# Patient Record
Sex: Male | Born: 1945
Health system: Southern US, Community
[De-identification: ages and names within clinical notes are randomized; demographics above are authoritative.]

## PROBLEM LIST (undated history)

## (undated) DIAGNOSIS — I639 Cerebral infarction, unspecified: Secondary | ICD-10-CM

## (undated) DIAGNOSIS — D696 Thrombocytopenia, unspecified: Secondary | ICD-10-CM

## (undated) DIAGNOSIS — K5900 Constipation, unspecified: Secondary | ICD-10-CM

## (undated) DIAGNOSIS — M2560 Stiffness of unspecified joint, not elsewhere classified: Secondary | ICD-10-CM

## (undated) DIAGNOSIS — D649 Anemia, unspecified: Secondary | ICD-10-CM

## (undated) DIAGNOSIS — R9431 Abnormal electrocardiogram [ECG] [EKG]: Secondary | ICD-10-CM

## (undated) DIAGNOSIS — D72819 Decreased white blood cell count, unspecified: Secondary | ICD-10-CM

## (undated) DIAGNOSIS — R768 Other specified abnormal immunological findings in serum: Secondary | ICD-10-CM

## (undated) DIAGNOSIS — M1990 Unspecified osteoarthritis, unspecified site: Secondary | ICD-10-CM

## (undated) DIAGNOSIS — I6350 Cerebral infarction due to unspecified occlusion or stenosis of unspecified cerebral artery: Secondary | ICD-10-CM

## (undated) DIAGNOSIS — I10 Essential (primary) hypertension: Secondary | ICD-10-CM

## (undated) DIAGNOSIS — I5189 Other ill-defined heart diseases: Secondary | ICD-10-CM

## (undated) HISTORY — DX: Cerebral infarction, unspecified: I63.9

## (undated) HISTORY — DX: Essential (primary) hypertension: I10

## (undated) HISTORY — DX: Unspecified osteoarthritis, unspecified site: M19.90

---

## 2004-05-23 ENCOUNTER — Emergency Department (HOSPITAL_COMMUNITY): Admission: EM | Admit: 2004-05-23 | Discharge: 2004-05-23 | Payer: Self-pay | Admitting: Emergency Medicine

## 2004-05-25 ENCOUNTER — Encounter (HOSPITAL_COMMUNITY): Admission: RE | Admit: 2004-05-25 | Discharge: 2004-06-24 | Payer: Self-pay | Admitting: Emergency Medicine

## 2004-06-01 ENCOUNTER — Emergency Department (HOSPITAL_COMMUNITY): Admission: EM | Admit: 2004-06-01 | Discharge: 2004-06-01 | Payer: Self-pay | Admitting: Emergency Medicine

## 2004-06-25 ENCOUNTER — Encounter (HOSPITAL_COMMUNITY): Admission: RE | Admit: 2004-06-25 | Discharge: 2004-07-25 | Payer: Self-pay | Admitting: Emergency Medicine

## 2009-02-17 ENCOUNTER — Emergency Department (HOSPITAL_COMMUNITY): Admission: EM | Admit: 2009-02-17 | Discharge: 2009-02-17 | Payer: Self-pay | Admitting: Emergency Medicine

## 2009-06-26 ENCOUNTER — Emergency Department (HOSPITAL_COMMUNITY): Admission: EM | Admit: 2009-06-26 | Discharge: 2009-06-26 | Payer: Self-pay | Admitting: Emergency Medicine

## 2009-08-14 ENCOUNTER — Emergency Department (HOSPITAL_COMMUNITY): Admission: EM | Admit: 2009-08-14 | Discharge: 2009-08-14 | Payer: Self-pay | Admitting: Emergency Medicine

## 2009-08-24 ENCOUNTER — Emergency Department (HOSPITAL_COMMUNITY): Admission: EM | Admit: 2009-08-24 | Discharge: 2009-08-24 | Payer: Self-pay | Admitting: Emergency Medicine

## 2010-01-04 ENCOUNTER — Ambulatory Visit: Payer: Self-pay | Admitting: Cardiology

## 2010-01-04 ENCOUNTER — Inpatient Hospital Stay (HOSPITAL_COMMUNITY)
Admission: EM | Admit: 2010-01-04 | Discharge: 2010-01-05 | Payer: Self-pay | Source: Home / Self Care | Admitting: Emergency Medicine

## 2010-06-01 ENCOUNTER — Ambulatory Visit: Payer: Self-pay | Admitting: Gastroenterology

## 2010-06-04 ENCOUNTER — Encounter: Payer: Self-pay | Admitting: Gastroenterology

## 2010-06-04 DIAGNOSIS — D649 Anemia, unspecified: Secondary | ICD-10-CM

## 2010-06-04 HISTORY — DX: Anemia, unspecified: D64.9

## 2010-07-04 ENCOUNTER — Ambulatory Visit (HOSPITAL_COMMUNITY)
Admission: RE | Admit: 2010-07-04 | Discharge: 2010-07-04 | Payer: Self-pay | Source: Home / Self Care | Admitting: Gastroenterology

## 2010-07-04 ENCOUNTER — Ambulatory Visit: Payer: Self-pay | Admitting: Gastroenterology

## 2010-09-06 NOTE — Assessment & Plan Note (Signed)
Summary: ANEMIA, Constipation   Visit Type:  Initial Consult Referring Danford Tat:  [facility] Damiyah Ditmars:  [facility]  Chief Complaint:  constipation.  History of Present Illness: Having back pain. BMs: every day, early in the AM. Now resolved. Doesn't eat alot of vegetables and prefers meat. No dysphagia, NV, abd pain, diarrhea, blood in stool, or black tarry stool. Appetite: good. Rare-hb/indigestion. No BC, Goody's, or Alve. May use Ibuprofen as needed for back pain.  Current Medications (verified): 1)  Accuretic 20-12.5 Mg Tabs (Quinapril-Hydrochlorothiazide) .... Take 1 Tablet By Mouth Once A Day 2)  Norvasc 5 Mg Tabs (Amlodipine Besylate) .... Take 1 Tablet By Mouth Once A Day 3)  Celebrex 200 Mg Caps (Celecoxib) .... Take 1 Tablet By Mouth Once A Day 4)  Aspirin 81 Mg Tbec (Aspirin) .... Take 1 Tablet By Mouth Once A Day  Allergies (verified): No Known Drug Allergies  Past History:  Past Medical History: Hypertension Arthritis Stroke  Family History: No FH of Colon Cancer or polyps  Social History: Applying for disablity Married: 2-3 years, but together for  ~40 years Smoke: < 1pk/day No EtOH, quit 2 years ago.  Review of Systems       JAN 2011: 188 LBS  FEB 2011: 183 LBS  JULY 2011: 196 LBS  AUG 2011: TSH 1.882    K 3.8  CA 9.3  ALB 4.0  HB 13.3 MCV 94.9    PLT 208 INR 14.1  CR 1.07 NI HFP  CHOL 175  HGA1C 5.9  PER HPI OTHERWISE ALL SYSTEMS NEGATIVE.  Vital Signs:  Patient profile:   65 year old male Height:      66 inches Weight:      206 pounds BMI:     33.37 Temp:     98.2 degrees F oral Pulse rate:   80 / minute BP sitting:   122 / 72  (left arm) Cuff size:   regular  Vitals Entered By: Cloria Spring LPN (June 01, 2010 1:58 PM)  Physical Exam  General:  Well developed, well nourished, no acute distress. Head:  Normocephalic and atraumatic. Eyes:  PERRL, no icterus. Mouth:  No deformity or lesions. Neck:  Supple; no  masses. Lungs:  Clear throughout to auscultation. Heart:  Regular rate and rhythm; no murmurs. Abdomen:  Soft, nontender and nondistended.  Normal bowel sounds. Extremities:  No edema noted. Neurologic:  Alert and  oriented x4;  grossly normal neurologically.  Impression & Recommendations:  Problem # 1:  ANEMIA (ICD-285.9) Assessment New Normocytic and taking NSAIDS. Differential diagnosis includes H. pylori or NSAID gastritis, colorectal polyp, and doubt PUD or colorectal CA. Pt has no insurance. Apply for [facility] discount. Schedule [facility]/? EGD after discount available. OPV after endo. TRILYTE PREP.  CC: PCP  Orders: Consultation Level III [phone])

## 2010-09-06 NOTE — Letter (Signed)
Summary: [facility] POSS EGD ORDER  [facility] POSS EGD ORDER   Imported By: Ave Filter 06/04/2010 11:18:25  _____________________________________________________________________  External Attachment:    Type:   Image     Comment:   External Document

## 2010-10-16 LAB — FERRITIN: Ferritin: 86 ng/mL (ref 22–322)

## 2010-10-21 LAB — BASIC METABOLIC PANEL
BUN: 10 mg/dL (ref 6–23)
CO2: 26 mEq/L (ref 19–32)
Calcium: 9.1 mg/dL (ref 8.4–10.5)
Chloride: 107 mEq/L (ref 96–112)
Creatinine, Ser: 0.89 mg/dL (ref 0.4–1.5)
GFR calc Af Amer: >60 mL/min (ref >60)
GFR calc non Af Amer: >60 mL/min (ref >60)
Glucose, Bld: 105 mg/dL — ABNORMAL HIGH (ref 70–99)
Potassium: 3.3 mEq/L — ABNORMAL LOW (ref 3.5–5.1)
Sodium: 139 mEq/L (ref 135–145)

## 2010-10-21 LAB — DIFFERENTIAL
Basophils Absolute: 0 10*3/uL (ref 0.0–0.1)
Basophils Relative: 0 % (ref 0–1)
Basophils Relative: 0 % (ref 0–1)
Eosinophils Absolute: 0 10*3/uL (ref 0.0–0.7)
Eosinophils Absolute: 0.1 10*3/uL (ref 0.0–0.7)
Eosinophils Relative: 1 % (ref 0–5)
Eosinophils Relative: 1 % (ref 0–5)
Lymphocytes Relative: 26 % (ref 12–46)
Lymphs Abs: 0.9 10*3/uL (ref 0.7–4.0)
Monocytes Absolute: 0.3 10*3/uL (ref 0.1–1.0)
Monocytes Absolute: 0.3 10*3/uL (ref 0.1–1.0)
Monocytes Relative: 9 % (ref 3–12)
Monocytes Relative: 7 % (ref 3–12)
Neutro Abs: 2.2 10*3/uL (ref 1.7–7.7)
Neutro Abs: 3.8 10*3/uL (ref 1.7–7.7)
Neutrophils Relative %: 63 % (ref 43–77)

## 2010-10-21 LAB — CBC
HCT: 38.6 % — ABNORMAL LOW (ref 39.0–52.0)
HCT: 39.4 % (ref 39.0–52.0)
Hemoglobin: 12.9 g/dL — ABNORMAL LOW (ref 13.0–17.0)
Hemoglobin: 13.3 g/dL (ref 13.0–17.0)
MCHC: 33.6 g/dL (ref 30.0–36.0)
MCHC: 33.7 g/dL (ref 30.0–36.0)
MCV: 91.7 fL (ref 78.0–100.0)
MCV: 92 fL (ref 78.0–100.0)
Platelets: 144 10*3/uL — ABNORMAL LOW (ref 150–400)
Platelets: 123 10*3/uL — ABNORMAL LOW (ref 150–400)
RBC: 4.21 MIL/uL — ABNORMAL LOW (ref 4.22–5.81)
RBC: 4.28 MIL/uL (ref 4.22–5.81)
RDW: 13.3 % (ref 11.5–15.5)
RDW: 13.3 % (ref 11.5–15.5)
WBC: 3.4 10*3/uL — ABNORMAL LOW (ref 4.0–10.5)
WBC: 4.7 10*3/uL (ref 4.0–10.5)

## 2010-10-21 LAB — COMPREHENSIVE METABOLIC PANEL
ALT: 18 U/L (ref 0–53)
AST: 27 U/L (ref 0–37)
Albumin: 3.3 g/dL — ABNORMAL LOW (ref 3.5–5.2)
Alkaline Phosphatase: 55 U/L (ref 39–117)
BUN: 10 mg/dL (ref 6–23)
CO2: 28 mEq/L (ref 19–32)
Calcium: 9.1 mg/dL (ref 8.4–10.5)
Chloride: 104 mEq/L (ref 96–112)
Creatinine, Ser: 1.1 mg/dL (ref 0.4–1.5)
GFR calc Af Amer: >60 mL/min (ref >60)
GFR calc non Af Amer: >60 mL/min (ref >60)
Glucose, Bld: 94 mg/dL (ref 70–99)
Potassium: 3.5 mEq/L (ref 3.5–5.1)
Sodium: 140 mEq/L (ref 135–145)
Total Bilirubin: 0.5 mg/dL (ref 0.3–1.2)
Total Protein: 7.8 g/dL (ref 6.0–8.3)

## 2010-10-21 LAB — URINE MICROSCOPIC-ADD ON

## 2010-10-21 LAB — URINALYSIS, ROUTINE W REFLEX MICROSCOPIC
Bilirubin Urine: NEGATIVE
Glucose, UA: NEGATIVE mg/dL
Ketones, ur: NEGATIVE mg/dL
Leukocytes, UA: NEGATIVE
Nitrite: NEGATIVE
Protein, ur: 30 mg/dL — AB
Specific Gravity, Urine: 1.025 (ref 1.005–1.030)
Urobilinogen, UA: 0.2 mg/dL (ref 0.0–1.0)
pH: 5.5 (ref 5.0–8.0)

## 2010-10-21 LAB — RAPID URINE DRUG SCREEN, HOSP PERFORMED
Amphetamines: NOT DETECTED
Barbiturates: NOT DETECTED
Benzodiazepines: NOT DETECTED
Cocaine: NOT DETECTED
Opiates: NOT DETECTED
Tetrahydrocannabinol: POSITIVE — AB

## 2010-10-21 LAB — ETHANOL: Alcohol, Ethyl (B): 33 mg/dL — ABNORMAL HIGH (ref 0–10)

## 2010-10-22 LAB — POCT CARDIAC MARKERS: Myoglobin, poc: 459 ng/mL (ref 12–200)

## 2010-10-22 LAB — LIPID PANEL: Triglycerides: 80 mg/dL (ref <150)

## 2010-10-22 LAB — DIFFERENTIAL
Basophils Relative: 0 % (ref 0–1)
Basophils Relative: 0 % (ref 0–1)
Eosinophils Absolute: 0.1 10*3/uL (ref 0.0–0.7)
Monocytes Relative: 10 % (ref 3–12)
Neutro Abs: 2.2 10*3/uL (ref 1.7–7.7)
Neutrophils Relative %: 57 % (ref 43–77)
Neutrophils Relative %: 58 % (ref 43–77)

## 2010-10-22 LAB — CBC
Hemoglobin: 12.9 g/dL — ABNORMAL LOW (ref 13.0–17.0)
MCHC: 33.7 g/dL (ref 30.0–36.0)
MCHC: 33.9 g/dL (ref 30.0–36.0)
Platelets: 151 10*3/uL (ref 150–400)
RBC: 4.01 MIL/uL — ABNORMAL LOW (ref 4.22–5.81)
RBC: 4.24 MIL/uL (ref 4.22–5.81)
WBC: 3.8 10*3/uL — ABNORMAL LOW (ref 4.0–10.5)

## 2010-10-22 LAB — HEMOGLOBIN A1C
Hgb A1c MFr Bld: 5.9 % — ABNORMAL HIGH (ref <5.7)
Mean Plasma Glucose: 123 mg/dL — ABNORMAL HIGH (ref <117)

## 2010-10-22 LAB — URINALYSIS, ROUTINE W REFLEX MICROSCOPIC
Glucose, UA: NEGATIVE mg/dL
Hgb urine dipstick: NEGATIVE
Ketones, ur: NEGATIVE mg/dL
pH: 5.5 (ref 5.0–8.0)

## 2010-10-22 LAB — COMPREHENSIVE METABOLIC PANEL
ALT: 18 U/L (ref 0–53)
Alkaline Phosphatase: 71 U/L (ref 39–117)
CO2: 27 mEq/L (ref 19–32)
GFR calc non Af Amer: 48 mL/min — ABNORMAL LOW (ref >60)
Glucose, Bld: 102 mg/dL — ABNORMAL HIGH (ref 70–99)
Potassium: 3.9 mEq/L (ref 3.5–5.1)
Sodium: 138 mEq/L (ref 135–145)

## 2010-10-22 LAB — CARDIAC PANEL(CRET KIN+CKTOT+MB+TROPI)
CK, MB: 6.2 ng/mL (ref 0.3–4.0)
CK, MB: 6.2 ng/mL (ref 0.3–4.0)
CK, MB: 6.6 ng/mL (ref 0.3–4.0)
Relative Index: 1.5 (ref 0.0–2.5)
Total CK: 410 U/L — ABNORMAL HIGH (ref 7–232)
Total CK: 418 U/L — ABNORMAL HIGH (ref 7–232)
Troponin I: 0.02 ng/mL (ref 0.00–0.06)

## 2010-10-22 LAB — BASIC METABOLIC PANEL
CO2: 25 mEq/L (ref 19–32)
Calcium: 9.3 mg/dL (ref 8.4–10.5)
Chloride: 103 mEq/L (ref 96–112)
GFR calc Af Amer: >60 mL/min (ref >60)
Sodium: 138 mEq/L (ref 135–145)

## 2010-10-22 LAB — BRAIN NATRIURETIC PEPTIDE: Pro B Natriuretic peptide (BNP): <30 pg/mL (ref 0.0–100.0)

## 2010-10-22 LAB — TSH: TSH: 2.942 u[IU]/mL (ref 0.350–4.500)

## 2010-12-25 ENCOUNTER — Encounter: Payer: Self-pay | Admitting: Gastroenterology

## 2011-01-02 ENCOUNTER — Ambulatory Visit (HOSPITAL_COMMUNITY)
Admission: RE | Admit: 2011-01-02 | Discharge: 2011-01-02 | Disposition: A | Discharge status: Home / Self Care | Payer: Non-veteran care | Source: Ambulatory Visit | Attending: Internal Medicine | Admitting: Internal Medicine

## 2011-01-02 DIAGNOSIS — R209 Unspecified disturbances of skin sensation: Secondary | ICD-10-CM | POA: Insufficient documentation

## 2011-01-02 DIAGNOSIS — R262 Difficulty in walking, not elsewhere classified: Secondary | ICD-10-CM | POA: Insufficient documentation

## 2011-01-02 DIAGNOSIS — M6281 Muscle weakness (generalized): Secondary | ICD-10-CM | POA: Insufficient documentation

## 2011-01-02 DIAGNOSIS — I69959 Hemiplegia and hemiparesis following unspecified cerebrovascular disease affecting unspecified side: Secondary | ICD-10-CM | POA: Insufficient documentation

## 2011-01-02 DIAGNOSIS — I10 Essential (primary) hypertension: Secondary | ICD-10-CM | POA: Insufficient documentation

## 2011-01-02 DIAGNOSIS — IMO0001 Reserved for inherently not codable concepts without codable children: Secondary | ICD-10-CM | POA: Insufficient documentation

## 2011-01-08 ENCOUNTER — Ambulatory Visit (HOSPITAL_COMMUNITY): Payer: Non-veteran care

## 2011-01-08 DIAGNOSIS — M6281 Muscle weakness (generalized): Secondary | ICD-10-CM | POA: Insufficient documentation

## 2011-01-08 DIAGNOSIS — R209 Unspecified disturbances of skin sensation: Secondary | ICD-10-CM | POA: Insufficient documentation

## 2011-01-08 DIAGNOSIS — IMO0001 Reserved for inherently not codable concepts without codable children: Secondary | ICD-10-CM | POA: Insufficient documentation

## 2011-01-08 DIAGNOSIS — I10 Essential (primary) hypertension: Secondary | ICD-10-CM | POA: Insufficient documentation

## 2011-01-08 DIAGNOSIS — I69959 Hemiplegia and hemiparesis following unspecified cerebrovascular disease affecting unspecified side: Secondary | ICD-10-CM | POA: Insufficient documentation

## 2011-01-08 DIAGNOSIS — R262 Difficulty in walking, not elsewhere classified: Secondary | ICD-10-CM | POA: Insufficient documentation

## 2011-01-09 ENCOUNTER — Ambulatory Visit: Payer: Self-pay | Admitting: Gastroenterology

## 2011-01-10 ENCOUNTER — Ambulatory Visit (HOSPITAL_COMMUNITY): Payer: Non-veteran care | Admitting: *Deleted

## 2011-01-10 ENCOUNTER — Ambulatory Visit (HOSPITAL_COMMUNITY): Payer: Non-veteran care | Admitting: Physical Therapy

## 2011-01-10 ENCOUNTER — Ambulatory Visit (HOSPITAL_COMMUNITY): Payer: Non-veteran care | Admitting: Speech Pathology

## 2011-01-15 ENCOUNTER — Ambulatory Visit (HOSPITAL_COMMUNITY): Payer: Non-veteran care | Admitting: *Deleted

## 2011-01-17 ENCOUNTER — Ambulatory Visit (HOSPITAL_COMMUNITY)
Admission: RE | Admit: 2011-01-17 | Discharge: 2011-01-17 | Disposition: A | Discharge status: Home / Self Care | Payer: Non-veteran care | Source: Ambulatory Visit | Attending: Internal Medicine | Admitting: Internal Medicine

## 2011-01-23 ENCOUNTER — Ambulatory Visit (HOSPITAL_COMMUNITY)
Admission: RE | Admit: 2011-01-23 | Discharge: 2011-01-23 | Disposition: A | Discharge status: Home / Self Care | Payer: Non-veteran care | Source: Ambulatory Visit | Attending: *Deleted | Admitting: *Deleted

## 2011-01-25 ENCOUNTER — Ambulatory Visit (HOSPITAL_COMMUNITY): Payer: Non-veteran care

## 2011-01-28 ENCOUNTER — Ambulatory Visit (HOSPITAL_COMMUNITY): Payer: Non-veteran care | Admitting: Physical Therapy

## 2011-01-28 ENCOUNTER — Ambulatory Visit (HOSPITAL_COMMUNITY): Payer: Non-veteran care | Attending: Internal Medicine | Admitting: Physical Therapy

## 2011-01-28 DIAGNOSIS — R262 Difficulty in walking, not elsewhere classified: Secondary | ICD-10-CM | POA: Insufficient documentation

## 2011-01-28 DIAGNOSIS — M6281 Muscle weakness (generalized): Secondary | ICD-10-CM | POA: Insufficient documentation

## 2011-01-28 DIAGNOSIS — I69959 Hemiplegia and hemiparesis following unspecified cerebrovascular disease affecting unspecified side: Secondary | ICD-10-CM | POA: Insufficient documentation

## 2011-01-28 DIAGNOSIS — IMO0001 Reserved for inherently not codable concepts without codable children: Secondary | ICD-10-CM | POA: Insufficient documentation

## 2011-01-28 DIAGNOSIS — R209 Unspecified disturbances of skin sensation: Secondary | ICD-10-CM | POA: Insufficient documentation

## 2011-01-28 DIAGNOSIS — I10 Essential (primary) hypertension: Secondary | ICD-10-CM | POA: Insufficient documentation

## 2011-01-31 ENCOUNTER — Ambulatory Visit (HOSPITAL_COMMUNITY): Payer: Non-veteran care | Admitting: *Deleted

## 2011-02-04 ENCOUNTER — Ambulatory Visit (HOSPITAL_COMMUNITY): Payer: Non-veteran care

## 2011-02-04 DIAGNOSIS — R209 Unspecified disturbances of skin sensation: Secondary | ICD-10-CM | POA: Insufficient documentation

## 2011-02-04 DIAGNOSIS — I10 Essential (primary) hypertension: Secondary | ICD-10-CM | POA: Insufficient documentation

## 2011-02-04 DIAGNOSIS — R262 Difficulty in walking, not elsewhere classified: Secondary | ICD-10-CM | POA: Insufficient documentation

## 2011-02-04 DIAGNOSIS — IMO0001 Reserved for inherently not codable concepts without codable children: Secondary | ICD-10-CM | POA: Insufficient documentation

## 2011-02-04 DIAGNOSIS — I69959 Hemiplegia and hemiparesis following unspecified cerebrovascular disease affecting unspecified side: Secondary | ICD-10-CM | POA: Insufficient documentation

## 2011-02-04 DIAGNOSIS — M6281 Muscle weakness (generalized): Secondary | ICD-10-CM | POA: Insufficient documentation

## 2011-02-08 ENCOUNTER — Ambulatory Visit (HOSPITAL_COMMUNITY): Payer: Non-veteran care | Admitting: Physical Therapy

## 2011-02-11 ENCOUNTER — Ambulatory Visit (HOSPITAL_COMMUNITY)
Admission: RE | Admit: 2011-02-11 | Discharge: 2011-02-11 | Disposition: A | Discharge status: Home / Self Care | Payer: Non-veteran care | Source: Ambulatory Visit | Attending: Internal Medicine | Admitting: Internal Medicine

## 2011-02-14 ENCOUNTER — Ambulatory Visit (HOSPITAL_COMMUNITY): Payer: Non-veteran care

## 2011-07-15 ENCOUNTER — Ambulatory Visit (HOSPITAL_COMMUNITY)
Admission: RE | Admit: 2011-07-15 | Discharge: 2011-07-15 | Disposition: A | Discharge status: Home / Self Care | Payer: Self-pay | Source: Ambulatory Visit | Attending: General Practice | Admitting: General Practice

## 2011-07-15 DIAGNOSIS — I69998 Other sequelae following unspecified cerebrovascular disease: Secondary | ICD-10-CM | POA: Insufficient documentation

## 2011-07-15 DIAGNOSIS — M6281 Muscle weakness (generalized): Secondary | ICD-10-CM | POA: Insufficient documentation

## 2011-07-15 DIAGNOSIS — I6350 Cerebral infarction due to unspecified occlusion or stenosis of unspecified cerebral artery: Secondary | ICD-10-CM

## 2011-07-15 DIAGNOSIS — Z5189 Encounter for other specified aftercare: Secondary | ICD-10-CM | POA: Insufficient documentation

## 2011-07-15 DIAGNOSIS — R279 Unspecified lack of coordination: Secondary | ICD-10-CM | POA: Insufficient documentation

## 2011-07-15 HISTORY — DX: Cerebral infarction due to unspecified occlusion or stenosis of unspecified cerebral artery: I63.50

## 2011-07-15 NOTE — Progress Notes (Signed)
Physical Therapy Evaluation  Patient Details  Name: Eric [name] MRN: [id number] Date of Birth: 1945/08/18  Today's Date: 07/15/2011 Time: 5784-6962 Time Calculation (min): 39 min Visit#: 1  of 8   Re-eval: 08/14/11 Assessment Diagnosis: CVA  Past Medical History:  Past Medical History  Diagnosis Date  . Stroke   . Arthritis   . HTN (hypertension)    Past Surgical History: No past surgical history on file.  Subjective Symptoms/Limitations Symptoms: S:  I can't write with my right hand anymore.  How long can you sit comfortably?: no problem How long can you stand comfortably?: the patient states that he is unsure of how long he is able to stand  due to his middle toe beginnning to hurt. How long can you walk comfortably?: The patient states that he is able to walk for ten minutes before he needs to sit down Pain Assessment Currently in Pain?: Yes Pain Score:   9 Pain Location: Toe (Comment which one) Pain Orientation: Left Pain Type: Chronic pain  Precautions/Restrictions     Prior Functioning  Home Living Lives With: Spouse  Sensation/Coordination/Flexibility Sensation Light Touch: Appears Intact Coordination Gross Motor Movements are Fluid and Coordinated:  (slightly decreased in BUE.) Fine Motor Movements are Fluid and Coordinated:  (assessed with NHPT, R:  OT handed pegs 4'02", L:  2'06")  Assessment  RLE Strength Right Hip Flexion: 5/5 Right Hip Extension: 3+/5 Right Hip ABduction: 5/5 Right Hip ADduction: 5/5 Right Knee Flexion: 5/5 Right Knee Extension: 5/5 Right Ankle Dorsiflexion: 5/5 Right Ankle Plantar Flexion: 3/5 LLE Strength Left Hip Flexion: 4/5 Left Hip Extension: 4/5 Left Hip ABduction: 5/5 Left Hip ADduction: 3-/5 Left Knee Flexion: 3+/5 Left Knee Extension: 3+/5 Left Ankle Dorsiflexion: 3+/5 Left Ankle Plantar Flexion: 2/5  Berg Balance test:   37/56-risk for falls  Exercise/Treatments  HEP Physical Therapy Assessment and  Plan PT Assessment and Plan Clinical Impression Statement: Pt with stiffness in his back, strength and balance deficits leading to an abnormal gait pattern who will benefit from skilled PT to improve his gait pattern and reduce his risk of falling.   Rehab Potential: Good Clinical Impairments Affecting Rehab Potential: stiffness, weakness of L LE, balance deficits. PT Frequency: Min 2X/week PT Duration: 4 weeks PT Treatment/Interventions: Gait training;Neuromuscular re-education;Balance training PT Plan: begin bike, high marching, heel toe gt, retro gt, SLS, functional squatting, heel raise and cone rotation next treatment.    Goals Home Exercise Program Pt will Perform Home Exercise Program: Independently PT Short Term Goals Time to Complete Short Term Goals: 2 weeks PT Short Term Goal 1: Pt Berg balance score will increase by 3 to allow improved safety PT Short Term Goal 2: Pt to be able to ambulate for 15 minutes without difficulty PT Long Term Goals Time to Complete Long Term Goals: 4 weeks PT Long Term Goal 1: mm strength increased by 1 grade PT Long Term Goal 2: berb balance score increased by 6 to allow more stable ambulation  Problem List Patient Active Problem List  Diagnoses  . ANEMIA  . Unspecified cerebral artery occlusion with cerebral infarction  . Lack of coordination    PT - End of Session Activity Tolerance: Patient tolerated treatment well General Behavior During Session: [facility] for tasks performed Cognition: [facility] for tasks performed   RUSSELL,CINDY 07/15/2011, 12:23 PM  Physician Documentation Your signature is required to indicate approval of the treatment plan as stated above.  Please sign and either send electronically or make a copy of  this report for your files and return this physician signed original.   Please mark one 1.__approve of plan  2. ___approve of plan with the following conditions.   ______________________________                                                           _____________________ Physician Signature                                                                                                             Date

## 2011-07-15 NOTE — Progress Notes (Signed)
Occupational Therapy Evaluation  Patient Details  Name: Eric [name] MRN: [id number] Date of Birth: May 29, 1946  Today's Date: 07/15/2011 Time: 4098-1191 Time Calculation (min): 42 min OT Eval 42' Visit#: 1  of 16   Re-eval: 08/12/11     Past Medical History:  Past Medical History  Diagnosis Date  . Stroke   . Arthritis   . HTN (hypertension)    Past Surgical History: No past surgical history on file.  Subjective Symptoms/Limitations Symptoms: S:  I can't write with my right hand anymore.  Limitations: History: Mr. Doshi states that he had a CVA with right hand weakness approximately 2 years ago.  He received PT for a short time earlier this year, but states he has had no other therapy.  He is having difficulty with his balance, and with use of his domiant right hand with daily activities.  He has been referred to occupational therapy for evaluation and treatment. Pain Assessment Currently in Pain?: Yes Pain Score:   9 Pain Location: Toe (Comment which one) Pain Orientation: Left Pain Type: Chronic pain  Precautions/Restrictions     Prior Functioning  Home Living Lives With: Spouse  Assessment Cognition Overall Cognitive Status: Appears within functional limits for tasks assessed Sensation Light Touch: Appears Intact RUE AROM (degrees) Right Forearm Pronation  0-80-90: 60 Degrees Right Forearm Supination  0-80-90: 60 Degrees Right Wrist Extension 0-70: 5 Degrees Right Wrist Flexion 0-80: 0 Degrees Right Composite Finger Flexion:  (T 40/0, I 70/60/40, L 72/74/62, R 76/8062, S 76/80/70) RUE Strength RUE Overall Strength:  (strength not assessed this date) Grip (lbs): 30 (left 63) Lateral Pinch: 8 lbs (left 19) 3 Point Pinch: 4 lbs (left 14)  Exercise/Treatments     Occupational Therapy Assessment and Plan OT Assessment and Plan Clinical Impression Statement: A:  Patient presents with decreased function in right hand s/p CVA.   Rehab Potential: Good OT  Frequency: Min 2X/week OT Duration: 8 weeks OT Treatment/Interventions: Self-care/ADL training;Therapeutic exercise;Neuromuscular education;Therapeutic activities;Manual therapy;Patient/family education;Other (comment) (Modalities PRN, HEP:  FMC training, tputty) OT Plan: P:  Skilled OT intervention to increase I with use of RUE as dominant with ADLs.  Treatment Plan:  Passive stretching to improve mobility in forearm, wrist, and hand.  Neuro re-ed to normalize tone in RUE, grasp and release training, coordination training.   Goals Short Term Goals Time to Complete Short Term Goals: 4 weeks Short Term Goal 1: Patient will be educated on HEP. Short Term Goal 2: Patient will increase right forearm, hand, and wrist AROM by 10 for increased ability to use RUE with functional tasks. Short Term Goal 3: Patient will increase right grip strength by 10 pounds and pinch strength by 2 pounds for increased ability to open containers with right hand. Short Term Goal 4: Patient will complete Nine Hole Peg Test in standardized fashion, to increase in hand manipulation skills in right hand. Short Term Goal 5: Patient will increase right upper extremity strength to 4/5. Long Term Goals Time to Complete Long Term Goals: 8 weeks Long Term Goal 1: Patient will use RUE as an active assist with all B/IADLs and leisure activities. Long Term Goal 2: Patient will increase RUE AROM by 20 for increased ability to use RUE as an active assist. Long Term Goal 3: Patient will increase RUE strength to 4+/5 for increased ability to transfer in and out of shower. Long Term Goal 4: Patient will increase right grip strength by 20 pounds and pinch strength by 4 pounds for  increased ability to open containers. Long Term Goal 5: Patient will increase right hand coordination by completing NHPT in less than 2 min. End of Session Patient Active Problem List  Diagnoses  . ANEMIA  . Unspecified cerebral artery occlusion with cerebral  infarction  . Lack of coordination   End of Session Activity Tolerance: Patient tolerated treatment well General Behavior During Session: [facility] for tasks performed Cognition: [facility] for tasks performed  Time Calculation Start Time: 1023 Stop Time: 1105 Time Calculation (min): 42 min  Shirlean Mylar, OTR/L  07/15/2011, 11:52 AM  Physician Documentation Your signature is required to indicate approval of the treatment plan as stated above.  Please sign and either send electronically or make a copy of this report for your files and return this physician signed original.  Please mark one 1.__approve of plan  2. ___approve of plan with the following conditions.   ______________________________                                                          _____________________ Physician Signature                                                                                                             Date

## 2011-07-15 NOTE — Patient Instructions (Addendum)
HEP for strengthening

## 2012-03-10 ENCOUNTER — Ambulatory Visit (HOSPITAL_COMMUNITY)
Admission: RE | Admit: 2012-03-10 | Discharge: 2012-03-10 | Disposition: A | Discharge status: Home / Self Care | Payer: Non-veteran care | Source: Ambulatory Visit | Attending: Internal Medicine | Admitting: Internal Medicine

## 2012-03-10 DIAGNOSIS — M6281 Muscle weakness (generalized): Secondary | ICD-10-CM | POA: Insufficient documentation

## 2012-03-10 DIAGNOSIS — R279 Unspecified lack of coordination: Secondary | ICD-10-CM | POA: Insufficient documentation

## 2012-03-10 DIAGNOSIS — IMO0001 Reserved for inherently not codable concepts without codable children: Secondary | ICD-10-CM | POA: Insufficient documentation

## 2012-03-10 DIAGNOSIS — M25569 Pain in unspecified knee: Secondary | ICD-10-CM | POA: Insufficient documentation

## 2012-03-10 DIAGNOSIS — R269 Unspecified abnormalities of gait and mobility: Secondary | ICD-10-CM | POA: Insufficient documentation

## 2012-03-10 NOTE — Evaluation (Signed)
Physical Therapy Evaluation  Patient Details  Name: Eric [name] MRN: [id number] Date of Birth: [birth year]  Today's Date: 03/10/2012 Time: 7829-5621 PT Time Calculation (min): 59 min  Visit#: 1  of 12   Re-eval: 04/09/12 Assessment Diagnosis: CVA  Authorization: [facility]  Authorization Time Period: 02/25/12 -05/04/12  Authorization Visit#: 1  of 12    Past Medical History:  Past Medical History  Diagnosis Date  . Stroke   . Arthritis   . HTN (hypertension)    Past Surgical History: No past surgical history on file.  Subjective Symptoms/Limitations Symptoms: Mr. Whittenberg has been referred from the [place] for therapy.  He suffered a stroke two years ago and has never had any therapy.  He did come for an evaluation in December but he was not able to return for any treatment sessions.  Mr. Glatfelter states his main concern is his inability to use his R hand.  He also knows he has R leg weakness, howeve, this is not as much as a concern for hiim at this point.  He states that he broke his R second toe long ago and it did not heal right.  Currently the arthirtis is so bad that he is going to have his toe amputated in September.  We will see Mr. Heeter to attempt to imrove his functioning ability. How long can you stand comfortably?: The patient is only able to stand for 5-10 minutes. How long can you walk comfortably?: The patient is only able to walk for five to ten minutes. Pain Assessment Currently in Pain?: Yes Pain Score: 10-Worst pain ever (Pt states it hurts enough that he is going to amputate it.) Pain Location: Toe (Comment which one) (L second toe) Pain Orientation: Left Pain Type: Chronic pain    Prior Functioning  Home Living Lives With: Spouse  Cognition/Observation Cognition Overall Cognitive Status: Appears within functional limits for tasks assessed   Assessment RUE Strength Right Shoulder Flexion: 4/5 Right Shoulder ABduction: 4/5 Right Shoulder Internal Rotation:  4/5 Right Shoulder External Rotation: 3-/5 Right Elbow Flexion: 5/5 Right Elbow Extension: 5/5 Right Forearm Pronation: 2-/5 Right Forearm Supination: 2-/5 Right Wrist Flexion:  (1+/5) Right Wrist Extension:  (1+/5) Grip (lbs): 45 RLE Strength Right Hip Flexion: 5/5 Right Hip Extension: 3+/5 Right Hip ABduction: 5/5 Right Hip ADduction: 5/5 Right Knee Flexion: 5/5 Right Knee Extension: 5/5 Right Ankle Dorsiflexion: 5/5 Right Ankle Plantar Flexion: 3/5 LLE Strength Left Hip Flexion: 3+/5 Left Hip Extension: 4/5 Left Hip ABduction: 4/5 Left Hip ADduction: 2-/5 Left Knee Flexion: 4/5 Left Knee Extension: 4/5 Left Ankle Dorsiflexion: 5/5 Left Ankle Plantar Flexion: 2/5  Exercise/Treatments Mobility/Balance  Berg Balance Test Sit to Stand: Able to stand without using hands and stabilize independently Standing Unsupported: Able to stand safely 2 minutes Sitting with Back Unsupported but Feet Supported on Floor or Stool: Able to sit safely and securely 2 minutes Stand to Sit: Sits safely with minimal use of hands Transfers: Able to transfer safely, definite need of hands Standing Unsupported with Eyes Closed: Able to stand 10 seconds with supervision Standing Ubsupported with Feet Together: Able to place feet together independently and stand for 1 minute with supervision From Standing, Reach Forward with Outstretched Arm: Can reach forward >5 cm safely (2") From Standing Position, Pick up Object from Floor: Able to pick up shoe, needs supervision From Standing Position, Turn to Look Behind Over each Shoulder: Turn sideways only but maintains balance Turn 360 Degrees: Able to turn 360 degrees safely but  slowly Standing Unsupported, Alternately Place Feet on Step/Stool: Able to stand independently and complete 8 steps >20 seconds Standing Unsupported, One Foot in Front: Able to take small step independently and hold 30 seconds Standing on One Leg: Tries to lift leg/unable to hold 3  seconds but remains standing independently Total Score: 40      Seated  AA pronation/supination; wrist flex/ext in neutral position; wrist ulnar/radial deviation x 10; finger opposition. (located in doc flow sheet under hand ex.) Supine Hip Adduction Isometric: 5 reps Bridges: 5 reps Straight Leg Raises: Strengthening;Right;5 reps    Physical Therapy Assessment and Plan PT Assessment and Plan Clinical Impression Statement: S/P CVA with stiffness and weakness of L elbow/wrist/ and hand as well as weakness of R LE and balance deficits who will benefit from skilled PT to improve function and decrease risk of falling. Pt will benefit from skilled therapeutic intervention in order to improve on the following deficits: Decreased activity tolerance;Decreased balance;Decreased coordination;Decreased range of motion;Decreased strength;Impaired flexibility;Pain Rehab Potential: Fair PT Frequency: Min 2X/week PT Duration: 6 weeks PT Treatment/Interventions: Gait training;Functional mobility training;Therapeutic activities;Therapeutic exercise PT Plan: begin AAROM for pronation/supination; wrist motion and hand motion; putty for flattening, rolling; sit to stand; SLS, cone rotation and gait with cane.     Goals Home Exercise Program Pt will Perform Home Exercise Program: Independently PT Short Term Goals Time to Complete Short Term Goals: 3 weeks PT Short Term Goal 1: Pt strength to increase by 1/2 grade. PT Short Term Goal 2: Pt to ambulate with a cane to improve safety. PT Long Term Goals Time to Complete Long Term Goals:  (6 weeks) PT Long Term Goal 1: improve mm grade by one level. PT Long Term Goal 2: Berg score improved by 6 to no longer need a cane. Long Term Goal 3: Pt to be able to supinate 30 degrees. Long Term Goal 4: Pt to states 30% more use of hand  Problem List Patient Active Problem List  Diagnosis  . ANEMIA  . Unspecified cerebral artery occlusion with cerebral infarction   . Lack of coordination    PT - End of Session Equipment Utilized During Treatment: Gait belt Activity Tolerance: Patient tolerated treatment well General Behavior During Session: [facility] for tasks performed Cognition: [facility] for tasks performed PT Plan of Care PT Home Exercise Plan: given Consulted and Agree with Plan of Care: Patient  GP    RUSSELL,CINDY 03/10/2012, 4:03 PM  Physician Documentation Your signature is required to indicate approval of the treatment plan as stated above.  Please sign and either send electronically or make a copy of this report for your files and return this physician signed original.   Please mark one 1.__approve of plan  2. ___approve of plan with the following conditions.   ______________________________                                                          _____________________ Physician Signature  Date  

## 2012-03-16 ENCOUNTER — Ambulatory Visit (HOSPITAL_COMMUNITY)
Admission: RE | Admit: 2012-03-16 | Discharge: 2012-03-16 | Disposition: A | Discharge status: Home / Self Care | Payer: Non-veteran care | Source: Ambulatory Visit | Attending: *Deleted | Admitting: *Deleted

## 2012-03-16 NOTE — Progress Notes (Signed)
Physical Therapy Treatment Patient Details  Name: THANIEL COLUCCIO MRN: [id number] Date of Birth: 1946/07/28  Today's Date: 03/16/2012 Time: 1521-1608 PT Time Calculation (min): 47 min  Visit#: 2  of 12   Re-eval: 04/09/12    Authorization: [facility] 12 visits  Authorization Time Period:    Authorization Visit#: 2  of 12    Subjective: Symptoms/Limitations Symptoms: Pt states that  he has been doing his exercises at home.    Exercise/Treatments   Balance Exercises Standing SLS: Eyes open;3 reps;Limitations SLS Limitations: max R 5"; L 5" Cone Rotation: Foam;Right turn;Left turn Other Standing Exercises: Gt with cane.  Yoga Poses    Seated Other Seated Exercises: sit to stand x 30 seconds.; UBE backward x 3' Other Seated Exercises: 1# sup/pronation; wrist neutral flex/ext; putty flatten,roll, pinch;shld flex/ab x 10       Physical Therapy Assessment and Plan PT Assessment and Plan Clinical Impression Statement: Pt challenged with cone rotation with UE but able to do with verbal encouragment. Gt safer with cane. PT Plan: begin marching in place; wall bumps next treatment; 4th treatment try rockerboard.    Goals    Problem List Patient Active Problem List  Diagnosis  . ANEMIA  . Unspecified cerebral artery occlusion with cerebral infarction  . Lack of coordination    PT - End of Session Activity Tolerance: Patient tolerated treatment well  GP    Zionah Criswell,CINDY 03/16/2012, 4:10 PM

## 2012-03-18 ENCOUNTER — Ambulatory Visit (HOSPITAL_COMMUNITY)
Admission: RE | Admit: 2012-03-18 | Discharge: 2012-03-18 | Disposition: A | Discharge status: Home / Self Care | Payer: Non-veteran care | Source: Ambulatory Visit | Attending: *Deleted | Admitting: *Deleted

## 2012-03-18 NOTE — Progress Notes (Signed)
Physical Therapy Treatment Patient Details  Name: Eric [name] MRN: [id number] Date of Birth: [birth year]  Today's Date: 03/18/2012 Time: 4098-1191 PT Time Calculation (min): 44 min  Visit#: 3  of 12   Re-eval: 04/09/12 Charges: NMR x 15' Therex x 23'  Authorization: [facility] 12 visits  Authorization Visit#: 3  of 12    Subjective: Symptoms/Limitations Symptoms: Pt states that he is eager to get stronger. Pain Assessment Currently in Pain?: No/denies Pain Score: 0-No pain   Exercise/Treatments Standing Wall Bumps: 10 reps;Shoulder;Hips;Eyes opened Marching: Solid surface;10 reps  Seated Other Seated Exercises: UBE backward x 4'@1 .0 Other Seated Exercises: 1# sup/pronation; wrist neutral flex/ext; putty flatten,roll, pinch;shld flex/ab x 10   Physical Therapy Assessment and Plan PT Assessment and Plan Clinical Impression Statement: Pt requires multimodal cueing with wrist and hand therex for proper motion. Pt complete NMR exercises well after demonstration. Pt is without complaint reports 0/10 pain at end of session.  PT Plan: Continue per PT POC. Begin rockerboard next session.     Problem List Patient Active Problem List  Diagnosis  . ANEMIA  . Unspecified cerebral artery occlusion with cerebral infarction  . Lack of coordination    PT - End of Session Activity Tolerance: Patient tolerated treatment well General Behavior During Session: [facility] for tasks performed Cognition: [facility] for tasks performed   Seth Bake, PTA 03/18/2012, 5:00 PM

## 2012-03-20 ENCOUNTER — Ambulatory Visit (HOSPITAL_COMMUNITY)
Admission: RE | Admit: 2012-03-20 | Discharge: 2012-03-20 | Disposition: A | Discharge status: Home / Self Care | Payer: Non-veteran care | Source: Ambulatory Visit

## 2012-03-20 NOTE — Progress Notes (Signed)
Physical Therapy Treatment Patient Details  Name: Eric [name] MRN: [id number] Date of Birth: February 07, 1946  Today's Date: 03/20/2012 Time: 1455-1550 PT Time Calculation (min): 55 min  Visit#: 4  of 12   Re-eval: 04/09/12  Charge: Gait 10', NMR 23', therex 22  Authorization: [facility] 12 visits  Authorization Time Period: 02/25/2012-05/04/2012  Authorization Visit#: 4  of 12    Subjective: Symptoms/Limitations Symptoms: My knees are crunching, high pain scale with flexion.  Pt stated he is feeling stronger.  Pt has difficulty putting on collar around dog's neck, hands feel stiff. Pain Assessment Currently in Pain?: Yes Pain Score:   7 Pain Location: Knee Pain Orientation: Right;Left  Objective:   Exercise/Treatments Balance Exercises Standing SLS: Eyes open;3 reps;Time SLS Time: L 12", R 10" Wall Bumps: 10 reps;Shoulder;Hips;Eyes opened Cone Rotation: Foam;Right turn;Left turn Marching: Solid surface;10 reps;Limitations Marching Limitations: no HHA Other Standing Exercises: Gait with no AD, 4 RT down carpet  Other Standing Exercises: Rocker board   Seated Other Seated Exercises: UBE backward x 5'@1 .0 Other Seated Exercises: 10 STS without HHA, cueing to control descent   Physical Therapy Assessment and Plan PT Assessment and Plan Clinical Impression Statement: Began rocker board to equalize weight distribution.  Pt improved gait mechanics following training following verbal cueing for improved posture, heel to toe and equal stride length.   PT Plan: Continue per PT POC.    Goals    Problem List Patient Active Problem List  Diagnosis  . ANEMIA  . Unspecified cerebral artery occlusion with cerebral infarction  . Lack of coordination    PT - End of Session Equipment Utilized During Treatment: Gait belt Activity Tolerance: Patient tolerated treatment well General Behavior During Session: [facility] for tasks performed Cognition: [facility] for tasks performed  GP     Juel Burrow 03/20/2012, 3:58 PM

## 2012-03-24 ENCOUNTER — Ambulatory Visit (HOSPITAL_COMMUNITY)
Admission: RE | Admit: 2012-03-24 | Discharge: 2012-03-24 | Disposition: A | Discharge status: Home / Self Care | Payer: Non-veteran care | Source: Ambulatory Visit | Attending: *Deleted | Admitting: *Deleted

## 2012-03-24 NOTE — Progress Notes (Signed)
Physical Therapy Treatment Patient Details  Name: Eric [name] MRN: [id number] Date of Birth: Jan 12, 1946  Today's Date: 03/24/2012 Time: 6578-4696 PT Time Calculation (min): 42 min Charges:  NMR 40'  Visit#: 5  of 12   Re-eval: 04/09/12 [facility]ization: [facility] 12 [facility] [place]  Authorization Visit#: 5  of 12    Subjective: Symptoms/Limitations Symptoms: Pt. states he's feeling about the same. Pain Assessment Currently in Pain?: Yes Pain Score:   9 Pain Location: Knee Pain Orientation: Right;Left   Exercise/Treatments Balance Exercises Standing SLS: Eyes open;Foam;3 reps;Time SLS Time: L 8", R 8" Balance Beam: side step 1RT, unable tandem Tandem Gait: 1 rep Retro Gait: 1 rep Sidestepping: 1 rep Marching: Solid surface;10 reps;Limitations Marching Limitations: no HHA Other Standing Exercises: Rocker board  Seated Other Seated Exercises: UBE backward x 5'@1 .0 Other Seated Exercises: 10 STS without HHA, cueing to control descent    Physical Therapy Assessment and Plan PT Assessment and Plan Clinical Impression Statement: Began dynamic balance activities; Pt. with most difficulty performing tandem balance, loosing balance several times requiring mod assist to correct.  Pt. required 3 rest breaks during treatment today. PT Plan: Continue to progress toward goals.     Problem List Patient Active Problem List  Diagnosis  . ANEMIA  . Unspecified cerebral artery occlusion with cerebral infarction  . Lack of coordination    PT - End of Session Equipment Utilized During Treatment: Gait belt Activity Tolerance: Patient tolerated treatment well General Behavior During Session: [facility] for tasks performed Cognition: [facility] for tasks performed   Lurena Nida, PTA/CLT 03/24/2012, 5:31 PM

## 2012-03-26 ENCOUNTER — Ambulatory Visit (HOSPITAL_COMMUNITY)
Admission: RE | Admit: 2012-03-26 | Discharge: 2012-03-26 | Disposition: A | Discharge status: Home / Self Care | Payer: Non-veteran care | Source: Ambulatory Visit | Attending: *Deleted | Admitting: *Deleted

## 2012-03-26 NOTE — Progress Notes (Signed)
Physical Therapy Treatment Patient Details  Name: Eric [name] MRN: [id number] Date of Birth: [birth year]  Today's Date: 03/26/2012 Time: 1510-1555 PT Time Calculation (min): 45 min  Visit#: 6  of 12   Re-eval: 04/09/12 Charges: Gait x 12' NMR x 26'  [facility]ization: [facility] 12 [facility] [place]  Authorization Time Period:    Authorization Visit#: 6  of 12    Subjective: Symptoms/Limitations Symptoms: "I'm disgusted with my body." Pt is frustrated with his weaknesses but verbalizes that he is hopeful that therapy will help. Pain Assessment Currently in Pain?: No/denies Pain Score: 0-No pain   Exercise/Treatments Balance Exercises Standing SLS: Eyes open;3 reps;Time SLS Time: L 10", R 13" Tandem Gait: 1 rep Marching: Solid surface;10 reps;Limitations Marching Limitations: no HHA Other Standing Exercises: Gait training on TM 1x4' 1x3' working on heel toe pattern and stride length; Gait training in dept with SPC x 5' Other Standing Exercises: Rocker board 2' A/P A/L  Seated Other Seated Exercises: UBE backward x 5'@3 .0   Physical Therapy Assessment and Plan PT Assessment and Plan Clinical Impression Statement: Began gait training on TM to improve stride length and heel toe pattern. Pt requires constant multimodal cueing for proper gait mechanics on TM. Gait training also completed with SPC. Pt does not like the idea of using a cane but his gait mechanics improve when using it. Pt reports no increase in pain at end of session. PT Plan: Continue to progress toward goals per PT POC.     Problem List Patient Active Problem List  Diagnosis  . ANEMIA  . Unspecified cerebral artery occlusion with cerebral infarction  . Lack of coordination    PT - End of Session Equipment Utilized During Treatment: Gait belt Activity Tolerance: Patient tolerated treatment well General Behavior During Session: [facility] for tasks performed Cognition: [facility] for tasks performed  Seth Bake,  PTA 03/26/2012, 4:13 PM

## 2012-03-31 ENCOUNTER — Ambulatory Visit (HOSPITAL_COMMUNITY)
Admission: RE | Admit: 2012-03-31 | Discharge: 2012-03-31 | Disposition: A | Discharge status: Home / Self Care | Payer: Non-veteran care | Source: Ambulatory Visit | Attending: *Deleted | Admitting: *Deleted

## 2012-03-31 NOTE — Progress Notes (Addendum)
Physical Therapy Treatment Patient Details  Name: Eric [name] MRN: [id number] Date of Birth: Apr 30, 1946  Today's Date: 03/31/2012 Time: 6213-0865 PT Time Calculation (min): 54 min  Visit#: 7  of 12   Re-eval: 04/09/12    Authorization:    [facility] Subjective: Symptoms/Limitations Symptoms: No complaint    Exercise/Treatments      Balance Exercises Standing Tandem Gait: 1 rep Retro Gait: 1 rep Heel Raises: 15 reps;Limitations Heel Raises Limitations: no hand hold Other Standing Exercises: minisquat no hand hold  UE AAROM for all shoulder,wrist, hand and elbow pronation with Pt completing majority with shoulder; 50/50 with wrist and 20% with hand.    Problem List Patient Active Problem List  Diagnosis  . ANEMIA  . Unspecified cerebral artery occlusion with cerebral infarction  . Lack of coordination   Asssessment:  Slow gains secondary to CVA being in remote past.   PT - End of Session Equipment Utilized During Treatment: Gait belt Activity Tolerance: Patient tolerated treatment well General Behavior During Session: [facility] for tasks performed Cognition: [facility] for tasks performed    RUSSELL,CINDY 03/31/2012, 3:39 PM

## 2012-04-02 ENCOUNTER — Ambulatory Visit (HOSPITAL_COMMUNITY)
Admission: RE | Admit: 2012-04-02 | Discharge: 2012-04-02 | Disposition: A | Discharge status: Home / Self Care | Payer: Non-veteran care | Source: Ambulatory Visit

## 2012-04-02 NOTE — Progress Notes (Signed)
Physical Therapy Treatment Patient Details  Name: Eric [name] MRN: [id number] Date of Birth: [birth year]  Today's Date: 04/02/2012 Time: 3295-1884 PT Time Calculation (min): 47 min Treatment began by Seth Bake, PTA, [phone] Visit#: 8  of 12   Re-eval: 04/09/12 Charges:  NMR 15', gait 12', therex 15   Subjective: Symptoms/Limitations Symptoms: Pt reports HEP compliance. Pt also reports stiffness in R hand. Pain Assessment Currently in Pain?: No/denies Pain Score: 0-No pain   Exercise/Treatments Balance Exercises Standing Standing Eyes Opened: Narrow base of support (BOS);30 secs Standing Eyes Closed: Narrow base of support (BOS);1 rep;30 secs Tandem Stance: Eyes open;2 reps;30 secs Heel Raises: 15 reps;Limitations Heel Raises Limitations: no hand hold Other Standing Exercises: minisquat no hand hold x15     Physical Therapy Assessment and Plan PT Assessment and Plan Clinical Impression Statement: Pt. continues to require verbal/tactile cues for sequencing with cane when initially begins but able to maintain sequence.  Pt. unable to complete true tandem, but able to complete with narrow BOS.  No LOB with with gait activities today. Clinical Impairments Affecting Rehab Potential: Pt. will need re-eval at end of next week (2 visits).  Progress balance activities and overall stability.     Problem List Patient Active Problem List  Diagnosis  . ANEMIA  . Unspecified cerebral artery occlusion with cerebral infarction  . Lack of coordination    PT - End of Session Equipment Utilized During Treatment: Gait belt Activity Tolerance: Patient tolerated treatment well General Behavior During Session: [facility] for tasks performed Cognition: [facility] for tasks performed    Lurena Nida, PTA/CLT 04/02/2012, 3:18 PM

## 2012-04-07 ENCOUNTER — Ambulatory Visit (HOSPITAL_COMMUNITY): Payer: Non-veteran care | Admitting: *Deleted

## 2012-04-09 ENCOUNTER — Ambulatory Visit (HOSPITAL_COMMUNITY)
Admission: RE | Admit: 2012-04-09 | Discharge: 2012-04-09 | Disposition: A | Discharge status: Home / Self Care | Payer: Non-veteran care | Source: Ambulatory Visit | Attending: Internal Medicine | Admitting: Internal Medicine

## 2012-04-09 DIAGNOSIS — IMO0001 Reserved for inherently not codable concepts without codable children: Secondary | ICD-10-CM | POA: Insufficient documentation

## 2012-04-09 DIAGNOSIS — M6281 Muscle weakness (generalized): Secondary | ICD-10-CM | POA: Insufficient documentation

## 2012-04-09 DIAGNOSIS — R279 Unspecified lack of coordination: Secondary | ICD-10-CM | POA: Insufficient documentation

## 2012-04-09 DIAGNOSIS — R269 Unspecified abnormalities of gait and mobility: Secondary | ICD-10-CM | POA: Insufficient documentation

## 2012-04-09 DIAGNOSIS — M25569 Pain in unspecified knee: Secondary | ICD-10-CM | POA: Insufficient documentation

## 2012-04-09 NOTE — Progress Notes (Signed)
Physical Therapy Treatment Patient Details  Name: Eric [name] MRN: [id number] Date of Birth: May 01, 1946  Today's Date: 04/09/2012 Time: 1350-1431 PT Time Calculation (min): 41 min Visit#: 9  of 12   Re-eval: 04/16/12 Charges:  therex 15', NMR 24'  [facility]ization: [facility] 12 [facility]   Authorization Time Period: 02/25/2012-05/04/2012   Authorization Visit#: 9  of 12    Subjective: Symptoms/Limitations Symptoms: Pt. states he is feeling some stronger; states he still stumbles and looses his balance, however has not fallen. Pain Assessment Currently in Pain?: No/denies   Exercise/Treatments Balance Exercises Standing SLS: Eyes open;3 reps;Time SLS Time: L 7", R 7" Tandem Gait: 2 reps Retro Gait: 2 reps Sidestepping: 2 reps Marching: Solid surface;10 reps;Limitations Marching Limitations: 1 HHA with 5" holds Heel Raises: 20 reps;Limitations Heel Raises Limitations: no hand hold Other Standing Exercises: minisquat no hand hold x15  Seated Other Seated Exercises: R UE flex 4#, ab 4#; ER 4#, wrist flex;ext(PROM); pronate/supinate 4#, thumb ext,oppostion, finger flex/xext x 10 Other Seated Exercises: UBE x 4 @ 3.0 backward; Bike x 7(TIME)    Physical Therapy Assessment and Plan PT Assessment and Plan Clinical Impression Statement: Pt. without LOB with balance activities today.  Difficulty with finger opposition, only able to actively complete with index finger.  Added weight with other UE exercises.   PT Plan: Re-evaluate X 2 more visits; continue to work on strengthening UE and improving balance.     Problem List Patient Active Problem List  Diagnosis  . ANEMIA  . Unspecified cerebral artery occlusion with cerebral infarction  . Lack of coordination    PT - End of Session Activity Tolerance: Patient tolerated treatment well General Behavior During Session: [facility] for tasks performed Cognition: [facility] for tasks performed   Lurena Nida, PTA/CLT 04/09/2012, 2:34 PM

## 2012-04-14 ENCOUNTER — Ambulatory Visit (HOSPITAL_COMMUNITY)
Admission: RE | Admit: 2012-04-14 | Discharge: 2012-04-14 | Disposition: A | Discharge status: Home / Self Care | Payer: Non-veteran care | Source: Ambulatory Visit | Attending: Internal Medicine | Admitting: Internal Medicine

## 2012-04-14 NOTE — Progress Notes (Signed)
Physical Therapy Treatment Patient Details  Name: Eric [name] MRN: [id number] Date of Birth: June 11, 1946  Today's Date: 04/14/2012 Time: 1520-1559 PT Time Calculation (min): 39 min Visit#: 10  of 12   Re-eval: 04/16/12 Authorization Visit#: 10  of 12   Charges:  therex 38'  Subjective: Symptoms/Limitations Symptoms: Pt. states he did not stumble or loose his balance over the weekend.   Feeling somewhat stronger, no pain. Pain Assessment Currently in Pain?: No/denies   Exercise/Treatments Balance Exercises Standing Tandem Gait: 2 reps Retro Gait: 2 reps Sidestepping: 2 reps Other Standing Exercises: minisquat no hand hold x15  Seated Other Seated Exercises: R UE flex 4#, ab 4#; ER 4#, wrist flex;ext(PROM); pronate/supinate 4#, thumb ext,oppostion, finger flex/xext x 10 Other Seated Exercises: UBE x 4 @ 3.0 backward; Bike x 8 @ 4.0    Physical Therapy Assessment and Plan PT Assessment and Plan Clinical Impression Statement: Pt. with improved stability with balance activities.  Continues to work on R UE ROM and strength.  Pt. with reported difficulty negotiating stairs and discussed we would work on that next visit. Clinical Impairments Affecting Rehab Potential: Re-evaluate next visit; assess stair ability and safety.     Problem List Patient Active Problem List  Diagnosis  . ANEMIA  . Unspecified cerebral artery occlusion with cerebral infarction  . Lack of coordination    PT - End of Session Equipment Utilized During Treatment: Gait belt Activity Tolerance: Patient tolerated treatment well General Behavior During Session: [facility] for tasks performed Cognition: [facility] for tasks performed   Lurena Nida, PTA/CLT 04/14/2012, 3:59 PM

## 2012-04-16 ENCOUNTER — Ambulatory Visit (HOSPITAL_COMMUNITY)
Admission: RE | Admit: 2012-04-16 | Discharge: 2012-04-16 | Disposition: A | Discharge status: Home / Self Care | Payer: Non-veteran care | Source: Ambulatory Visit | Attending: Internal Medicine | Admitting: Internal Medicine

## 2012-04-21 ENCOUNTER — Ambulatory Visit (HOSPITAL_COMMUNITY)
Admission: RE | Admit: 2012-04-21 | Discharge: 2012-04-21 | Disposition: A | Discharge status: Home / Self Care | Payer: Non-veteran care | Source: Ambulatory Visit

## 2012-04-21 NOTE — Evaluation (Signed)
  Note deleted as a note has already been made.

## 2012-04-21 NOTE — Progress Notes (Signed)
Physical Therapy Treatment Patient Details  Name: Eric [name] MRN: [id number] Date of Birth: [birth year]  Today's Date: 04/21/2012 Time: 9147-8295 PT Time Calculation (min): 42 min Visit#: 12  of 12   Charges: Therex x 18' NMR x 22'   Subjective: Symptoms/Limitations Symptoms: Pt states that he is doing well. Pain Assessment Currently in Pain?: No/denies Pain Score: 0-No pain   Exercise/Treatments Standing Tandem Gait: 2 reps Retro Gait: 2 reps   Seated Other Seated Exercises: R UE flex 4#, ab 4# Other Seated Exercises: UBE x 4 @ 3.0 backward; Bike x 8 @ 4.0 For strengthening and activity tolerance   Physical Therapy Assessment and Plan PT Assessment and Plan Clinical Impression Statement: Pt has progressed well with balance and strength. Pt continues to have difficulty with narrowing his BOS. Majority of tx spend on tandem gait and narrowing BOS. Pt's activity tolerance has also improved. PT Plan: D/C to HEP per PT.     Problem List Patient Active Problem List  Diagnosis  . ANEMIA  . Unspecified cerebral artery occlusion with cerebral infarction  . Lack of coordination    PT - End of Session Activity Tolerance: Patient tolerated treatment well General Behavior During Session: [facility] for tasks performed Cognition: [facility] for tasks performed   Seth Bake, PTA 04/21/2012, 3:55 PM

## 2012-04-21 NOTE — Evaluation (Signed)
Physical Therapy Evaluation  Patient Details  Name: Eric [name] MRN: [id number] Date of Birth: 09-22-1945  Today's Date: 04/21/2012 Time: 4098-1191 PT Time Calculation (min): 43 min  Visit#: 11  of 12   Re-eval:  none needed Assessment Diagnosis: CVA  Authorization:   [facility] Current note was completed on Doc flowsheet on 04/16/12 but was not pulled over to a note until 04/21/12.  Pt has had 11/12 [facility].  Authorized for 12.  Pt seen earlier today which will be his last visit.l Past Medical History:  Past Medical History  Diagnosis Date  . Stroke   . Arthritis   . HTN (hypertension)    Past Surgical History: No past surgical history on file.  Subjective Symptoms/Limitations Symptoms: Pt states he is doing his exercises at home.   How long can you stand comfortably?: The patient states that he is able to stand good now.  Was only able to stand for 5-10 minutes. How long can you walk comfortably?: Pt is able to walk for 20 minutes now was 5-10 minutes. Pain Assessment Currently in Pain?: Yes    Prior Functioning  Home Living Lives With: Spouse  Cognition/Observation Cognition Overall Cognitive Status: Appears within functional limits for tasks assessed    Assessment RUE Strength Right Shoulder Flexion: 4/5 Right Shoulder ABduction: 4/5 Right Shoulder Internal Rotation: 4/5 Right Shoulder External Rotation: 3-/5 Right Elbow Flexion: 5/5 Right Elbow Extension: 5/5 Right Forearm Pronation: 2-/5 Right Forearm Supination: 2-/5 Right Wrist Flexion:  (1+/5) Right Wrist Extension:  (1+/5) Grip (lbs): 45 RLE Strength Right Hip Flexion: 5/5 Right Hip Extension: 4/5 (was 3+/5) Right Hip ABduction: 5/5 Right Hip ADduction: 5/5 Right Knee Flexion: 5/5 Right Knee Extension: 5/5 Right Ankle Dorsiflexion: 5/5 Right Ankle Plantar Flexion: 3/5 (3+5/ was 3/5) LLE Strength Left Hip Flexion: 4/5 (was 3+/5) Left Hip Extension: 4/5 (was 4/5) Left Hip ABduction: 5/5 (was  4/5) Left Hip ADduction: 2/5 (was 2-) Left Knee Flexion: 4/5 Left Knee Extension: 5/5 (was 4/5) Left Ankle Dorsiflexion: 5/5 Left Ankle Plantar Flexion: 3+/5 (was 2/5)  Exercise/Treatments Mobility/Balance  Berg Balance Test Sit to Stand: Able to stand without using hands and stabilize independently Standing Unsupported: Able to stand safely 2 minutes Sitting with Back Unsupported but Feet Supported on Floor or Stool: Able to sit safely and securely 2 minutes Stand to Sit: Sits safely with minimal use of hands Transfers: Able to transfer safely, definite need of hands Standing Unsupported with Eyes Closed: Able to stand 10 seconds safely Standing Ubsupported with Feet Together: Able to place feet together independently and stand 1 minute safely From Standing, Reach Forward with Outstretched Arm: Can reach forward >12 cm safely (5") From Standing Position, Pick up Object from Floor: Able to pick up shoe, needs supervision From Standing Position, Turn to Look Behind Over each Shoulder: Looks behind one side only/other side shows less weight shift Turn 360 Degrees: Able to turn 360 degrees safely but slowly Standing Unsupported, Alternately Place Feet on Step/Stool: Able to stand independently and complete 8 steps >20 seconds Standing Unsupported, One Foot in Front: Able to plae foot ahead of the other independently and hold 30 seconds Standing on One Leg: Tries to lift leg/unable to hold 3 seconds but remains standing independently Total Score: 45    Completed steps reciprocally;      Physical Therapy Assessment and Plan    Pt has shown some gains in his balance and LE strength decreasing his risk for falling.  UE has shown  minimal gains.  Spoke to pt that gains in his UE will probably not occur.  Goals Home Exercise Program PT Goal: Perform Home Exercise Program - Progress: Met PT Short Term Goals PT Short Term Goal 1 - Progress: Met PT Short Term Goal 2: Pt was to use a cane but  pt does not want to 04/16/12 1423 PT Long Term Goals PT Long Term Goal 1: 50% of mm have increased 1/2 grade 04/16/12 1423 PT Long Term Goal 2: Sharlene Motts has improved 5 levels goal was 04/16/12 1423  Problem List Patient Active Problem List  Diagnosis  . ANEMIA  . Unspecified cerebral artery occlusion with cerebral infarction  . Lack of coordination    PT - End of Session Equipment Utilized During Treatment: Gait belt  GP    RUSSELL,CINDY 04/21/2012, 4:02 PM  Physician Documentation Your signature is required to indicate approval of the treatment plan as stated above.  Please sign and either send electronically or make a copy of this report for your files and return this physician signed original.   Please mark one 1.__approve of plan  2. ___approve of plan with the following conditions.   ______________________________                                                          _____________________ Physician Signature                                                                                                             Date

## 2012-04-23 ENCOUNTER — Ambulatory Visit (HOSPITAL_COMMUNITY): Payer: Non-veteran care | Admitting: Physical Therapy

## 2012-05-28 ENCOUNTER — Encounter (HOSPITAL_COMMUNITY): Payer: Self-pay | Admitting: *Deleted

## 2012-05-28 ENCOUNTER — Emergency Department (HOSPITAL_COMMUNITY)
Admission: EM | Admit: 2012-05-28 | Discharge: 2012-05-28 | Disposition: A | Discharge status: Home / Self Care | Payer: Medicare Other | Attending: Emergency Medicine | Admitting: Emergency Medicine

## 2012-05-28 DIAGNOSIS — Z7982 Long term (current) use of aspirin: Secondary | ICD-10-CM | POA: Insufficient documentation

## 2012-05-28 DIAGNOSIS — M129 Arthropathy, unspecified: Secondary | ICD-10-CM | POA: Insufficient documentation

## 2012-05-28 DIAGNOSIS — Y929 Unspecified place or not applicable: Secondary | ICD-10-CM | POA: Insufficient documentation

## 2012-05-28 DIAGNOSIS — Z23 Encounter for immunization: Secondary | ICD-10-CM | POA: Insufficient documentation

## 2012-05-28 DIAGNOSIS — W57XXXA Bitten or stung by nonvenomous insect and other nonvenomous arthropods, initial encounter: Secondary | ICD-10-CM | POA: Insufficient documentation

## 2012-05-28 DIAGNOSIS — Z79899 Other long term (current) drug therapy: Secondary | ICD-10-CM | POA: Insufficient documentation

## 2012-05-28 DIAGNOSIS — F17200 Nicotine dependence, unspecified, uncomplicated: Secondary | ICD-10-CM | POA: Insufficient documentation

## 2012-05-28 DIAGNOSIS — Y939 Activity, unspecified: Secondary | ICD-10-CM | POA: Insufficient documentation

## 2012-05-28 DIAGNOSIS — S30860A Insect bite (nonvenomous) of lower back and pelvis, initial encounter: Secondary | ICD-10-CM | POA: Insufficient documentation

## 2012-05-28 DIAGNOSIS — I10 Essential (primary) hypertension: Secondary | ICD-10-CM | POA: Insufficient documentation

## 2012-05-28 DIAGNOSIS — Z8673 Personal history of transient ischemic attack (TIA), and cerebral infarction without residual deficits: Secondary | ICD-10-CM | POA: Insufficient documentation

## 2012-05-28 MED ORDER — TETANUS-DIPHTH-ACELL PERTUSSIS 5-2.5-18.5 LF-MCG/0.5 IM SUSP
0.5000 mL | Freq: Once | INTRAMUSCULAR | Status: AC
  Administered 2012-05-28: 0.5 mL via INTRAMUSCULAR
  Filled 2012-05-28: qty 0.5

## 2012-05-28 NOTE — ED Notes (Signed)
?   Tick bite to left shoulder, pt states that he noticed the spot this am.

## 2012-05-28 NOTE — ED Provider Notes (Signed)
History     CSN: [id number]  Arrival date & time 05/28/12  1246   First MD Initiated Contact with Patient 05/28/12 1320      Chief Complaint  Patient presents with  . Insect Bite    (Consider location/radiation/quality/duration/timing/severity/associated sxs/prior treatment) HPI Comments: Contrell E Penner presents with an embedded tick on his left upper back which was first noticed this morning.  He has no other complaint including pain,  Fevers, chills or headache.  He is unsure of how long the tick has been present, he first noticed it this morning.    The history is provided by the patient.    Past Medical History  Diagnosis Date  . Stroke   . Arthritis   . HTN (hypertension)     History reviewed. No pertinent past surgical history.  Family History  Problem Relation Age of Onset  . Colon polyps Neg Hx     History  Substance Use Topics  . Smoking status: Current Some Day Smoker  . Smokeless tobacco: Not on file  . Alcohol Use: No      Review of Systems  Constitutional: Negative for fever and chills.  HENT: Negative for facial swelling.   Respiratory: Negative for shortness of breath and wheezing.   Skin: Negative for color change, rash and wound.  Neurological: Negative for numbness and headaches.    Allergies  Review of patient's allergies indicates no known allergies.  Home Medications   Current Outpatient Rx  Name Route Sig Dispense Refill  . AMLODIPINE BESYLATE 5 MG PO TABS Oral Take 5 mg by mouth daily.      . ASPIRIN 81 MG PO TABS Oral Take 81 mg by mouth daily.      . QUINAPRIL-HYDROCHLOROTHIAZIDE 20-12.5 MG PO TABS Oral Take 1 tablet by mouth daily.        BP 147/64  Pulse 90  Temp 97.4 F (36.3 C)  Resp 18  SpO2 95%  Physical Exam  Constitutional: He appears well-developed and well-nourished. No distress.  HENT:  Head: Normocephalic.  Neck: Neck supple.  Cardiovascular: Normal rate.   Pulmonary/Chest: Effort normal. He has no  wheezes.  Musculoskeletal: Normal range of motion. He exhibits no edema.  Skin: No rash noted. No erythema.       Small engorged tick left upper back with no surrounding erythema or edema.    ED Course  Procedures (including critical care time)  Labs Reviewed - No data to display No results found.   1. Tick bite of back     Tetanus updated.  Tick easily removed with forceps.  Pt tolerated well.  MDM  Advised pt to watch for any signs of tick infection, including rash, fevers, chills, headache - return for a recheck or see his MD if he develops sx.        Burgess Amor, [place] 05/28/12 1342

## 2012-05-29 NOTE — ED Provider Notes (Signed)
Medical screening examination/treatment/procedure(s) were performed by non-physician practitioner and as supervising physician I was immediately available for consultation/collaboration.   Farha Dano III, MD 05/29/12 1653 

## 2012-10-13 ENCOUNTER — Encounter (HOSPITAL_COMMUNITY): Payer: Self-pay | Admitting: Emergency Medicine

## 2012-10-13 ENCOUNTER — Emergency Department (HOSPITAL_COMMUNITY)
Admission: EM | Admit: 2012-10-13 | Discharge: 2012-10-13 | Disposition: A | Discharge status: Home / Self Care | Payer: Medicare Other | Attending: Emergency Medicine | Admitting: Emergency Medicine

## 2012-10-13 DIAGNOSIS — Z8739 Personal history of other diseases of the musculoskeletal system and connective tissue: Secondary | ICD-10-CM | POA: Insufficient documentation

## 2012-10-13 DIAGNOSIS — Z8673 Personal history of transient ischemic attack (TIA), and cerebral infarction without residual deficits: Secondary | ICD-10-CM | POA: Insufficient documentation

## 2012-10-13 DIAGNOSIS — K5900 Constipation, unspecified: Secondary | ICD-10-CM | POA: Insufficient documentation

## 2012-10-13 DIAGNOSIS — F17200 Nicotine dependence, unspecified, uncomplicated: Secondary | ICD-10-CM | POA: Insufficient documentation

## 2012-10-13 DIAGNOSIS — Z79899 Other long term (current) drug therapy: Secondary | ICD-10-CM | POA: Insufficient documentation

## 2012-10-13 DIAGNOSIS — I10 Essential (primary) hypertension: Secondary | ICD-10-CM | POA: Insufficient documentation

## 2012-10-13 DIAGNOSIS — Z7982 Long term (current) use of aspirin: Secondary | ICD-10-CM | POA: Insufficient documentation

## 2012-10-13 HISTORY — DX: Constipation, unspecified: K59.00

## 2012-10-13 MED ORDER — POLYETHYLENE GLYCOL 3350 17 G PO PACK: 17.0000 g | PACK | Freq: Every day | ORAL | Status: DC

## 2012-10-13 NOTE — ED Notes (Signed)
Patient with no complaints at this time. Respirations even and unlabored. Skin warm/dry. Discharge instructions reviewed with patient at this time. Patient given opportunity to voice concerns/ask questions. Patient discharged at this time and left Emergency Department with steady gait.   

## 2012-10-13 NOTE — ED Notes (Signed)
Pt has been treated at [facility] for contipation and stomach issues. Pt here today because he has not had a BM in three days .has not tried over the counter medication for relief.

## 2012-10-13 NOTE — ED Provider Notes (Signed)
History  This chart was scribed for Joya Gaskins, MD by Bennett Scrape, ED Scribe. This patient was seen in room APA03/APA03 and the patient's care was started at 7:48 AM.  CSN: [id number]  Arrival date & time 10/13/12  1027   First MD Initiated Contact with Patient 10/13/12 [phone]      Chief Complaint  Patient presents with  . Constipation     Patient is a 67 y.o. male presenting with constipation. The history is provided by the patient. No language interpreter was used.  Constipation  The current episode started 3 to 5 days ago. The onset was gradual. The problem occurs continuously. The problem has been gradually worsening. The patient is experiencing no pain. Pertinent negatives include no fever, no abdominal pain, no diarrhea, no nausea and no vomiting.    Eric [name] is a 67 y.o. male who presents to the Emergency Department complaining of 3 days of gradual onset, gradually worsening, constant constipation described as having no BMs. He denies taking OTC medications at home to improve symptoms. He denies being on any medication that could cause constipation currently. He denies abdominal pain, back pain, fevers, and extremity numbness or weakness as associated symptoms. He has a h/o HTN and CVA. He is a current everyday smoker but denies alcohol use.  PCP is Dr. Romeo Apple with the [facility].  Past Medical History  Diagnosis Date  . Stroke   . Arthritis   . HTN (hypertension)     History reviewed. No pertinent past surgical history.  Family History  Problem Relation Age of Onset  . Colon polyps Neg Hx     History  Substance Use Topics  . Smoking status: Current Some Day Smoker  . Smokeless tobacco: Not on file  . Alcohol Use: No      Review of Systems  Constitutional: Negative for fever.  Gastrointestinal: Positive for constipation. Negative for nausea, vomiting, abdominal pain and diarrhea.  Genitourinary: Negative for dysuria and frequency.  Musculoskeletal:  Negative for back pain.  Neurological: Negative for weakness and numbness.    Allergies  Review of patient's allergies indicates no known allergies.  Home Medications   Current Outpatient Rx  Name  Route  Sig  Dispense  Refill  . amLODipine (NORVASC) 5 MG tablet   Oral   Take 5 mg by mouth daily.           Marland Kitchen aspirin 81 MG tablet   Oral   Take 81 mg by mouth daily.           . quinapril-hydrochlorothiazide (ACCURETIC) 20-12.5 MG per tablet   Oral   Take 1 tablet by mouth daily.             Triage Vitals: BP 135/89  Pulse 84  Temp(Src) 98 F (36.7 C) (Oral)  Resp 20  Ht 5\' 6"  (1.676 m)  Wt 207 lb (93.895 kg)  BMI 33.43 kg/m2  SpO2 100%  Physical Exam  Nursing note and vitals reviewed.  CONSTITUTIONAL: Well developed/well nourished HEAD: Normocephalic/atraumatic EYES: EOMI ENMT: Mucous membranes moist NECK: supple no meningeal signs CV: S1/S2 noted, no murmurs/rubs/gallops noted LUNGS: Lungs are clear to auscultation bilaterally, no apparent distress ABDOMEN: soft, nontender, no rebound or guarding, +BS noted in all quadrants GU:no cva tenderness RECTAL: Stool color normal, no impaction, no hemorrhoid or abscess, chaperone present NEURO: Pt is awake/alert, moves all extremitiesx4 EXTREMITIES: pulses normal, full ROM SKIN: warm, color normal PSYCH: no abnormalities of mood noted  ED Course  Procedures   DIAGNOSTIC STUDIES: Oxygen Saturation is 100% on room air, normal by my interpretation.    COORDINATION OF CARE: 7:52 AM-Discussed treatment plan which includes rectal exam with pt at bedside and pt agreed to plan.   7:55 AM- Discussed discharge plan which includes Miralax with pt and pt agreed to plan. Also advised [facility] and to return for onset of emesis, fevers, etc and pt agreed.  Low suspicion for acute abdominal process or bowel obstruction Stable for d/c   MDM  Nursing notes including past medical history and social  history reviewed and considered in documentation     I personally performed the services described in this documentation, which was scribed in my presence. The recorded information has been reviewed and is accurate.        Joya Gaskins, MD 10/13/12 [phone]

## 2013-06-12 ENCOUNTER — Emergency Department (HOSPITAL_COMMUNITY): Payer: Medicare Other

## 2013-06-12 ENCOUNTER — Emergency Department (HOSPITAL_COMMUNITY)
Admission: EM | Admit: 2013-06-12 | Discharge: 2013-06-12 | Disposition: A | Discharge status: Home / Self Care | Payer: Medicare Other | Attending: Emergency Medicine | Admitting: Emergency Medicine

## 2013-06-12 ENCOUNTER — Encounter (HOSPITAL_COMMUNITY): Payer: Self-pay | Admitting: Emergency Medicine

## 2013-06-12 DIAGNOSIS — Y939 Activity, unspecified: Secondary | ICD-10-CM | POA: Insufficient documentation

## 2013-06-12 DIAGNOSIS — Y929 Unspecified place or not applicable: Secondary | ICD-10-CM | POA: Insufficient documentation

## 2013-06-12 DIAGNOSIS — I10 Essential (primary) hypertension: Secondary | ICD-10-CM | POA: Insufficient documentation

## 2013-06-12 DIAGNOSIS — M25562 Pain in left knee: Secondary | ICD-10-CM

## 2013-06-12 DIAGNOSIS — Z791 Long term (current) use of non-steroidal anti-inflammatories (NSAID): Secondary | ICD-10-CM | POA: Insufficient documentation

## 2013-06-12 DIAGNOSIS — Z79899 Other long term (current) drug therapy: Secondary | ICD-10-CM | POA: Insufficient documentation

## 2013-06-12 DIAGNOSIS — Z7982 Long term (current) use of aspirin: Secondary | ICD-10-CM | POA: Insufficient documentation

## 2013-06-12 DIAGNOSIS — S8990XA Unspecified injury of unspecified lower leg, initial encounter: Secondary | ICD-10-CM | POA: Insufficient documentation

## 2013-06-12 DIAGNOSIS — W1809XA Striking against other object with subsequent fall, initial encounter: Secondary | ICD-10-CM | POA: Insufficient documentation

## 2013-06-12 DIAGNOSIS — K5900 Constipation, unspecified: Secondary | ICD-10-CM | POA: Insufficient documentation

## 2013-06-12 DIAGNOSIS — M1990 Unspecified osteoarthritis, unspecified site: Secondary | ICD-10-CM

## 2013-06-12 DIAGNOSIS — F17200 Nicotine dependence, unspecified, uncomplicated: Secondary | ICD-10-CM | POA: Insufficient documentation

## 2013-06-12 DIAGNOSIS — Z8673 Personal history of transient ischemic attack (TIA), and cerebral infarction without residual deficits: Secondary | ICD-10-CM | POA: Insufficient documentation

## 2013-06-12 DIAGNOSIS — X500XXA Overexertion from strenuous movement or load, initial encounter: Secondary | ICD-10-CM | POA: Insufficient documentation

## 2013-06-12 MED ORDER — HYDROCODONE-ACETAMINOPHEN 5-325 MG PO TABS
1.0000 | ORAL_TABLET | Freq: Once | ORAL | Status: AC
  Administered 2013-06-12: 1 via ORAL
  Filled 2013-06-12: qty 1

## 2013-06-12 MED ORDER — HYDROCODONE-ACETAMINOPHEN 5-325 MG PO TABS: 1.0000 | ORAL_TABLET | ORAL | Status: DC | PRN

## 2013-06-12 NOTE — ED Notes (Signed)
Patient with no complaints at this time. Respirations even and unlabored. Skin warm/dry. Discharge instructions reviewed with patient at this time. Patient given opportunity to voice concerns/ask questions. Patient discharged at this time and left Emergency Department with steady gait.   
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
## 2013-06-12 NOTE — ED Notes (Signed)
Pt c/o chronic knee problems with increased pain and decreased mobility starting yesterday. Pt reports swelling to left knee and difficulty supporting weight.

## 2013-08-10 ENCOUNTER — Emergency Department (HOSPITAL_COMMUNITY): Payer: Medicare Other

## 2013-08-10 ENCOUNTER — Emergency Department (HOSPITAL_COMMUNITY)
Admission: EM | Admit: 2013-08-10 | Discharge: 2013-08-10 | Disposition: A | Discharge status: Home / Self Care | Payer: Medicare Other | Attending: Emergency Medicine | Admitting: Emergency Medicine

## 2013-08-10 ENCOUNTER — Encounter (HOSPITAL_COMMUNITY): Payer: Self-pay | Admitting: Emergency Medicine

## 2013-08-10 DIAGNOSIS — S161XXA Strain of muscle, fascia and tendon at neck level, initial encounter: Secondary | ICD-10-CM

## 2013-08-10 DIAGNOSIS — Y9389 Activity, other specified: Secondary | ICD-10-CM | POA: Insufficient documentation

## 2013-08-10 DIAGNOSIS — F17200 Nicotine dependence, unspecified, uncomplicated: Secondary | ICD-10-CM | POA: Insufficient documentation

## 2013-08-10 DIAGNOSIS — S139XXA Sprain of joints and ligaments of unspecified parts of neck, initial encounter: Secondary | ICD-10-CM | POA: Insufficient documentation

## 2013-08-10 DIAGNOSIS — Z8739 Personal history of other diseases of the musculoskeletal system and connective tissue: Secondary | ICD-10-CM | POA: Insufficient documentation

## 2013-08-10 DIAGNOSIS — Z79899 Other long term (current) drug therapy: Secondary | ICD-10-CM | POA: Insufficient documentation

## 2013-08-10 DIAGNOSIS — Z8719 Personal history of other diseases of the digestive system: Secondary | ICD-10-CM | POA: Insufficient documentation

## 2013-08-10 DIAGNOSIS — Z8673 Personal history of transient ischemic attack (TIA), and cerebral infarction without residual deficits: Secondary | ICD-10-CM | POA: Insufficient documentation

## 2013-08-10 DIAGNOSIS — I10 Essential (primary) hypertension: Secondary | ICD-10-CM | POA: Insufficient documentation

## 2013-08-10 DIAGNOSIS — Y92410 Unspecified street and highway as the place of occurrence of the external cause: Secondary | ICD-10-CM | POA: Insufficient documentation

## 2013-08-10 MED ORDER — MELOXICAM 7.5 MG PO TABS: 7.5000 mg | ORAL_TABLET | Freq: Every day | ORAL | Status: DC

## 2013-08-10 MED ORDER — HYDROCODONE-ACETAMINOPHEN 5-325 MG PO TABS: 1.0000 | ORAL_TABLET | Freq: Four times a day (QID) | ORAL | Status: DC | PRN
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
## 2013-08-10 NOTE — Discharge Instructions (Signed)

## 2013-08-13 NOTE — ED Provider Notes (Signed)
Medical screening examination/treatment/procedure(s) were performed by non-physician practitioner and as supervising physician I was immediately available for consultation/collaboration.  EKG Interpretation   None         Boston Cookson L Azaliyah Kennard, MD 08/13/13 0833 

## 2013-11-04 ENCOUNTER — Ambulatory Visit (HOSPITAL_COMMUNITY)
Admission: RE | Admit: 2013-11-04 | Discharge: 2013-11-04 | Disposition: A | Discharge status: Home / Self Care | Payer: Non-veteran care | Source: Ambulatory Visit | Attending: Internal Medicine | Admitting: Internal Medicine

## 2013-11-04 DIAGNOSIS — M545 Low back pain, unspecified: Secondary | ICD-10-CM | POA: Insufficient documentation

## 2013-11-04 DIAGNOSIS — Z8673 Personal history of transient ischemic attack (TIA), and cerebral infarction without residual deficits: Secondary | ICD-10-CM | POA: Insufficient documentation

## 2013-11-04 DIAGNOSIS — M25669 Stiffness of unspecified knee, not elsewhere classified: Secondary | ICD-10-CM | POA: Insufficient documentation

## 2013-11-04 DIAGNOSIS — R269 Unspecified abnormalities of gait and mobility: Secondary | ICD-10-CM | POA: Insufficient documentation

## 2013-11-04 DIAGNOSIS — IMO0001 Reserved for inherently not codable concepts without codable children: Secondary | ICD-10-CM | POA: Insufficient documentation

## 2013-11-04 DIAGNOSIS — M79609 Pain in unspecified limb: Secondary | ICD-10-CM | POA: Insufficient documentation

## 2013-11-04 DIAGNOSIS — R279 Unspecified lack of coordination: Secondary | ICD-10-CM | POA: Insufficient documentation

## 2013-11-04 DIAGNOSIS — M2560 Stiffness of unspecified joint, not elsewhere classified: Secondary | ICD-10-CM | POA: Insufficient documentation

## 2013-11-04 DIAGNOSIS — R262 Difficulty in walking, not elsewhere classified: Secondary | ICD-10-CM | POA: Insufficient documentation

## 2013-11-04 HISTORY — DX: Stiffness of unspecified joint, not elsewhere classified: M25.60

## 2013-11-04 NOTE — Evaluation (Signed)
Physical Therapy Evaluation  Patient Details  Name: Eric [name] MRN: [id number] Date of Birth: 1946/03/21  Today's Date: 11/04/2013 Time: 1305-1350 PT Time Calculation (min): 45 min Charge:  evaluation             Visit#: 1 of 12  Re-eval: 12/04/13 Assessment Diagnosis: difficulty walking Prior Therapy: 2013  [facility]    Authorization Time Period: 08/19/2013 - 03/04/2014  Authorization Visit#: 1 of 12   Past Medical History:  Past Medical History  Diagnosis Date  . Stroke   . Arthritis   . HTN (hypertension)   . Constipation    Past Surgical History: No past surgical history on file.  Subjective Symptoms/Limitations Symptoms: Eric [name] has been seen at this faciility folllowing a CVA from 03/10/2012.  He states that he was doing well but in the past six months he has noticed that he is walking more bent over and is shuffling his feet more.  He has been referred to therapy to improve his gait.  He currently states that he is walking to the mailbox  and back for exercies.   Pertinent History: CVA  2013  How long can you sit comfortably?: no problem. able to sit for  How long can you stand comfortably?: able to stand for five minutes  How long can you walk comfortably?: Able to walk about five minutes. Pain Assessment Currently in Pain?: Yes Pain Score: 5  Pain Location: Back Pain Orientation: Left Pain Radiating Towards: to knee     Balance Screening  no falls     Sensation/Coordination/Flexibility/Functional Tests Flexibility Thomas: Positive Obers: Positive 90/90: Positive Functional Tests Functional Tests: foto 34  Assessment RLE Strength Right Hip Flexion: 5/5 Right Hip Extension: 3/5 Right Knee Flexion: 5/5 Right Knee Extension: 4/5 Right Ankle Dorsiflexion:  (5-/5) LLE Strength Left Hip Flexion: 5/5 Left Hip Extension: 3-/5 Left Hip ABduction: 5/5 Left Knee Flexion: 4/5 Left Knee Extension: 4/5 Left Ankle Dorsiflexion:  5/5  Exercise/Treatments Mobility/Balance  Berg Balance Test Sit to Stand: Able to stand without using hands and stabilize independently Standing Unsupported: Able to stand safely 2 minutes Sitting with Back Unsupported but Feet Supported on Floor or Stool: Able to sit safely and securely 2 minutes Stand to Sit: Controls descent by using hands Transfers: Able to transfer safely, definite need of hands Standing Unsupported with Eyes Closed: Able to stand 10 seconds safely Standing Ubsupported with Feet Together: Able to place feet together independently and stand for 1 minute with supervision From Standing, Reach Forward with Outstretched Arm: Can reach forward >12 cm safely (5") From Standing Position, Pick up Object from Floor: Able to pick up shoe safely and easily From Standing Position, Turn to Look Behind Over each Shoulder: Looks behind one side only/other side shows less weight shift Turn 360 Degrees: Able to turn 360 degrees safely but slowly Standing Unsupported, Alternately Place Feet on Step/Stool: Able to complete 4 steps without aid or supervision Standing Unsupported, One Foot in Front: Able to take small step independently and hold 30 seconds Standing on One Leg: Tries to lift leg/unable to hold 3 seconds but remains standing independently Total Score: 42    Seated Other Seated Knee Exercises: scapular retraction x 10 Supine Quad Sets: 10 reps Heel Slides:  (focusing on pushing heel out as far as possible to stretch L) Bridges: 10 reps    Physical Therapy Assessment and Plan PT Assessment and Plan Clinical Impression Statement: Pt is a 68 yo male who sustained a  CVA in 2013.  The patient went through therapy at the time of the stroke but feels his walking is getting more difficult.  He is referred from the [place] for therapy to improve the ease as well as the safety of his gait.  Exam reveals that pt ambulates with a forward bent position.  He is not using an assisitve  device but when he completed the Berg test the test shows he should be using a cane.  Pt was made aware of this and does have a cane at home.  Pt exam also demonstrates tight low back, hip and knee mm as well as some weakness in his Lt LE.  Pt will benefit from skilled therapy to address the forementioned deficits to improve the ease and safety of his gait. Pt will benefit from skilled therapeutic intervention in order to improve on the following deficits: Abnormal gait;Decreased activity tolerance;Decreased balance;Difficulty walking;Impaired flexibility;Decreased strength Rehab Potential: Good PT Frequency: Min 3X/week PT Duration: 4 weeks PT Plan: Pt to work on both stretching and balance to improve gait pattern.  Gt training with SPC.     Goals Home Exercise Program Pt/caregiver will Perform Home Exercise Program: For increased strengthening;For increased ROM PT Short Term Goals Time to Complete Short Term Goals: 2 weeks PT Short Term Goal 1: Pt to be able to stand for 15 minutes wihout having to sit down PT Short Term Goal 2: Pt berg score improved by 5 pts to allow pt to be safe ambulating inside without a cane. PT Short Term Goal 3: Pt to be able to walk x 15 minutes to have better health habits. PT Long Term Goals Time to Complete Long Term Goals: 4 weeks PT Long Term Goal 1: I in advance HEP PT Long Term Goal 2: Pt to be able to walk for 30 mintues in order to go shopping with his wife Long Term Goal 3: Berg score improved by 8 to be safe walking without an assistive device.  Problem List Patient Active Problem List   Diagnosis Date Noted  . Difficulty in walking(719.7) 11/04/2013  . Stiffness of joints, not elsewhere classified, multiple sites 11/04/2013  . Unspecified cerebral artery occlusion with cerebral infarction 07/15/2011  . Lack of coordination 07/15/2011  . ANEMIA 06/04/2010    PT Plan of Care PT Home Exercise Plan: given.  GP Functional Assessment Tool Used:  foto Functional Limitation: Mobility: Walking and moving around Mobility: Walking and Moving Around Current Status [phone]): At least 60 percent but less than 80 percent impaired, limited or restricted Mobility: Walking and Moving Around Goal Status [phone]): At least 40 percent but less than 60 percent impaired, limited or restricted  Cora Stetson,CINDY 11/04/2013, 4:47 PM  Physician Documentation Your signature is required to indicate approval of the treatment plan as stated above.  Please sign and either send electronically or make a copy of this report for your files and return this physician signed original.   Please mark one 1.__approve of plan  2. ___approve of plan with the following conditions.   ______________________________                                                          _____________________ Physician Signature  Date  

## 2013-11-08 ENCOUNTER — Ambulatory Visit (HOSPITAL_COMMUNITY)
Admission: RE | Admit: 2013-11-08 | Discharge: 2013-11-08 | Disposition: A | Discharge status: Home / Self Care | Payer: Non-veteran care | Source: Ambulatory Visit | Attending: Internal Medicine | Admitting: Internal Medicine

## 2013-11-08 NOTE — Progress Notes (Signed)
Physical Therapy Treatment Patient Details  Name: Eric [name] MRN: [id number] Date of Birth: 11-Nov-1945  Today's Date: 11/08/2013 Time: 1020-1100 PT Time Calculation (min): 40 min  Visit#: 2 of 12  Re-eval: 12/04/13 [facility]  Authorization Time Period: 08/19/2013 - 03/04/2014  Authorization Visit#: 2 of 12  Charges:  Gait 1020-1028 (8'), TE 1030-1040 (10'), NMR 1040-1100 (20')  Subjective: Symptoms/Limitations Symptoms: Pt comes today with his SPC.  STates he's been using it some.  No complaints of pain.   Exercise/Treatments Balance Exercises Standing Tandem Gait: 2 reps Retro Gait: 2 reps Sidestepping: 2 reps Other Standing Exercises: gait with SPC working on equal stride and sequencing  Seated Other Seated Exercises: nustep 10' with UE/LE hills #2 level 2   Physical Therapy Assessment and Plan PT Assessment and Plan Clinical Impression Statement: Adjusted SPC height for patient and worked on ambuation with equal step length, posture and sequencing.  Overall improvment noted.  Began dynamic balance activities.  Most difficulty with sidestepping/keeping in a straight line.  Nustep added for reciprocal movements of UE/LE as well as activity tolerance and strengthening.   PT Plan: Continue to progress dynamic balance and gait quality.  Add stretches next visit.     Problem List Patient Active Problem List   Diagnosis Date Noted  . Difficulty in walking(719.7) 11/04/2013  . Stiffness of joints, not elsewhere classified, multiple sites 11/04/2013  . Unspecified cerebral artery occlusion with cerebral infarction 07/15/2011  . Lack of coordination 07/15/2011  . ANEMIA 06/04/2010    PT Plan of Care PT Home Exercise Plan: given.  GP Functional Limitation: Mobility: Walking and moving around  Teena Irani, PTA/CLT 11/08/2013, 12:02 PM

## 2013-11-10 ENCOUNTER — Ambulatory Visit (HOSPITAL_COMMUNITY)
Admission: RE | Admit: 2013-11-10 | Discharge: 2013-11-10 | Disposition: A | Discharge status: Home / Self Care | Payer: Non-veteran care | Source: Ambulatory Visit | Attending: Internal Medicine | Admitting: Internal Medicine

## 2013-11-10 DIAGNOSIS — R262 Difficulty in walking, not elsewhere classified: Secondary | ICD-10-CM

## 2013-11-10 NOTE — Progress Notes (Signed)
Physical Therapy Treatment Patient Details  Name: Eric [name] MRN: [id number] Date of Birth: 24-May-1946  Today's Date: 11/10/2013 Time: 1300-1355 PT Time Calculation (min): 75 min Charge:  NMR 1300-1338, TE 0109-3235  Visit#: 3 of 12  Re-eval: 12/04/13 Assessment Diagnosis: difficulty walking  Authorization: [facility]  Authorization Time Period: 08/19/2013 - 03/04/2014  Authorization Visit#: 3 of 12   Subjective: Symptoms/Limitations Symptoms: Pt entered dept ambulating with no AD, stated left SPC in car.  No pain today.   Pain Assessment Currently in Pain?: No/denies  Objective:   Exercise/Treatments Balance Exercises Standing Tandem Gait: 2 reps Retro Gait: 2 reps Sidestepping: 2 reps Cone Rotation: Foam;R/L Other Standing Exercises: Lunge walking with rotation1RT Other Standing Exercises: standing hamstring stretch on 16 in box 3x 30", gastroc stretch on slant board 3x 30"  Seated Other Seated Exercises: nustep 10' with UE/LE hills #3 level 3   Physical Therapy Assessment and Plan PT Assessment and Plan Clinical Impression Statement: Added cone rotation and lunge walking with rotation to improve hip mobility and balance with gait.  Added LE stretches to improve flexibility Bil LE.  Pt required constant cueing for posture, equal stride length and sequencing with gait mechanicns, min assistance for LOB episodes with balance activities.  Ended session on Nustep for reciprocal sequencing wth UE/LE for actitvity tolerance and strengthening.   PT Plan: Continue to progress dynamic balance and gait quality. Continue with stretches and gait training activities.      Goals Home Exercise Program Pt/caregiver will Perform Home Exercise Program: For increased strengthening;For increased ROM PT Short Term Goals Time to Complete Short Term Goals: 2 weeks PT Short Term Goal 1: Pt to be able to stand for 15 minutes wihout having to sit down PT Short Term Goal 1 - Progress: Progressing  toward goal PT Short Term Goal 2: Pt berg score improved by 5 pts to allow pt to be safe ambulating inside without a cane. PT Short Term Goal 2 - Progress: Progressing toward goal PT Short Term Goal 3: Pt to be able to walk x 15 minutes to have better health habits. PT Short Term Goal 3 - Progress: Progressing toward goal PT Long Term Goals Time to Complete Long Term Goals: 4 weeks PT Long Term Goal 1: I in advance HEP PT Long Term Goal 2: Pt to be able to walk for 30 mintues in order to go shopping with his wife Long Term Goal 3: Berg score improved by 8 to be safe walking without an assistive device.  Problem List Patient Active Problem List   Diagnosis Date Noted  . Difficulty in walking(719.7) 11/04/2013  . Stiffness of joints, not elsewhere classified, multiple sites 11/04/2013  . Unspecified cerebral artery occlusion with cerebral infarction 07/15/2011  . Lack of coordination 07/15/2011  . ANEMIA 06/04/2010    PT - End of Session Equipment Utilized During Treatment: Gait belt Activity Tolerance: Patient tolerated treatment well General Behavior During Therapy: [facility] for tasks assessed/performed  GP    Aldona Lento 11/10/2013, 2:03 PM

## 2013-11-12 ENCOUNTER — Ambulatory Visit (HOSPITAL_COMMUNITY)
Admission: RE | Admit: 2013-11-12 | Discharge: 2013-11-12 | Disposition: A | Discharge status: Home / Self Care | Payer: Non-veteran care | Source: Ambulatory Visit | Attending: Internal Medicine | Admitting: Internal Medicine

## 2013-11-12 DIAGNOSIS — R262 Difficulty in walking, not elsewhere classified: Secondary | ICD-10-CM

## 2013-11-12 NOTE — Progress Notes (Signed)
Physical Therapy Treatment Patient Details  Name: Eric [name] MRN: [id number] Date of Birth: 1945-09-27  Today's Date: 11/12/2013 Time: 1310-1345 PT Time Calculation (min): 35 min  Charges: TherEx 35  Visit#: 4 of 12  Re-eval: 12/04/13 Assessment Diagnosis: difficulty walking Prior Therapy: 2013  Authorization:    Authorization Time Period:    Authorization Visit#: 4 of 12   Subjective: Symptoms/Limitations Symptoms: patient walked in bringing [place] in both hands. Patient states compliance with HEP  Pertinent History: CVA  2013  Pain Assessment Currently in Pain?: No/denies  Precautions/Restrictions     Exercise/Treatments Stretches Active Hamstring Stretch: 3 reps;30 seconds (Lt LE numbness following stretch. ) Gastroc Stretch: 30 seconds;3 reps Standing Lateral Step Up: Step Height: 4";5 reps;Both;Step Height: 6";Right;3 sets Forward Step Up: Both;2 sets;Step Height: 4";5 reps    Physical Therapy Assessment and Plan PT Assessment and Plan Clinical Impression Statement: Patint performed 4" and 6" step ups with mod to CGA for balance and cuing. Patient requires cuing to improve weight shifting, but once cued is able to perform step up with only CGA for safety. Similar to step ups patient was able to perform 6": hurdls with moderate to max assist for balance and safety, but once cued to slightly improve weight shift he was  able to perform hurdles with min assist. Recommend continued focus on weight shiftign to progress walking and dynamic balance.   Pt will benefit from skilled therapeutic intervention in order to improve on the following deficits: Abnormal gait;Decreased activity tolerance;Decreased balance;Difficulty walking;Impaired flexibility;Decreased strength Rehab Potential: Good PT Plan: Continue to progress dynamic balance and gait quality. Continue with stretches and gait training activities.      Goals PT Short Term Goals PT Short Term Goal 1 - Progress:  Progressing toward goal PT Short Term Goal 2 - Progress: Progressing toward goal PT Short Term Goal 3 - Progress: Progressing toward goal PT Long Term Goals PT Long Term Goal 1 - Progress: Progressing toward goal PT Long Term Goal 2 - Progress: Progressing toward goal Long Term Goal 3 Progress: Progressing toward goal  Problem List Patient Active Problem List   Diagnosis Date Noted  . Difficulty in walking(719.7) 11/04/2013  . Stiffness of joints, not elsewhere classified, multiple sites 11/04/2013  . Unspecified cerebral artery occlusion with cerebral infarction 07/15/2011  . Lack of coordination 07/15/2011  . ANEMIA 06/04/2010    PT Plan of Care PT Home Exercise Plan: given.  GP    Suzette Battiest Shep Porter PT DPT 11/12/2013, 2:17 PM

## 2013-11-15 ENCOUNTER — Ambulatory Visit (HOSPITAL_COMMUNITY)
Admission: RE | Admit: 2013-11-15 | Discharge: 2013-11-15 | Disposition: A | Discharge status: Home / Self Care | Payer: Non-veteran care | Source: Ambulatory Visit | Attending: Internal Medicine | Admitting: Internal Medicine

## 2013-11-15 NOTE — Progress Notes (Addendum)
Physical Therapy Treatment Patient Details  Name: Eric [name] MRN: [id number] Date of Birth: [birth year]  Today's Date: 11/15/2013 Time: 2542-7062 PT Time Calculation (min): 41 min Charges: Therex x 24' [phone]) NMR x 15' [phone])  Visit#: 5 of 12  Re-eval: 12/04/13  Authorization: [facility]  Authorization Time Period: 08/19/2013 - 03/04/2014  Authorization Visit#: 5 of 12   Subjective: Symptoms/Limitations Symptoms: Pt walked from waiting room to gym with [facility] but cane not in contact with ground. Pt states that he is going to ask his [place] physician about therapy for his hands on Wednesday 11/17/13 Pain Assessment Currently in Pain?: No/denies   Exercise/Treatments Stretches Active Hamstring Stretch: 3 reps;30 seconds;Limitations Active Hamstring Stretch Limitations: on mat table Gastroc Stretch: 3 reps;30 seconds;Limitations Gastroc Stretch Limitations: Slant board Standing Lateral Step Up: Step Height: 4";1 set;Both;10 reps Forward Step Up: Both;Step Height: 6";10 reps;1 Nature conservation officer Board: 2 minutes;Limitations Rocker Board Limitations: R/L   Balance Exercises Standing Step Over Hurdles / Cones: Leading with RLE and LLE 2 RT with 6" Heel Raises: Both;10 reps Toe Raise: Both;10 reps Sit to Stand: Standard surface;Limitations Sit to Stand Limitations: 5 reps   Physical Therapy Assessment and Plan PT Assessment and Plan Clinical Impression Statement: Patient requires multilmodal cueing to improve technique and form for exercises and stretches. Pt required mod assist with 6" hurdles for balance and multimodal cueing for weightshifting and off loading correct LE. This entire session was guided, instructed, and directly supervised by Rachelle Hora, PTA. PT Plan: Continue to progress dynamic balance and gait quality. Continue with stretches and gait training activities. Focus on rotation exercises to improve gait mechanics next session.     Problem List Patient Active  Problem List   Diagnosis Date Noted  . Difficulty in walking(719.7) 11/04/2013  . Stiffness of joints, not elsewhere classified, multiple sites 11/04/2013  . Unspecified cerebral artery occlusion with cerebral infarction 07/15/2011  . Lack of coordination 07/15/2011  . ANEMIA 06/04/2010    PT - End of Session Equipment Utilized During Treatment: Gait belt Activity Tolerance: Patient tolerated treatment well General Behavior During Therapy: [facility] for tasks assessed/performed  Ahmed Prima, [place], PTA  11/15/2013, 2:07 PM

## 2013-11-17 ENCOUNTER — Ambulatory Visit (HOSPITAL_COMMUNITY): Payer: Non-veteran care

## 2013-11-19 ENCOUNTER — Ambulatory Visit (HOSPITAL_COMMUNITY)
Admission: RE | Admit: 2013-11-19 | Discharge: 2013-11-19 | Disposition: A | Discharge status: Home / Self Care | Payer: Non-veteran care | Source: Ambulatory Visit | Attending: Internal Medicine | Admitting: Internal Medicine

## 2013-11-19 DIAGNOSIS — R262 Difficulty in walking, not elsewhere classified: Secondary | ICD-10-CM

## 2013-11-19 NOTE — Progress Notes (Addendum)
Physical Therapy Treatment Patient Details  Name: Eric [name] MRN: [id number] Date of Birth: 21-Mar-1946  Today's Date: 11/19/2013 Time: 5329-9242 PT Time Calculation (min): 45 min Charge:  There ex [id number]; neuromuscular reed [id number] Visit#: 6 of 12  Re-eval: 12/04/13    Authorization: [facility]  Authorization Time Period: 08/19/2013 - 03/04/2014  Authorization Visit#: 6 of 12   Subjective: Symptoms/Limitations Symptoms: PT states that he is trying to do his exercises at home.     Exercise/Treatments        Balance Exercises Standing Tandem Stance: Eyes open;3 reps;30 secs SLS: Eyes open;3 reps Gait ): gt with cane concentrating on equalizing steps and hitting with heel  Marching: 10 reps Toe Raise: 10 reps;Limitations Toe Raise Limitations: reciprocal.  Sit to Stand Limitations: 10 Other Standing Exercises: standing at wall B UE flexion x 10;B abduction x 10;  side stepping x 2 RT  Other Standing Exercises: stand with abduction no hands x 10   Seated Other Seated Exercises: nustep Level 3 hills x 8'        Physical Therapy Assessment and Plan PT Assessment and Plan Clinical Impression Statement: Pt hs significant difficulty with heel toe gt therefore changed to heel toe stance and added standing activity without UE assis but with therapist facilitation.  Pt keeps Center of gravity ahead of his base of support completed standing toe raises to attempt to pull center of gravity back.  Pt had significant difficulty completing B toe raises so modified to one at a time.  PT Plan: begin wall bumps next treatment; begin rotation by bringing Rt UE into flexion while Lt UE goes into extension then switch    Goals  progressing  Problem List Patient Active Problem List   Diagnosis Date Noted  . Difficulty in walking(719.7) 11/04/2013  . Stiffness of joints, not elsewhere classified, multiple sites 11/04/2013  . Unspecified cerebral artery occlusion with cerebral infarction  07/15/2011  . Lack of coordination 07/15/2011  . ANEMIA 06/04/2010       GP    [name] 11/19/2013, 3:38 PM

## 2013-11-22 ENCOUNTER — Ambulatory Visit (HOSPITAL_COMMUNITY)
Admission: RE | Admit: 2013-11-22 | Discharge: 2013-11-22 | Disposition: A | Discharge status: Home / Self Care | Payer: Non-veteran care | Source: Ambulatory Visit | Attending: Internal Medicine | Admitting: Internal Medicine

## 2013-11-22 DIAGNOSIS — R262 Difficulty in walking, not elsewhere classified: Secondary | ICD-10-CM

## 2013-11-22 NOTE — Progress Notes (Signed)
Physical Therapy Treatment Patient Details  Name: Eric [name] MRN: [id number] Date of Birth: July 31, 1946  Today's Date: 11/22/2013 Time: 3536-1443 PT Time Calculation (min): 48 min Charge: NMR 1300-1312, Gait training [phone], TE [id number]   Visit#: 7 of 12  Re-eval: 12/04/13 Assessment Diagnosis: difficulty walking Prior Therapy: 2013  Authorization: [facility]  Authorization Time Period: 08/19/2013 - 03/04/2014  Authorization Visit#: 7 of 12   Subjective: Symptoms/Limitations Symptoms: Pain free, pt stated he feels he is walking better, increase ease with sit to stands. Pain Assessment Currently in Pain?: No/denies  Objective:   Exercise/Treatments Balance Exercises Standing Wall Bumps: Shoulder;Eyes opened;10 reps Gait with Head Turns (Round Trips): gt with cane concentrating on equalizing steps and hitting with heel in outdoor setting with incline/decline slopes, steps reciprocal pattery ascending, step to pattern descending, cueing for sequencing with SPC Cone Rotation: Solid surface;R/L;Limitations Cone Rotation Limitations: shoulder flexion at neutral with extension Bil UE with rotation Sit to Stand: Standard surface;Limitations Sit to Stand Limitations: 10 no HHA  Seated Other Seated Exercises: nustep Level 4 hills x 10''    Physical Therapy Assessment and Plan PT Assessment and Plan Clinical Impression Statement: Began rotation exercises and wall bumps to improve core strength and UE sequencing with gait training.  Pt continues to require cueing to increase Lt stride length and equalize stance phase.  Gait training complete in outdoor setting to improve gait on static and dynamic surfaces.  1 LOB episode during gait training with self recovery and min guard.   PT Plan: Continue with gait training activities to improve core strength and sequencing with UE.    Goals Home Exercise Program Pt/caregiver will Perform Home Exercise Program: For increased strengthening;For  increased ROM PT Short Term Goals Time to Complete Short Term Goals: 2 weeks PT Short Term Goal 1: Pt to be able to stand for 15 minutes wihout having to sit down PT Short Term Goal 1 - Progress: Progressing toward goal PT Short Term Goal 2: Pt berg score improved by 5 pts to allow pt to be safe ambulating inside without a cane. PT Short Term Goal 2 - Progress: Progressing toward goal PT Short Term Goal 3: Pt to be able to walk x 15 minutes to have better health habits. PT Short Term Goal 3 - Progress: Progressing toward goal PT Long Term Goals Time to Complete Long Term Goals: 4 weeks PT Long Term Goal 1: I in advance HEP PT Long Term Goal 2: Pt to be able to walk for 30 mintues in order to go shopping with his wife PT Long Term Goal 2 - Progress: Progressing toward goal Long Term Goal 3: Berg score improved by 8 to be safe walking without an assistive device. Long Term Goal 3 Progress: Progressing toward goal  Problem List Patient Active Problem List   Diagnosis Date Noted  . Difficulty in walking(719.7) 11/04/2013  . Stiffness of joints, not elsewhere classified, multiple sites 11/04/2013  . Unspecified cerebral artery occlusion with cerebral infarction 07/15/2011  . Lack of coordination 07/15/2011  . ANEMIA 06/04/2010    PT - End of Session Equipment Utilized During Treatment: Gait belt Activity Tolerance: Patient tolerated treatment well General Behavior During Therapy: [facility] for tasks assessed/performed  GP    Aldona Lento 11/22/2013, 1:47 PM

## 2013-11-24 ENCOUNTER — Ambulatory Visit (HOSPITAL_COMMUNITY)
Admission: RE | Admit: 2013-11-24 | Discharge: 2013-11-24 | Disposition: A | Discharge status: Home / Self Care | Payer: Non-veteran care | Source: Ambulatory Visit | Attending: *Deleted | Admitting: *Deleted

## 2013-11-24 NOTE — Progress Notes (Signed)
Physical Therapy Treatment Patient Details  Name: Eric [name] MRN: [id number] Date of Birth: 1946-05-25  Today's Date: 11/24/2013 Time: 2841-3244 PT Time Calculation (min): 38 min Charges: Therex x 15' [phone]) Gait x 10' [phone]) NMR x 12' [phone])  Visit#: 8 of 12  Re-eval: 12/04/13    Authorization: [facility]  Authorization Time Period: 08/19/2013 - 03/04/2014  Authorization Visit#: 8 of 12   Subjective: Symptoms/Limitations Symptoms: Pt states that he feels good today. Pt presents with SPC but was holding cane off ground while walking. Pain Assessment Currently in Pain?: No/denies   Exercise/Treatments  Balance Exercises Standing Wall Bumps: Shoulder;Eyes opened;10 reps Gait with Head Turns (Round Trips): Gait with SPC on incline/decline and through grass in outdoor setting x 1 RT focusing on step through pattern Cone Rotation: Solid surface;R/L;Limitations Cone Rotation Limitations: Used 14" box and hospital sidetable with cones during rotation to utilize flexion and rotation. Marching: 10 reps   Seated Other Seated Exercises: nustep Level 4 hills x 10''    Physical Therapy Assessment and Plan PT Assessment and Plan Clinical Impression Statement: SPTA facilitated strengthening and gait exercises to improve functional strength and gait mechanics. Continued NuStep L4 x 10 min to increase LE strength and activity tolerance. Continued marching exercises and shoulder wall bumps to increase LE strength and balance as well as core strength for gait. Pt was gait trained outdoors with SPC to improve balance control on difference surfaces. Pt reports that he has stairs that he has to maneuver in his home that are difficult for him. Pt was educated on the proper sequencing for ascending/descending stairs. Pt reports no pain after session, only fatigue. This entire session was guided, instructed, and directly supervised by Rachelle Hora, PTA. PT Plan: Continue with gait training  activities to improve core strength. Begin stair training with [place] next session.     Problem List Patient Active Problem List   Diagnosis Date Noted  . Difficulty in walking(719.7) 11/04/2013  . Stiffness of joints, not elsewhere classified, multiple sites 11/04/2013  . Unspecified cerebral artery occlusion with cerebral infarction 07/15/2011  . Lack of coordination 07/15/2011  . ANEMIA 06/04/2010    PT - End of Session Equipment Utilized During Treatment: Gait belt Activity Tolerance: Patient tolerated treatment well General Behavior During Therapy: [facility] for tasks assessed/performed   Ahmed Prima, SPTA 11/24/2013, 1:58 PM

## 2013-11-26 ENCOUNTER — Ambulatory Visit (HOSPITAL_COMMUNITY)
Admission: RE | Admit: 2013-11-26 | Discharge: 2013-11-26 | Disposition: A | Discharge status: Home / Self Care | Payer: Non-veteran care | Source: Ambulatory Visit | Attending: Internal Medicine | Admitting: Internal Medicine

## 2013-11-26 NOTE — Progress Notes (Signed)
Physical Therapy Treatment Patient Details  Name: Eric [name] MRN: [id number] Date of Birth: 1946/04/07  Today's Date: 11/26/2013 Time: 1300-1345 PT Time Calculation (min): 45 min  Visit#: 9 of 13  Re-eval: 12/04/13  charge:  Neuromuscular reed 1300-1310: neuromuscular reed 3220-2542  Authorization: [facility]  Authorization Time Period: 08/19/2013 - 03/04/2014  Authorization Visit#: 9 of 10   Subjective: Symptoms/Limitations Symptoms: No complaints   Exercise/Treatments     Balance Exercises Standing Tandem Stance: Eyes open SLS: Eyes open;5 reps Wall Bumps: Shoulder;Eyes opened;10 reps Gait with Head Turns (Round Trips): gt with cane concentrating on equalizing steps and hitting with heel in outdoor setting with incline/decline slopes, steps reciprocal pattery ascending, step to pattern descending, cueing for sequencing with SPC Tandem Gait: 2 reps Retro Gait: 2 reps Sidestepping: 2 reps (foam) Cone Rotation Limitations: opposit arm swing with trunk rotation to promote rotation x 10  Marching: 10 reps Heel Raises: 10 reps Toe Raise: 10 reps Toe Raise Limitations: reciprocal Sit to Stand: Standard surface;Limitations Sit to Stand Limitations: 10 no HHA Other Standing Exercises: standing with B UE flex x 10; sit to stand x 10      Seated Other Seated Exercises: nustep Level 4 hills x 10''   Supine       Physical Therapy Assessment and Plan PT Assessment and Plan Clinical Impression Statement: Pt needed facilitation with all exercises.  Pt continues to take uneven stride lengths.  Pt has improved activity tolerance as well as strength.   PT Plan: Continue with gait training activities to improve core strength. Begin stair training        Problem List Patient Active Problem List   Diagnosis Date Noted  . Difficulty in walking(719.7) 11/04/2013  . Stiffness of joints, not elsewhere classified, multiple sites 11/04/2013  . Unspecified cerebral artery occlusion with  cerebral infarction 07/15/2011  . Lack of coordination 07/15/2011  . ANEMIA 06/04/2010    PT - End of Session Equipment Utilized During Treatment: Gait belt Activity Tolerance: Patient tolerated treatment well  GP    [name] 11/26/2013, 4:45 PM

## 2013-11-29 ENCOUNTER — Ambulatory Visit (HOSPITAL_COMMUNITY)
Admission: RE | Admit: 2013-11-29 | Discharge: 2013-11-29 | Disposition: A | Discharge status: Home / Self Care | Payer: Non-veteran care | Source: Ambulatory Visit | Attending: Internal Medicine | Admitting: Internal Medicine

## 2013-11-29 DIAGNOSIS — R262 Difficulty in walking, not elsewhere classified: Secondary | ICD-10-CM

## 2013-11-29 NOTE — Progress Notes (Signed)
Physical Therapy Treatment Patient Details  Name: Eric [name] MRN: [id number] Date of Birth: March 22, 1946  Today's Date: 11/29/2013 Time: 1300-1345 PT Time Calculation (min): 45 min Charge:  Gt x 12; neuro reed x 32 Visit#: 10 of 13  Re-eval: 12/04/13    Authorization Visit#: 10 of 13   Subjective: Symptoms/Limitations Symptoms: Pt states he has been working on his walking  Therapist, nutritional Standing Tandem Stance: Eyes open SLS: Eyes open;5 reps SLS with Vectors: Solid surface;3 reps Gait with Head Turns (Round Trips): gt with cane concentrating on equalizing steps and hitting with heel in outdoor setting with incline/decline slopes, steps reciprocal pattery ascending, step to pattern descending, cueing for sequencing with SPC Tandem Gait: 2 reps Retro Gait: 2 reps Sidestepping: 2 reps Numbers 1-15: Foam;1 rep Cone Rotation: Foam Marching: 10 reps Toe Raise: 10 reps Toe Raise Limitations: reciprocal Sit to Stand: Standard surface;Limitations Sit to Stand Limitations: 10 no HHA Other Standing Exercises: standing with B UE flex x 10;  Yoga Poses    Seated Other Seated Exercises: nustep Level 4 hills x 10''   Supine       Physical Therapy Assessment and Plan PT Assessment and Plan Clinical Impression Statement: NOted improvement with walking but pt needs verbal cuing or he continues to take a short stride with Rt and  a long with the Lt.  Pt had difficulty with 1-15 but continues to show gains in balance  PT Plan: begin stair climbing next treatment.    Goals    Problem List Patient Active Problem List   Diagnosis Date Noted  . Difficulty in walking(719.7) 11/04/2013  . Stiffness of joints, not elsewhere classified, multiple sites 11/04/2013  . Unspecified cerebral artery occlusion with cerebral infarction 07/15/2011  . Lack of coordination 07/15/2011  . ANEMIA 06/04/2010       GP Functional  Assessment Tool Used: clinical judgement Functional Limitation: Mobility: Walking and moving around Mobility: Walking and Moving Around Current Status [phone]): At least 40 percent but less than 60 percent impaired, limited or restricted Mobility: Walking and Moving Around Goal Status [phone]): At least 40 percent but less than 60 percent impaired, limited or restricted  [name] 11/29/2013, 5:14 PM

## 2013-12-01 ENCOUNTER — Ambulatory Visit (HOSPITAL_COMMUNITY)
Admission: RE | Admit: 2013-12-01 | Discharge: 2013-12-01 | Disposition: A | Discharge status: Home / Self Care | Payer: Non-veteran care | Source: Ambulatory Visit | Attending: Physical Therapy | Admitting: Physical Therapy

## 2013-12-01 DIAGNOSIS — R262 Difficulty in walking, not elsewhere classified: Secondary | ICD-10-CM

## 2013-12-01 NOTE — Progress Notes (Signed)
Physical Therapy Treatment Patient Details  Name: Eric [name] MRN: [id number] Date of Birth: 1946/07/22  Today's Date: 12/01/2013 Time: 2774-1287 PT Time Calculation (min): 50 min Charge:  Elliot Cousin [phone]; neuro reed [phone] Visit#: 11 of 13  Re-eval: 12/04/13    Authorization: [facility]   Authorization Visit#: 11 of 13   Subjective: Symptoms/Limitations Symptoms: Pt states he is not tripping on his feet as much now   Exercise/Treatments   Balance Exercises Standing Tandem Stance: Eyes open SLS: Eyes open;5 reps SLS with Vectors: Solid surface;3 reps Gait with Head Turns (Round Trips): gt with cane concentrating on equalizing steps and hitting with heel in outdoor setting with incline/decline slopes, steps reciprocal pattery ascending, step to pattern descending, cueing for sequencing with SPC Tandem Gait: 2 reps Retro Gait: 2 reps Sidestepping: 2 reps Cone Rotation: Solid surface Cone Rotation Limitations: one high one low surface  Marching: 10 reps Toe Raise: 10 reps Sit to Stand: Standard surface;Limitations Sit to Stand Limitations: 10 no HHA Other Standing Exercises: standing with B UE flex x 10; Other Standing Exercises: steps x 12 (dept 2 rt)     Seated Other Seated Exercises: nustep Level 4 hills x 10''    Physical Therapy Assessment and Plan PT Assessment and Plan Clinical Impression Statement: Pt is not as flexed when walking as he was; pt taking longer strides without having to be verbally cued.  Pt was fatigued after this session  PT Plan: reassess next treatment .      Goals  progressing  Problem List Patient Active Problem List   Diagnosis Date Noted  . Difficulty in walking(719.7) 11/04/2013  . Stiffness of joints, not elsewhere classified, multiple sites 11/04/2013  . Unspecified cerebral artery occlusion with cerebral infarction 07/15/2011  . Lack of coordination 07/15/2011  . ANEMIA 06/04/2010    PT - End of Session Equipment Utilized During  Treatment: Gait belt PT Plan of Care PT Home Exercise Plan: given  GP    [name] 12/01/2013, 2:45 PM

## 2013-12-03 ENCOUNTER — Ambulatory Visit (HOSPITAL_COMMUNITY)
Admission: RE | Admit: 2013-12-03 | Discharge: 2013-12-03 | Disposition: A | Discharge status: Home / Self Care | Payer: Non-veteran care | Source: Ambulatory Visit | Attending: Internal Medicine | Admitting: Internal Medicine

## 2013-12-03 DIAGNOSIS — M545 Low back pain, unspecified: Secondary | ICD-10-CM | POA: Insufficient documentation

## 2013-12-03 DIAGNOSIS — Z8673 Personal history of transient ischemic attack (TIA), and cerebral infarction without residual deficits: Secondary | ICD-10-CM | POA: Insufficient documentation

## 2013-12-03 DIAGNOSIS — M79609 Pain in unspecified limb: Secondary | ICD-10-CM | POA: Insufficient documentation

## 2013-12-03 DIAGNOSIS — IMO0001 Reserved for inherently not codable concepts without codable children: Secondary | ICD-10-CM | POA: Insufficient documentation

## 2013-12-03 DIAGNOSIS — M25669 Stiffness of unspecified knee, not elsewhere classified: Secondary | ICD-10-CM | POA: Insufficient documentation

## 2013-12-03 DIAGNOSIS — R269 Unspecified abnormalities of gait and mobility: Secondary | ICD-10-CM | POA: Insufficient documentation

## 2013-12-03 DIAGNOSIS — R279 Unspecified lack of coordination: Secondary | ICD-10-CM | POA: Insufficient documentation

## 2013-12-03 NOTE — Progress Notes (Signed)
Physical Therapy Re-evaluation/ Discharge summary  Patient Details  Name: Eric [name] MRN: [id number] Date of Birth: 26-Dec-1945  Today's Date: 12/03/2013 Time: 2500-3704 PT Time Calculation (min): 41 min Charge: Physical performance testings 1304-1325, MMT 1325-1335, Self care (FOTO) no charge              Visit#: 12 of 13  Re-eval: 12/04/13 Assessment Diagnosis: difficulty walking Prior Therapy: 2013  Authorization: [facility]    Authorization Time Period: 08/19/2013 - 03/04/2014 12 visits for [facility]  Authorization Visit#: 12 of 13   Past Medical History:  Past Medical History  Diagnosis Date  . Stroke   . Arthritis   . HTN (hypertension)   . Constipation    Past Surgical History: No past surgical history on file.  Subjective Symptoms/Limitations Symptoms: Pt stated he feels his overall walking is improving.   How long can you sit comfortably?: no problem. able to sit for unlimited  How long can you stand comfortably?: Ab;e tp stand for 30 minutes (was able to stand for five minutes) How long can you walk comfortably?: Able to walk for 30 minutes (Able to walk about five minutes.) Pain Assessment Currently in Pain?: No/denies     Sensation/Coordination/Flexibility/Functional Tests Flexibility Thomas: Positive 90/90: Positive Functional Tests Functional Tests: foto 77% status, 33% limitation (foto 34)  Assessment RLE Strength Right Hip Flexion: 5/5 Right Hip Extension: 5/5 (was 3/5) Right Knee Flexion: 5/5 Right Knee Extension: 5/5 (was 4/5) Right Ankle Dorsiflexion: 5/5 (was 5-/5) LLE Strength Left Hip Flexion: 5/5 Left Hip Extension: 5/5 (was 3-/5) Left Hip ABduction: 5/5 Left Knee Flexion: 5/5 (was 4/5) Left Knee Extension: 5/5 (was 4/5) Left Ankle Dorsiflexion: 5/5  Exercise/Treatments Mobility/Balance  Berg Balance Test Sit to Stand: Able to stand without using hands and stabilize independently Standing Unsupported: Able to stand safely 2 minutes Sitting  with Back Unsupported but Feet Supported on Floor or Stool: Able to sit safely and securely 2 minutes Stand to Sit: Sits safely with minimal use of hands (was 3) Transfers: Able to transfer safely, minor use of hands (was 3) Standing Unsupported with Eyes Closed: Able to stand 10 seconds safely Standing Ubsupported with Feet Together: Able to place feet together independently and stand 1 minute safely (was 3) From Standing, Reach Forward with Outstretched Arm: Can reach forward >12 cm safely (5") (was 3) From Standing Position, Pick up Object from Floor: Able to pick up shoe safely and easily From Standing Position, Turn to Look Behind Over each Shoulder: Looks behind from both sides and weight shifts well (was 3) Turn 360 Degrees: Able to turn 360 degrees safely one side only in 4 seconds or less (was 2) Standing Unsupported, Alternately Place Feet on Step/Stool: Able to stand independently and complete 8 steps >20 seconds (was 2) Standing Unsupported, One Foot in Front: Able to take small step independently and hold 30 seconds (was 2) Standing on One Leg: Able to lift leg independently and hold 5-10 seconds (was 1) Total Score: 85  Was 42       Physical Therapy Assessment and Plan PT Assessment and Plan Clinical Impression Statement: Re-eval complete with the following findings:  Pt with improved strength Bil LE, improved gait mechanics with min cueing for posture and increased stride length initially, improved BERG Balance testings 50/56 (was 45/56).  Pt with improved perceived functioal abilities with score increase to 77% status (was 34% initial eval) PT Plan: D/C to HEP per all goals met.    Goals Home Exercise  Program Pt/caregiver will Perform Home Exercise Program: For increased strengthening;For increased ROM PT Short Term Goals Time to Complete Short Term Goals: 2 weeks PT Short Term Goal 1: Pt to be able to stand for 15 minutes wihout having to sit down PT Short Term Goal 1 -  Progress: Met PT Short Term Goal 2: Pt berg score improved by 5 pts to allow pt to be safe ambulating inside without a cane. PT Short Term Goal 2 - Progress: Met PT Short Term Goal 3: Pt to be able to walk x 15 minutes to have better health habits. PT Short Term Goal 3 - Progress: Met PT Long Term Goals Time to Complete Long Term Goals: 4 weeks PT Long Term Goal 1: I in advance HEP PT Long Term Goal 1 - Progress: Met PT Long Term Goal 2: Pt to be able to walk for 30 mintues in order to go shopping with his wife PT Long Term Goal 2 - Progress: Met Long Term Goal 3: Berg score improved by 8 to be safe walking without an assistive device. Long Term Goal 3 Progress: Met  Problem List Patient Active Problem List   Diagnosis Date Noted  . Difficulty in walking(719.7) 11/04/2013  . Stiffness of joints, not elsewhere classified, multiple sites 11/04/2013  . Unspecified cerebral artery occlusion with cerebral infarction 07/15/2011  . Lack of coordination 07/15/2011  . ANEMIA 06/04/2010    PT - End of Session Equipment Utilized During Treatment: Gait belt Activity Tolerance: Patient tolerated treatment well General Behavior During Therapy: WFL for tasks assessed/performed  GP Functional Assessment Tool Used: clinical judgement; FOTO 77% status, 33% limitation Functional Limitation: Mobility: Walking and moving around Mobility: Walking and Moving Around Goal Status [phone]): At least 40 percent but less than 60 percent impaired, limited or restricted Mobility: Walking and Moving Around Discharge Status [phone]): At least 20 percent but less than 40 percent impaired, limited or restricted  Aldona Lento 12/03/2013, 2:03 PM  Physician Documentation Your signature is required to indicate approval of the treatment plan as stated above.  Please sign and either send electronically or make a copy of this report for your files and return this physician signed original.   Please mark one  1.__approve of plan  2. ___approve of plan with the following conditions.   ______________________________                                                          _____________________ Physician Signature                                                                                                             Date

## 2014-09-30 ENCOUNTER — Emergency Department (HOSPITAL_COMMUNITY)
Admission: EM | Admit: 2014-09-30 | Discharge: 2014-09-30 | Disposition: A | Discharge status: Home / Self Care | Payer: Medicare Other | Attending: Emergency Medicine | Admitting: Emergency Medicine

## 2014-09-30 ENCOUNTER — Emergency Department (HOSPITAL_COMMUNITY): Payer: Medicare Other

## 2014-09-30 ENCOUNTER — Encounter (HOSPITAL_COMMUNITY): Payer: Self-pay | Admitting: Emergency Medicine

## 2014-09-30 DIAGNOSIS — Z791 Long term (current) use of non-steroidal anti-inflammatories (NSAID): Secondary | ICD-10-CM | POA: Insufficient documentation

## 2014-09-30 DIAGNOSIS — K5900 Constipation, unspecified: Secondary | ICD-10-CM | POA: Diagnosis not present

## 2014-09-30 DIAGNOSIS — M1990 Unspecified osteoarthritis, unspecified site: Secondary | ICD-10-CM | POA: Diagnosis not present

## 2014-09-30 DIAGNOSIS — Y9389 Activity, other specified: Secondary | ICD-10-CM | POA: Insufficient documentation

## 2014-09-30 DIAGNOSIS — Y9289 Other specified places as the place of occurrence of the external cause: Secondary | ICD-10-CM | POA: Diagnosis not present

## 2014-09-30 DIAGNOSIS — S8992XA Unspecified injury of left lower leg, initial encounter: Secondary | ICD-10-CM | POA: Diagnosis not present

## 2014-09-30 DIAGNOSIS — I10 Essential (primary) hypertension: Secondary | ICD-10-CM | POA: Insufficient documentation

## 2014-09-30 DIAGNOSIS — W010XXA Fall on same level from slipping, tripping and stumbling without subsequent striking against object, initial encounter: Secondary | ICD-10-CM | POA: Diagnosis not present

## 2014-09-30 DIAGNOSIS — Z8673 Personal history of transient ischemic attack (TIA), and cerebral infarction without residual deficits: Secondary | ICD-10-CM | POA: Diagnosis not present

## 2014-09-30 DIAGNOSIS — Y998 Other external cause status: Secondary | ICD-10-CM | POA: Insufficient documentation

## 2014-09-30 DIAGNOSIS — M25562 Pain in left knee: Secondary | ICD-10-CM

## 2014-09-30 DIAGNOSIS — Z7982 Long term (current) use of aspirin: Secondary | ICD-10-CM | POA: Diagnosis not present

## 2014-09-30 DIAGNOSIS — S8991XA Unspecified injury of right lower leg, initial encounter: Secondary | ICD-10-CM | POA: Insufficient documentation

## 2014-09-30 DIAGNOSIS — Z79899 Other long term (current) drug therapy: Secondary | ICD-10-CM | POA: Diagnosis not present

## 2014-09-30 DIAGNOSIS — M25561 Pain in right knee: Secondary | ICD-10-CM

## 2014-09-30 DIAGNOSIS — Z720 Tobacco use: Secondary | ICD-10-CM | POA: Insufficient documentation

## 2014-09-30 MED ORDER — HYDROCODONE-ACETAMINOPHEN 5-325 MG PO TABS: 1.0000 | ORAL_TABLET | Freq: Four times a day (QID) | ORAL | Status: DC | PRN

## 2014-09-30 MED ORDER — HYDROCODONE-ACETAMINOPHEN 5-325 MG PO TABS
2.0000 | ORAL_TABLET | Freq: Once | ORAL | Status: AC
  Administered 2014-09-30: 2 via ORAL
  Filled 2014-09-30: qty 2

## 2014-09-30 NOTE — ED Notes (Signed)
Pt reports falling yesterday. C/O pain to both knees.

## 2014-09-30 NOTE — ED Notes (Signed)
BP recycled 127/72.

## 2014-09-30 NOTE — Discharge Instructions (Signed)
Arthralgia °Your caregiver has diagnosed you as suffering from an arthralgia. Arthralgia means there is pain in a joint. This can come from many reasons including: °· Bruising the joint which causes soreness (inflammation) in the joint. °· Wear and tear on the joints which occur as we grow older (osteoarthritis). °· Overusing the joint. °· Various forms of arthritis. °· Infections of the joint. °Regardless of the cause of pain in your joint, most of these different pains respond to anti-inflammatory drugs and rest. The exception to this is when a joint is infected, and these cases are treated with antibiotics, if it is a bacterial infection. °HOME CARE INSTRUCTIONS  °· Rest the injured area for as long as directed by your caregiver. Then slowly start using the joint as directed by your caregiver and as the pain allows. Crutches as directed may be useful if the ankles, knees or hips are involved. If the knee was splinted or casted, continue use and care as directed. If an stretchy or elastic wrapping bandage has been applied today, it should be removed and re-applied every 3 to 4 hours. It should not be applied tightly, but firmly enough to keep swelling down. Watch toes and feet for swelling, bluish discoloration, coldness, numbness or excessive pain. If any of these problems (symptoms) occur, remove the ace bandage and re-apply more loosely. If these symptoms persist, contact your caregiver or return to this location. °· For the first 24 hours, keep the injured extremity elevated on pillows while lying down. °· Apply ice for 15-20 minutes to the sore joint every couple hours while awake for the first half day. Then 03-04 times per day for the first 48 hours. Put the ice in a plastic bag and place a towel between the bag of ice and your skin. °· Wear any splinting, casting, elastic bandage applications, or slings as instructed. °· Only take over-the-counter or prescription medicines for pain, discomfort, or fever as  directed by your caregiver. Do not use aspirin immediately after the injury unless instructed by your physician. Aspirin can cause increased bleeding and bruising of the tissues. °· If you were given crutches, continue to use them as instructed and do not resume weight bearing on the sore joint until instructed. °Persistent pain and inability to use the sore joint as directed for more than 2 to 3 days are warning signs indicating that you should see a caregiver for a follow-up visit as soon as possible. Initially, a hairline fracture (break in bone) may not be evident on X-rays. Persistent pain and swelling indicate that further evaluation, non-weight bearing or use of the joint (use of crutches or slings as instructed), or further X-rays are indicated. X-rays may sometimes not show a small fracture until a week or 10 days later. Make a follow-up appointment with your own caregiver or one to whom we have referred you. A radiologist (specialist in reading X-rays) may read your X-rays. Make sure you know how you are to obtain your X-ray results. Do not assume everything is normal if you do not hear from us. °SEEK MEDICAL CARE IF: °Bruising, swelling, or pain increases. °SEEK IMMEDIATE MEDICAL CARE IF:  °· Your fingers or toes are numb or blue. °· The pain is not responding to medications and continues to stay the same or get worse. °· The pain in your joint becomes severe. °· You develop a fever over 102° F (38.9° C). °· It becomes impossible to move or use the joint. °MAKE SURE YOU:  °·   Understand these instructions.  Will watch your condition.  Will get help right away if you are not doing well or get worse. Document Released: 07/22/2005 Document Revised: 10/14/2011 Document Reviewed: 03/09/2008 [facility] Patient Information 2015 [place], [place]. This information is not intended to replace advice given to you by your health care provider. Make sure you discuss any questions you have with your health care  provider.   Contusion A contusion is a deep bruise. Contusions are the result of an injury that caused bleeding under the skin. The contusion may turn blue, purple, or yellow. Minor injuries will give you a painless contusion, but more severe contusions may stay painful and swollen for a few weeks.  CAUSES  A contusion is usually caused by a blow, trauma, or direct force to an area of the body. SYMPTOMS   Swelling and redness of the injured area.  Bruising of the injured area.  Tenderness and soreness of the injured area.  Pain. DIAGNOSIS  The diagnosis can be made by taking a history and physical exam. An X-ray, CT scan, or MRI may be needed to determine if there were any associated injuries, such as fractures. TREATMENT  Specific treatment will depend on what area of the body was injured. In general, the best treatment for a contusion is resting, icing, elevating, and applying cold compresses to the injured area. Over-the-counter medicines may also be recommended for pain control. Ask your caregiver what the best treatment is for your contusion. HOME CARE INSTRUCTIONS   Put ice on the injured area.  Put ice in a plastic bag.  Place a towel between your skin and the bag.  Leave the ice on for 15-20 minutes, 3-4 times a day, or as directed by your health care provider.  Only take over-the-counter or prescription medicines for pain, discomfort, or fever as directed by your caregiver. Your caregiver may recommend avoiding anti-inflammatory medicines (aspirin, ibuprofen, and naproxen) for 48 hours because these medicines may increase bruising.  Rest the injured area.  If possible, elevate the injured area to reduce swelling. SEEK IMMEDIATE MEDICAL CARE IF:   You have increased bruising or swelling.  You have pain that is getting worse.  Your swelling or pain is not relieved with medicines. MAKE SURE YOU:   Understand these instructions.  Will watch your condition.  Will  get help right away if you are not doing well or get worse. Document Released: 05/01/2005 Document Revised: 07/27/2013 Document Reviewed: 05/27/2011 [facility] Patient Information 2015 [place], [place]. This information is not intended to replace advice given to you by your health care provider. Make sure you discuss any questions you have with your health care provider.    RICE: Routine Care for Injuries The routine care of many injuries includes Rest, Ice, Compression, and Elevation (RICE). HOME CARE INSTRUCTIONS  Rest is needed to allow your body to heal. Routine activities can usually be resumed when comfortable. Injured tendons and bones can take up to 6 weeks to heal. Tendons are the cord-like structures that attach muscle to bone.  Ice following an injury helps keep the swelling down and reduces pain.  Put ice in a plastic bag.  Place a towel between your skin and the bag.  Leave the ice on for 15-20 minutes, 3-4 times a day, or as directed by your health care provider. Do this while awake, for the first 24 to 48 hours. After that, continue as directed by your caregiver.  Compression helps keep swelling down. It also gives  support and helps with discomfort. If an elastic bandage has been applied, it should be removed and reapplied every 3 to 4 hours. It should not be applied tightly, but firmly enough to keep swelling down. Watch fingers or toes for swelling, bluish discoloration, coldness, numbness, or excessive pain. If any of these problems occur, remove the bandage and reapply loosely. Contact your caregiver if these problems continue.  Elevation helps reduce swelling and decreases pain. With extremities, such as the arms, hands, legs, and feet, the injured area should be placed near or above the level of the heart, if possible. SEEK IMMEDIATE MEDICAL CARE IF:  You have persistent pain and swelling.  You develop redness, numbness, or unexpected weakness.  Your symptoms are  getting worse rather than improving after several days. These symptoms may indicate that further evaluation or further X-rays are needed. Sometimes, X-rays may not show a small broken bone (fracture) until 1 week or 10 days later. Make a follow-up appointment with your caregiver. Ask when your X-ray results will be ready. Make sure you get your X-ray results. Document Released: 11/03/2000 Document Revised: 07/27/2013 Document Reviewed: 12/21/2010 [facility] Patient Information 2015 [place], [place]. This information is not intended to replace advice given to you by your health care provider. Make sure you discuss any questions you have with your health care provider.

## 2014-09-30 NOTE — ED Provider Notes (Signed)
This chart was scribed for Ly[place], DO by Einar Pheasant, ED Scribe. This patient was seen in room APA03/APA03 and the patient's care was started at 7:20 AM.  TIME SEEN: 7:20 AM  CHIEF COMPLAINT: Knee pain  HPI:  HPI Comments: Eric [name] is a 69 y.o. male with PMhx of HTN, arthritis and stroke which resulted in numbness to his anterior left leg and inability to use his right hand, presents to the Emergency Department complaining of sudden onset bilateral knee pain that started yesterday after a fall. States he tripped and fell.  Described as achy, moderate without radiation.  Worse with walking.  He states that he was behind his house when he tripped and fell, falling unto his knees. States that he crawled back to the front of the house and used the porch to get up. He denies any head trauma or any LOC. Pt also endorses associated left sided back pain. He reports taking gabapentin and naproxen without adequate relief of his pain. He denies neck pain. No bowel or bladder incontinence. No new numbness, tingling or focal weakness. Not on anticoagulation.  ROS: See HPI Constitutional: no fever  Eyes: no drainage  ENT: no runny nose   Cardiovascular:  no chest pain  Resp: no SOB  GI: no vomiting GU: no dysuria Integumentary: no rash  Allergy: no hives  Musculoskeletal: Positive arthralgias and back pain. no leg swelling  Neurological: no slurred speech ROS otherwise negative  PAST MEDICAL HISTORY/PAST SURGICAL HISTORY:  Past Medical History  Diagnosis Date  . Stroke   . Arthritis   . HTN (hypertension)   . Constipation     MEDICATIONS:  Prior to Admission medications   Medication Sig Start Date End Date Taking? Authorizing Provider  amLODipine (NORVASC) 5 MG tablet Take 5 mg by mouth daily.      Historical Provider, MD  aspirin EC 81 MG tablet Take 81 mg by mouth daily.    Historical Provider, MD  gabapentin (NEURONTIN) 300 MG capsule Take 300 mg by mouth 2 (two) times  daily.    Historical Provider, MD  hydrochlorothiazide (HYDRODIURIL) 25 MG tablet Take 25 mg by mouth daily.    Historical Provider, MD  HYDROcodone-acetaminophen (NORCO/VICODIN) 5-325 MG per tablet Take 1 tablet by mouth every 6 (six) hours as needed. 08/10/13   Hope Bunnie Pion, NP  meloxicam (MOBIC) 7.5 MG tablet Take 1 tablet (7.5 mg total) by mouth daily. 08/10/13   Hope Bunnie Pion, NP  omeprazole (PRILOSEC) 20 MG capsule Take 20 mg by mouth daily.    Historical Provider, MD  polyethylene glycol (MIRALAX / GLYCOLAX) packet Take 17 g by mouth daily. 10/13/12   Sharyon Cable, MD    ALLERGIES:  No Known Allergies  SOCIAL HISTORY:  History  Substance Use Topics  . Smoking status: Current Some Day Smoker    Types: Cigarettes  . Smokeless tobacco: Not on file  . Alcohol Use: No    FAMILY HISTORY: Family History  Problem Relation Age of Onset  . Colon polyps Neg Hx     EXAM: BP 152/105 mmHg  Pulse 85  Temp(Src) 98.4 F (36.9 C) (Oral)  Resp 20  Ht 5\' 8"  (1.727 m)  Wt 202 lb (91.627 kg)  BMI 30.72 kg/m2  SpO2 98% CONSTITUTIONAL: Alert and oriented and responds appropriately to questions. Well-appearing; well-nourished; GCS 15 HEAD: Normocephalic; atraumatic EYES: Conjunctivae clear, PERRL, EOMI ENT: normal nose; no rhinorrhea; moist mucous membranes; pharynx without lesions noted; no  dental injury; no septal hematoma NECK: Supple, no meningismus, no LAD; no midline spinal tenderness, step-off or deformity CARD: RRR; S1 and S2 appreciated; no murmurs, no clicks, no rubs, no gallops RESP: Normal chest excursion without splinting or tachypnea; breath sounds clear and equal bilaterally; no wheezes, no rhonchi, no rales; chest wall stable, nontender to palpation ABD/GI: Normal bowel sounds; non-distended; soft, non-tender, no rebound, no guarding PELVIS:  stable, nontender to palpation BACK:  The back appears normal and is tender to palpation over the left thoracic paraspinal musculature  without obvious deformity or lesions, there is no CVA tenderness; no midline spinal tenderness, step-off or deformity EXT: Normal ROM in all joints; very mildly tenderness over the anterior bilateral knee. Without ecchymosis or deformity. No effusion. DP pulses 2+ bilaterally; some mild left thoracic paraspinal tenderness without lesions or deformity; no edema; normal capillary refill; no cyanosis; otherwise extremities are nontender to palpation    SKIN: Normal color for age and race; warm NEURO: Moves all extremities equally; sensation to light touch intact diffusely with exception to the medial lower portion of the left anterior leg. Cranial nerves II through XII intact. PSYCH: The patient's mood and manner are appropriate. Grooming and personal hygiene are appropriate.  MEDICAL DECISION MAKING: Patient here with mechanical fall falling on the both knees yesterday. Does have a history of arthritis and is on naproxen and gabapentin. States he fell landing on both knees. He does have mild tenderness to palpation without bony deformity. Will obtain x-rays of bilateral knees. He is also complaining of left thoracic back pain but there is no midline spinal tenderness or step-off or deformity. He has no new neurologic deficits. He is hemodynamically stable. We'll give pain medication.  ED PROGRESS:   X-ray showed no acute injury. He has degenerative changes. We'll discharge with prescription for Vicodin and instructions for elevation, ice, rest. He normally ambulates with a cane. I do not feel he needs crutches at this time. He is been able to ambulate since the fall. Discussed return precautions. He verbalizes understanding and is comfortable with plan.     I personally performed the services described in this documentation, which was scribed in my presence. The recorded information has been reviewed and is accurate.    [place], DO 09/30/14 [phone]

## 2015-01-29 ENCOUNTER — Inpatient Hospital Stay (HOSPITAL_COMMUNITY)
Admission: EM | Admit: 2015-01-29 | Discharge: 2015-01-31 | DRG: 872 | Disposition: A | Discharge status: Home Health | Payer: Medicare Other | Attending: Internal Medicine | Admitting: Internal Medicine

## 2015-01-29 ENCOUNTER — Inpatient Hospital Stay (HOSPITAL_BASED_OUTPATIENT_CLINIC_OR_DEPARTMENT_OTHER): Payer: Medicare Other

## 2015-01-29 ENCOUNTER — Emergency Department (HOSPITAL_COMMUNITY): Payer: Medicare Other

## 2015-01-29 ENCOUNTER — Encounter (HOSPITAL_COMMUNITY): Payer: Self-pay

## 2015-01-29 DIAGNOSIS — D696 Thrombocytopenia, unspecified: Secondary | ICD-10-CM | POA: Diagnosis present

## 2015-01-29 DIAGNOSIS — E861 Hypovolemia: Secondary | ICD-10-CM | POA: Diagnosis present

## 2015-01-29 DIAGNOSIS — K5900 Constipation, unspecified: Secondary | ICD-10-CM | POA: Diagnosis present

## 2015-01-29 DIAGNOSIS — I69351 Hemiplegia and hemiparesis following cerebral infarction affecting right dominant side: Secondary | ICD-10-CM

## 2015-01-29 DIAGNOSIS — I5189 Other ill-defined heart diseases: Secondary | ICD-10-CM | POA: Diagnosis present

## 2015-01-29 DIAGNOSIS — G629 Polyneuropathy, unspecified: Secondary | ICD-10-CM | POA: Diagnosis present

## 2015-01-29 DIAGNOSIS — R739 Hyperglycemia, unspecified: Secondary | ICD-10-CM | POA: Diagnosis present

## 2015-01-29 DIAGNOSIS — I519 Heart disease, unspecified: Secondary | ICD-10-CM | POA: Diagnosis not present

## 2015-01-29 DIAGNOSIS — R9431 Abnormal electrocardiogram [ECG] [EKG]: Secondary | ICD-10-CM

## 2015-01-29 DIAGNOSIS — A419 Sepsis, unspecified organism: Principal | ICD-10-CM

## 2015-01-29 DIAGNOSIS — R5381 Other malaise: Secondary | ICD-10-CM

## 2015-01-29 DIAGNOSIS — F17210 Nicotine dependence, cigarettes, uncomplicated: Secondary | ICD-10-CM | POA: Diagnosis present

## 2015-01-29 DIAGNOSIS — D72819 Decreased white blood cell count, unspecified: Secondary | ICD-10-CM | POA: Diagnosis present

## 2015-01-29 DIAGNOSIS — R509 Fever, unspecified: Secondary | ICD-10-CM

## 2015-01-29 DIAGNOSIS — I10 Essential (primary) hypertension: Secondary | ICD-10-CM | POA: Diagnosis present

## 2015-01-29 DIAGNOSIS — A499 Bacterial infection, unspecified: Secondary | ICD-10-CM | POA: Diagnosis present

## 2015-01-29 DIAGNOSIS — R768 Other specified abnormal immunological findings in serum: Secondary | ICD-10-CM | POA: Diagnosis present

## 2015-01-29 DIAGNOSIS — R6510 Systemic inflammatory response syndrome (SIRS) of non-infectious origin without acute organ dysfunction: Secondary | ICD-10-CM | POA: Diagnosis present

## 2015-01-29 DIAGNOSIS — R61 Generalized hyperhidrosis: Secondary | ICD-10-CM | POA: Diagnosis present

## 2015-01-29 DIAGNOSIS — N390 Urinary tract infection, site not specified: Secondary | ICD-10-CM | POA: Diagnosis present

## 2015-01-29 DIAGNOSIS — R42 Dizziness and giddiness: Secondary | ICD-10-CM | POA: Diagnosis not present

## 2015-01-29 DIAGNOSIS — N179 Acute kidney failure, unspecified: Secondary | ICD-10-CM | POA: Diagnosis present

## 2015-01-29 DIAGNOSIS — B9689 Other specified bacterial agents as the cause of diseases classified elsewhere: Secondary | ICD-10-CM | POA: Diagnosis present

## 2015-01-29 DIAGNOSIS — R531 Weakness: Secondary | ICD-10-CM

## 2015-01-29 DIAGNOSIS — M1990 Unspecified osteoarthritis, unspecified site: Secondary | ICD-10-CM | POA: Diagnosis present

## 2015-01-29 HISTORY — DX: Anemia, unspecified: D64.9

## 2015-01-29 HISTORY — DX: Cerebral infarction due to unspecified occlusion or stenosis of unspecified cerebral artery: I63.50

## 2015-01-29 HISTORY — DX: Decreased white blood cell count, unspecified: D72.819

## 2015-01-29 HISTORY — DX: Abnormal electrocardiogram (ECG) (EKG): R94.31

## 2015-01-29 HISTORY — DX: Thrombocytopenia, unspecified: D69.6

## 2015-01-29 HISTORY — DX: Stiffness of unspecified joint, not elsewhere classified: M25.60

## 2015-01-29 HISTORY — DX: Other ill-defined heart diseases: I51.89

## 2015-01-29 HISTORY — DX: Other specified abnormal immunological findings in serum: R76.8

## 2015-01-29 LAB — CBC WITH DIFFERENTIAL/PLATELET
Basophils Absolute: 0 10*3/uL (ref 0.0–0.1)
Basophils Relative: 0 % (ref 0–1)
EOS ABS: 0 10*3/uL (ref 0.0–0.7)
Eosinophils Relative: 0 % (ref 0–5)
HCT: 41.6 % (ref 39.0–52.0)
Hemoglobin: 13.8 g/dL (ref 13.0–17.0)
LYMPHS ABS: 0.5 10*3/uL — AB (ref 0.7–4.0)
Lymphocytes Relative: 13 % (ref 12–46)
MCH: 31 pg (ref 26.0–34.0)
MCHC: 33.2 g/dL (ref 30.0–36.0)
MCV: 93.5 fL (ref 78.0–100.0)
Monocytes Absolute: 0.1 10*3/uL (ref 0.1–1.0)
Monocytes Relative: 4 % (ref 3–12)
Neutro Abs: 2.9 10*3/uL (ref 1.7–7.7)
Neutrophils Relative %: 83 % — ABNORMAL HIGH (ref 43–77)
Platelets: 117 10*3/uL — ABNORMAL LOW (ref 150–400)
RBC: 4.45 MIL/uL (ref 4.22–5.81)
RDW: 14.1 % (ref 11.5–15.5)
WBC: 3.5 10*3/uL — ABNORMAL LOW (ref 4.0–10.5)

## 2015-01-29 LAB — COMPREHENSIVE METABOLIC PANEL
ALBUMIN: 3.7 g/dL (ref 3.5–5.0)
ALT: 42 U/L (ref 17–63)
ANION GAP: 10 (ref 5–15)
AST: 59 U/L — ABNORMAL HIGH (ref 15–41)
Alkaline Phosphatase: 85 U/L (ref 38–126)
BUN: 22 mg/dL — ABNORMAL HIGH (ref 6–20)
CHLORIDE: 100 mmol/L — AB (ref 101–111)
CO2: 25 mmol/L (ref 22–32)
Calcium: 9.1 mg/dL (ref 8.9–10.3)
Creatinine, Ser: 1.52 mg/dL — ABNORMAL HIGH (ref 0.61–1.24)
GFR calc Af Amer: 53 mL/min — ABNORMAL LOW (ref >60)
GFR calc non Af Amer: 45 mL/min — ABNORMAL LOW (ref >60)
Glucose, Bld: 129 mg/dL — ABNORMAL HIGH (ref 65–99)
POTASSIUM: 3.6 mmol/L (ref 3.5–5.1)
Sodium: 135 mmol/L (ref 135–145)
Total Bilirubin: 1.1 mg/dL (ref 0.3–1.2)
Total Protein: 8.3 g/dL — ABNORMAL HIGH (ref 6.5–8.1)

## 2015-01-29 LAB — I-STAT CHEM 8, ED
BUN: 21 mg/dL — ABNORMAL HIGH (ref 6–20)
Calcium, Ion: 1.14 mmol/L (ref 1.13–1.30)
Chloride: 102 mmol/L (ref 101–111)
Creatinine, Ser: 1.7 mg/dL — ABNORMAL HIGH (ref 0.61–1.24)
Glucose, Bld: 131 mg/dL — ABNORMAL HIGH (ref 65–99)
HCT: 47 % (ref 39.0–52.0)
HEMOGLOBIN: 16 g/dL (ref 13.0–17.0)
Potassium: 3.7 mmol/L (ref 3.5–5.1)
Sodium: 137 mmol/L (ref 135–145)
TCO2: 21 mmol/L (ref 0–100)

## 2015-01-29 LAB — URINALYSIS, ROUTINE W REFLEX MICROSCOPIC
Bilirubin Urine: NEGATIVE
Glucose, UA: NEGATIVE mg/dL
Ketones, ur: NEGATIVE mg/dL
Leukocytes, UA: NEGATIVE
NITRITE: NEGATIVE
Protein, ur: 100 mg/dL — AB
SPECIFIC GRAVITY, URINE: 1.025 (ref 1.005–1.030)
Urobilinogen, UA: 2 mg/dL — ABNORMAL HIGH (ref 0.0–1.0)
pH: 6 (ref 5.0–8.0)

## 2015-01-29 LAB — TROPONIN I
Troponin I: 0.04 ng/mL — ABNORMAL HIGH (ref <0.031)
Troponin I: 0.03 ng/mL (ref <0.031)

## 2015-01-29 LAB — URINE MICROSCOPIC-ADD ON

## 2015-01-29 LAB — TSH: TSH: 0.98 u[IU]/mL (ref 0.350–4.500)

## 2015-01-29 LAB — I-STAT TROPONIN, ED: Troponin i, poc: 0.03 ng/mL (ref 0.00–0.08)

## 2015-01-29 LAB — LACTIC ACID, PLASMA: LACTIC ACID, VENOUS: 1.3 mmol/L (ref 0.5–2.0)

## 2015-01-29 LAB — I-STAT CG4 LACTIC ACID, ED: Lactic Acid, Venous: 2.26 mmol/L (ref 0.5–2.0)

## 2015-01-29 LAB — CBG MONITORING, ED: Glucose-Capillary: 131 mg/dL — ABNORMAL HIGH (ref 65–99)

## 2015-01-29 MED ORDER — ACETAMINOPHEN 500 MG PO TABS
1000.0000 mg | ORAL_TABLET | Freq: Once | ORAL | Status: AC
  Administered 2015-01-29: 1000 mg via ORAL

## 2015-01-29 MED ORDER — VANCOMYCIN HCL IN DEXTROSE 750-5 MG/150ML-% IV SOLN
750.0000 mg | Freq: Two times a day (BID) | INTRAVENOUS | Status: DC
  Administered 2015-01-30 (×3): 750 mg via INTRAVENOUS
  Filled 2015-01-29 (×6): qty 150

## 2015-01-29 MED ORDER — VANCOMYCIN HCL IN DEXTROSE 1-5 GM/200ML-% IV SOLN
1000.0000 mg | Freq: Once | INTRAVENOUS | Status: AC
  Administered 2015-01-29: 1000 mg via INTRAVENOUS
  Filled 2015-01-29: qty 200

## 2015-01-29 MED ORDER — GUAIFENESIN-DM 100-10 MG/5ML PO SYRP: 5.0000 mL | ORAL_SOLUTION | ORAL | Status: DC | PRN

## 2015-01-29 MED ORDER — HYDROCODONE-ACETAMINOPHEN 5-325 MG PO TABS: 1.0000 | ORAL_TABLET | ORAL | Status: DC | PRN

## 2015-01-29 MED ORDER — ASPIRIN 81 MG PO CHEW
324.0000 mg | CHEWABLE_TABLET | Freq: Once | ORAL | Status: AC
  Administered 2015-01-29: 324 mg via ORAL

## 2015-01-29 MED ORDER — ACETAMINOPHEN 500 MG PO TABS
ORAL_TABLET | ORAL | Status: AC
  Filled 2015-01-29: qty 2

## 2015-01-29 MED ORDER — PANTOPRAZOLE SODIUM 40 MG PO TBEC
40.0000 mg | DELAYED_RELEASE_TABLET | Freq: Every day | ORAL | Status: DC
  Administered 2015-01-30 – 2015-01-31 (×2): 40 mg via ORAL
  Filled 2015-01-29 (×2): qty 1

## 2015-01-29 MED ORDER — ALUM & MAG HYDROXIDE-SIMETH 200-200-20 MG/5ML PO SUSP: 30.0000 mL | Freq: Four times a day (QID) | ORAL | Status: DC | PRN

## 2015-01-29 MED ORDER — SENNOSIDES-DOCUSATE SODIUM 8.6-50 MG PO TABS
1.0000 | ORAL_TABLET | Freq: Every day | ORAL | Status: DC
  Administered 2015-01-29 – 2015-01-30 (×2): 1 via ORAL
  Filled 2015-01-29 (×2): qty 1

## 2015-01-29 MED ORDER — AMLODIPINE BESYLATE 5 MG PO TABS
5.0000 mg | ORAL_TABLET | Freq: Every day | ORAL | Status: DC
  Administered 2015-01-30 – 2015-01-31 (×2): 5 mg via ORAL
  Filled 2015-01-29 (×2): qty 1

## 2015-01-29 MED ORDER — ASPIRIN 81 MG PO CHEW
CHEWABLE_TABLET | ORAL | Status: AC
  Filled 2015-01-29: qty 4

## 2015-01-29 MED ORDER — ASPIRIN EC 81 MG PO TBEC: 81.0000 mg | DELAYED_RELEASE_TABLET | Freq: Every day | ORAL | Status: DC

## 2015-01-29 MED ORDER — PIPERACILLIN-TAZOBACTAM 3.375 G IVPB
3.3750 g | Freq: Once | INTRAVENOUS | Status: AC
  Administered 2015-01-29: 3.375 g via INTRAVENOUS
  Filled 2015-01-29: qty 50

## 2015-01-29 MED ORDER — ASPIRIN EC 81 MG PO TBEC
81.0000 mg | DELAYED_RELEASE_TABLET | Freq: Every day | ORAL | Status: DC
  Administered 2015-01-30 – 2015-01-31 (×2): 81 mg via ORAL
  Filled 2015-01-29 (×2): qty 1

## 2015-01-29 MED ORDER — ONDANSETRON HCL 4 MG PO TABS: 4.0000 mg | ORAL_TABLET | Freq: Four times a day (QID) | ORAL | Status: DC | PRN

## 2015-01-29 MED ORDER — ACETAMINOPHEN 325 MG PO TABS: 650.0000 mg | ORAL_TABLET | Freq: Four times a day (QID) | ORAL | Status: DC | PRN

## 2015-01-29 MED ORDER — ENOXAPARIN SODIUM 40 MG/0.4ML ~~LOC~~ SOLN: 40.0000 mg | SUBCUTANEOUS | Status: DC

## 2015-01-29 MED ORDER — POTASSIUM CHLORIDE IN NACL 20-0.9 MEQ/L-% IV SOLN
INTRAVENOUS | Status: DC
Start: 2015-01-29 — End: 2015-01-31
  Administered 2015-01-29 – 2015-01-30 (×2): via INTRAVENOUS

## 2015-01-29 MED ORDER — LEVALBUTEROL HCL 0.63 MG/3ML IN NEBU: 0.6300 mg | INHALATION_SOLUTION | Freq: Four times a day (QID) | RESPIRATORY_TRACT | Status: DC | PRN

## 2015-01-29 MED ORDER — SODIUM CHLORIDE 0.9 % IV BOLUS (SEPSIS)
1000.0000 mL | Freq: Once | INTRAVENOUS | Status: AC
  Administered 2015-01-29: 1000 mL via INTRAVENOUS

## 2015-01-29 MED ORDER — ACETAMINOPHEN 650 MG RE SUPP: 650.0000 mg | Freq: Four times a day (QID) | RECTAL | Status: DC | PRN

## 2015-01-29 MED ORDER — ENOXAPARIN SODIUM 30 MG/0.3ML ~~LOC~~ SOLN
30.0000 mg | SUBCUTANEOUS | Status: DC
  Administered 2015-01-29: 30 mg via SUBCUTANEOUS
  Filled 2015-01-29: qty 0.3

## 2015-01-29 MED ORDER — PIPERACILLIN-TAZOBACTAM 3.375 G IVPB
3.3750 g | Freq: Three times a day (TID) | INTRAVENOUS | Status: DC
  Administered 2015-01-29 – 2015-01-31 (×6): 3.375 g via INTRAVENOUS
  Filled 2015-01-29 (×10): qty 50

## 2015-01-29 MED ORDER — GABAPENTIN 300 MG PO CAPS
300.0000 mg | ORAL_CAPSULE | Freq: Two times a day (BID) | ORAL | Status: DC
  Administered 2015-01-29 – 2015-01-31 (×4): 300 mg via ORAL
  Filled 2015-01-29 (×4): qty 1

## 2015-01-29 MED ORDER — ONDANSETRON HCL 4 MG/2ML IJ SOLN: 4.0000 mg | Freq: Four times a day (QID) | INTRAMUSCULAR | Status: DC | PRN

## 2015-01-29 NOTE — ED Notes (Signed)
Admitting MD at bedside.

## 2015-01-29 NOTE — ED Provider Notes (Signed)
TIME SEEN: 10:19 AM   CHIEF COMPLAINT: dizziness, diaphoresis   HPI:  Eric [name] is a 69 y.o. male, with a PMhx of HTN, CVA, and tobacco use, brought in by ambulance, who presents to the Emergency Department complaining of sudden onset palpitations, light-headed dizziness, and diaphoresis. Symptoms have since resolved and pt states he feels 'good' currently. He notes he was sweeping the house this morning when the symptoms first began then later while sitting at church he became dizzy, diaphoretic, and felt his heart fluttering.  Family reports he ate breakfast this morning. His wife states he has been acting normal with the exception of last night when he was getting up excessively to urinate . Pt denies any history of ACS, PE or DVT, CHF. He has not received a stress test or cardiac catheterization. He denies pain or discomfort in his chest, SOB, nausea, vomiting, diarrhea, dizziness currently, numbness, tingling or focal weakness. Pt is on 81mg  aspirin daily. History is limited as patient is a poor historian.   PCP is with the [facility] in [place]: See HPI Constitutional: no fever  Eyes: no drainage  ENT: no runny nose   Cardiovascular:  no chest pain  Resp: no SOB  GI: no vomiting GU: no dysuria Integumentary: no rash  Allergy: no hives  Musculoskeletal: no leg swelling  Neurological: no slurred speech ROS otherwise negative  PAST MEDICAL HISTORY/PAST SURGICAL HISTORY:  Past Medical History  Diagnosis Date  . Stroke   . Arthritis   . HTN (hypertension)   . Constipation     MEDICATIONS:  Prior to Admission medications   Medication Sig Start Date End Date Taking? Authorizing Provider  amLODipine (NORVASC) 5 MG tablet Take 5 mg by mouth daily.     Historical Provider, MD  aspirin EC 81 MG tablet Take 81 mg by mouth daily.    Historical Provider, MD  gabapentin (NEURONTIN) 300 MG capsule Take 300 mg by mouth 2 (two) times daily.    Historical Provider, MD   hydrochlorothiazide (HYDRODIURIL) 25 MG tablet Take 25 mg by mouth daily.    Historical Provider, MD  HYDROcodone-acetaminophen (NORCO/VICODIN) 5-325 MG per tablet Take 1 tablet by mouth every 6 (six) hours as needed. 09/30/14   Althea Backs N Kimberly Coye, DO  meloxicam (MOBIC) 7.5 MG tablet Take 1 tablet (7.5 mg total) by mouth daily. 08/10/13   Hope Bunnie Pion, NP  naproxen (NAPROSYN) 500 MG tablet Take 500 mg by mouth 2 (two) times daily with a meal.    Historical Provider, MD  omeprazole (PRILOSEC) 20 MG capsule Take 20 mg by mouth daily.    Historical Provider, MD  polyethylene glycol (MIRALAX / GLYCOLAX) packet Take 17 g by mouth daily. 10/13/12   Ripley Fraise, MD    ALLERGIES:  No Known Allergies  SOCIAL HISTORY:  History  Substance Use Topics  . Smoking status: Current Some Day Smoker    Types: Cigarettes  . Smokeless tobacco: Not on file  . Alcohol Use: No    FAMILY HISTORY: Family History  Problem Relation Age of Onset  . Colon polyps Neg Hx     EXAM: BP 137/97 mmHg  Pulse 123  Resp 39  SpO2 96% CONSTITUTIONAL: Alert and oriented and responds appropriately to questions. Elderly, chronically ill-appearing, poor historian HEAD: Normocephalic EYES: Conjunctivae clear, PERRL ENT: normal nose; no rhinorrhea; moist mucous membranes; pharynx without lesions noted NECK: Supple, no meningismus, no LAD  CARD:  regular tachycardic; S1 and  S2 appreciated; no murmurs, no clicks, no rubs, no gallops RESP: Normal chest excursion without splinting, patient is tachypneic; breath sounds clear and equal bilaterally; no wheezes, no rhonchi, no rales, no hypoxia or respiratory distress, speaking full sentences ABD/GI: Normal bowel sounds; non-distended; soft, non-tender, no rebound, no guarding, no peritoneal signs BACK:  The back appears normal and is non-tender to palpation, there is no CVA tenderness EXT: Normal ROM in all joints; non-tender to palpation; no edema; normal capillary refill; no  cyanosis, no calf tenderness or swelling    SKIN: Normal color for age and race; warm; diaphoretic, no rash NEURO: Moves all extremities equally, sensation to light touch intact diffusely, cranial nerves II through XII intact PSYCH: The patient's mood and manner are appropriate. Grooming and personal hygiene are appropriate.  MEDICAL DECISION MAKING: Patient here with an episode of lightheadedness, diaphoresis that occurred while exerting himself and again while at rest today. Blood glucose in the ED is normal. Denies any chest pain, shortness of breath with these episodes. His EKG shows concerns for ST elevation in the inferior leads. He has a bifascicular block which is old compared to EKG in 2010. He is still diaphoretic in the ED. Code STEMI initiated given concerning EKG.  ED PROGRESS: Discussed with Dr. Pernell Dupre with cardiology. We have reviewed patient's EKG and symptoms.  Patient's first troponin is negative and he was found to be febrile. I feel this is less likely a STEMI and more likely sepsis. Dr. Tamala Julian agrees and we will cancel the code STEMI. He has received aspirin. His EKG changes are likely rate related due to his tachycardia which is likely secondary to his fever. Will give broad-spectrum antibiotics, IV fluids, obtain labs, chest x-ray, culture.   Patient's labs show mild leukopenia. He also has mild acute renal failure. Lactate is also mildly elevated. Urine shows hematuria and bacteria but this was a catheterized specimen. Chest x-ray is clear. As his heart rate has improved so has his EKG. His EKG now looks unchanged compared to his EKG in 2010. He is still denying any chest pain or shortness of breath. Given he meets her prescription., Will admit for further monitoring. Discussed with Dr. Caryn Section with hospitalist service who agrees.   EKG Interpretation  Date/Time:  Sunday January 29 2015 10:01:01 EDT Ventricular Rate:  124 PR Interval:    QRS Duration: 82 QT  Interval:  410 QTC Calculation: 589 R Axis:   -94 Text Interpretation:  Sinus tachycardia Left anterior fascicular block Right bundle branch block Prolonged QT interval Confirmed by Molly Maselli,  DO, Citlalli Weikel [id number]) on 01/29/2015 10:07:14 AM          EKG Interpretation  Date/Time:  Sunday January 29 2015 11:52:58 EDT Ventricular Rate:  104 PR Interval:  183 QRS Duration: 81 QT Interval:  375 QTC Calculation: 493 R Axis:   -96 Text Interpretation:  Sinus tachycardia Ventricular premature complex Left anterior fascicular block Low voltage, precordial leads Probable right ventricular hypertrophy Nonspecific T abnormalities, anterior leads No significant change since 2010 Borderline prolonged QT interval Confirmed by Sharalee Witman,  DO, Everett Ricciardelli ([id number]) on 01/29/2015 12:04:14 PM      CRITICAL CARE Performed by: Cyril Mourning Clorene Nerio, DO   Total critical care time: 45 minutes  Critical care time was exclusive of separately billable procedures and treating other patients.  Critical care was necessary to treat or prevent imminent or life-threatening deterioration.  Critical care was time spent personally by me on the following activities: development of treatment plan  with patient and/or surrogate as well as nursing, discussions with consultants, evaluation of patient's response to treatment, examination of patient, obtaining history from patient or surrogate, ordering and performing treatments and interventions, ordering and review of laboratory studies, ordering and review of radiographic studies, pulse oximetry and re-evaluation of patient's condition.   I personally performed the services described in this documentation, which was scribed in my presence. The recorded information has been reviewed and is accurate.    [place], DO 01/29/15 [phone]

## 2015-01-29 NOTE — Progress Notes (Signed)
[place] for Vancomycin & Zosyn Indication: rule out sepsis  No Known Allergies  Patient Measurements:   Wt: 89 kg  Vital Signs: Temp: 102.6 F (39.2 C) (06/26 1015) Temp Source: Rectal (06/26 1015) BP: 107/65 mmHg (06/26 1215) Pulse Rate: 98 (06/26 1245) Intake/Output from previous day:   Intake/Output from this shift:    Labs:  Recent Labs  01/29/15 1013 01/29/15 1029  WBC  --  3.5*  HGB 16.0 13.8  PLT  --  117*  CREATININE 1.70* 1.52*   CrCl cannot be calculated (Unknown ideal weight.). No results for input(s): VANCOTROUGH, VANCOPEAK, VANCORANDOM, GENTTROUGH, GENTPEAK, GENTRANDOM, TOBRATROUGH, TOBRAPEAK, TOBRARND, AMIKACINPEAK, AMIKACINTROU, AMIKACIN in the last 72 hours.   Microbiology: No results found for this or any previous visit (from the past 720 hour(s)).  Anti-infectives    Start     Dose/Rate Route Frequency Ordered Stop   01/29/15 1030  vancomycin (VANCOCIN) IVPB 1000 mg/200 mL premix     1,000 mg 200 mL/hr over 60 Minutes Intravenous  Once 01/29/15 1018 01/29/15 1219   01/29/15 1030  piperacillin-tazobactam (ZOSYN) IVPB 3.375 g     3.375 g 12.5 mL/hr over 240 Minutes Intravenous  Once 01/29/15 1018 01/29/15 1116      Assessment: 69 yo M admitted with generalized weakness, fever (Tm 102.6), and urinary frequency.   Lactic acid level is elevated.   Cx data pending.  Chronic renal insufficiency noted.  Estimated CrCl ~ 50 ml/min.  Goal of Therapy:  Vancomycin trough level 15-20 mcg/ml  Plan:  Zosyn 3.375gm IV Q8h to be infused over 4hrs Vancomycin 750mg  IV q12h Check Vancomycin trough at steady state Monitor renal function and cx data   Biagio Borg 01/29/2015,1:43 PM

## 2015-01-29 NOTE — ED Notes (Signed)
CRITICAL VALUE ALERT  Critical value received:  Lactic acid 2.26  Date of notification:  01/29/2015  Time of notification:  9672  Critical value read back:Yes.    Nurse who received alert:  [facility]  MD notified (1st page):  Dr. Leonides Schanz  Time of first page: [phone]  MD notified (2nd page):  Time of second page:  Responding MD:  Dr. Leonides Schanz  Time MD responded:  [phone]

## 2015-01-29 NOTE — H&P (Signed)
[facility] History and Physical  PATTON RABINOVICH EPP:[id number] DOB: July 05, 1946 DOA: 01/29/2015  Referring physician: ED physician, Dr. Leonides Schanz PCP: Beacher May, MD  ([place])  Chief Complaint: Generalized weakness, lightheadedness, and diaphoresis; frequent nocturia.  HPI: NAZIER NEYHART is a 69 y.o. male with a history of a previous stroke with residual right upper extremity weakness; degenerative joint disease with neuropathy; and the central hypertension, who presents with a complaint of generalized weakness, lightheadedness, and several episodes of diaphoresis. The patient got up in his usual state of health this morning. However, he reports that he had got no several times during the night to urinate which was unusual for him. He proceeded to get ready to go to church. While at church, he had a bout of lightheadedness, generalized weakness, and sweatiness. He also felt his heart fluttering. There was no loss of consciousness. The patient did not fall. He denies chest pain, shortness of breath, nausea, vomiting, diarrhea, swelling in his legs, or pain with urination. He does endorse constipation chronically. He denies any recent medication changes. He denies any upper respiratory infection symptoms, subjective fever, or chills.  On arrival to the ED, he was febrile with temperature 102.6 and tachycardic with a heart rate 126. His blood pressure was within normal limits. His EKG revealed sinus tachycardia with a heart rate of 124 bpm and multiple EKG changes including left anterior fascicular block, right bundle branch block, and possible ST elevations in the lateral leads. His chest x-ray revealed no acute findings. His lab data were significant for a BUN of 21, creatinine of 1.70, glucose 129, AST of 59, lactic acid of 2.26, W BC of 3.5. His urinalysis revealed 7-10 RBCs and a few bacteria. He is being admitted for further evaluation and  management.     Review of Systems:  Positive as above in history present illness. In addition, he has chronic pain in his legs from DJD and neuropathy; he has chronic weakness in his right upper arm due to his previous stroke; he sometimes has difficulty with his balance. Otherwise review of systems is negative.  Past Medical History  Diagnosis Date  . Stroke   . Arthritis   . HTN (hypertension)   . Constipation   . Cerebral artery occlusion with cerebral infarction 07/15/2011  . Anemia 06/04/2010    Qualifier: Diagnosis of  By: Oneida Alar MD, Sandi L   . Stiffness of joints, not elsewhere classified, multiple sites 11/04/2013   History reviewed. No pertinent past surgical history. Social History: Patient is married. He has 2 children. 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 He no longer drives. He ambulates with a cane. He smokes 1-2 cigarettes weekly. He denies alcohol and illicit drug use.   No Known Allergies  Family History  Problem Relation Age of Onset  . Colon polyps Neg Hx   His parents are deceased. His mother had a history of stroke. Etiology of his father's health is unknown.  Prior to Admission medications   Medication Sig Start Date End Date Taking? Authorizing Provider  amLODipine (NORVASC) 5 MG tablet Take 5 mg by mouth daily.    Yes Historical Provider, MD  aspirin EC 81 MG tablet Take 81 mg by mouth daily.   Yes Historical Provider, MD  gabapentin (NEURONTIN) 300 MG capsule Take 300 mg by mouth 2 (two) times daily.   Yes Historical Provider, MD  hydrochlorothiazide (HYDRODIURIL) 25 MG tablet Take 25 mg by mouth daily.  Yes Historical Provider, MD  naproxen (NAPROSYN) 500 MG tablet Take 500 mg by mouth 2 (two) times daily with a meal.   Yes Historical Provider, MD  omeprazole (PRILOSEC) 20 MG capsule Take 20 mg by mouth daily.   Yes Historical Provider, MD  HYDROcodone-acetaminophen (NORCO/VICODIN) 5-325 MG per tablet Take 1 tablet by mouth every 6 (six) hours as  needed. Patient not taking: Reported on 01/29/2015 09/30/14   Delice Bison Ward, DO  meloxicam (MOBIC) 7.5 MG tablet Take 1 tablet (7.5 mg total) by mouth daily. Patient not taking: Reported on 01/29/2015 08/10/13   Ashley Murrain, NP  polyethylene glycol (MIRALAX / GLYCOLAX) packet Take 17 g by mouth daily. Patient not taking: Reported on 01/29/2015 10/13/12   Ripley Fraise, MD   Physical Exam: Filed Vitals:   01/29/15 1145 01/29/15 1200 01/29/15 1215 01/29/15 1245  BP: 114/74 124/86 107/65   Pulse: 106 104 100 98  Temp:      TempSrc:      Resp: 29 40 31 30  SpO2: 97% 99% 97% 99%    Wt Readings from Last 3 Encounters:  09/30/14 91.627 kg (202 lb)  08/10/13 90.266 kg (199 lb)  06/12/13 95.255 kg (210 lb)    General:  Appears calm and comfortable; 69 year old African-American man in no acute distress. Eyes: PERRL, normal lids, irises & conjunctiva; conjunctivae are clear and sclerae are white. Extremities limbs are intact. ENT: Decreased hearing acuity; oropharynx with mildly dry mucous membranes; multiple missing teeth Neck: no LAD, masses or thyromegaly; no obvious bruit Cardiovascular: S1, S2, with borderline tachycardia. Pedal pulses palpable. No pedal edema. Telemetry: Normal sinus rhythm to sinus tachycardia  Respiratory: CTA bilaterally, no w/r/r. Normal respiratory effort. Abdomen: soft, ntnd; positive bowel sounds no masses palpated. Skin: no rash or induration seen on limited exam Musculoskeletal: He has severe arthritic hypertrophic changes in his hand PIP, MCP, and DIP joints on his right greater than his left hand; no acute hot red joints. Range of motion of all of his joints are within normal limits exception of decreased range of motion of his right hand which is chronic. Psychiatric: grossly normal mood and affect, speech fluent and appropriate Neurologic: Cranial nerves II through XII are grossly intact.           Labs on Admission:  Basic Metabolic Panel:  Recent  Labs Lab 01/29/15 1013 01/29/15 1029  NA 137 135  K 3.7 3.6  CL 102 100*  CO2  --  25  GLUCOSE 131* 129*  BUN 21* 22*  CREATININE 1.70* 1.52*  CALCIUM  --  9.1   Liver Function Tests:  Recent Labs Lab 01/29/15 1029  AST 59*  ALT 42  ALKPHOS 85  BILITOT 1.1  PROT 8.3*  ALBUMIN 3.7   No results for input(s): LIPASE, AMYLASE in the last 168 hours. No results for input(s): AMMONIA in the last 168 hours. CBC:  Recent Labs Lab 01/29/15 1013 01/29/15 1029  WBC  --  3.5*  NEUTROABS  --  2.9  HGB 16.0 13.8  HCT 47.0 41.6  MCV  --  93.5  PLT  --  117*   Cardiac Enzymes: No results for input(s): CKTOTAL, CKMB, CKMBINDEX, TROPONINI in the last 168 hours.  BNP (last 3 results) No results for input(s): BNP in the last 8760 hours.  ProBNP (last 3 results) No results for input(s): PROBNP in the last 8760 hours.  CBG:  Recent Labs Lab 01/29/15 1004  GLUCAP 131*  Radiological Exams on Admission: Dg Chest Portable 1 View  01/29/2015   CLINICAL DATA:  Fever.  EXAM: PORTABLE CHEST - 1 VIEW  COMPARISON:  01/04/2010  FINDINGS: Cardiomediastinal silhouette is within normal limits. Lungs are well inflated with minimal opacities in the lung bases most suggestive of scarring or atelectasis. No segmental airspace consolidation, edema, pleural effusion, or pneumothorax is identified. No acute osseous abnormality is seen.  IMPRESSION: No evidence of acute airspace disease.   Electronically Signed   By: Logan Bores   On: 01/29/2015 10:41    EKG: Independently reviewed.   Assessment/Plan Principal Problem:   SIRS (systemic inflammatory response syndrome) Active Problems:   Abnormal EKG   Generalized weakness   Diaphoresis   Essential hypertension   Acute kidney injury   Hyperglycemia   1. SIRS. The patient presents tachycardia and fever. His blood pressure is within normal limits on admission. His lactic acid level is modestly elevated. There appears to be no obvious  sign of infection with exception of mild microhematuria and mild bacteriuria. He endorses polyuria/nocturia, which is unusual for him. He does not endorse diarrhea or any upper respiratory or lower respiratory infection type symptoms. We will treat for presumed urinary tract infection. Blood cultures and a urine culture were ordered in the ED. He was given vancomycin and Zosyn. He was bolused with 2 L of IV fluids. We'll continue vancomycin, Zosyn, and IV fluid hydration. 2. Abnormal EKG. Per chart review, the patient has a history of right bundle branch block and left fascicular block. On arrival to the ED, his initial EKG revealed same, but a suspicion of ST elevations in the lateral leads. A code STEMI was called. ED physician, Dr. Leonides Schanz discussed the patient's case with cardiologist on call at [facility], Dr. Tamala Julian. Dr. Tamala Julian reviewed the EKG and discontinued the code STEMI as he felt that the elevations were rate related. Patient's troponin I 1 in the ED is within normal limits. The patient does not endorse chest pain. Will continue to cycle history: I and order a 2-D echocardiogram for evaluation. Will continue aspirin. 3. Essential hypertension. The patient is treated chronically with hydrochlorothiazide and amlodipine. His blood pressure is on the low-normal side. Will hold hydrochlorothiazide and hydrate. 4. Acute kidney injury. The patient's BUN was 21 and his creatinine was 1.70 on admission. Per chart review, his creatinine ranged from normal to 1.4 and 2011. The increase in his creatinine is likely from prerenal azotemia and/or hypovolemia. As above, will hold HCTZ and will hydrate. Will monitor his renal function closely. 5. Mild hyperglycemia. The patient has no history of diabetes. Will order hemoglobin A1c and continue to monitor. 6. DJD with neuropathy. Will hold meloxicam and treat his pain with as needed hydrocodone. We'll continue gabapentin. 7. Generalized weakness, lightheadedness, and  diaphoresis, likely secondary to #1. Will treat as above.    Code Status: Full code DVT Prophylaxis: Lovenox Family Communication: Discussed with wife and niece Disposition Plan: Discharge when clinically appropriate  Time spent: One hour  [facility] Pager [phone]

## 2015-01-29 NOTE — Progress Notes (Signed)
Utilization review Completed Jasmina Gendron RN BSN   

## 2015-01-29 NOTE — Progress Notes (Signed)
Echocardiogram 2D Echocardiogram has been performed.  Eric [name] 01/29/2015, 2:05 PM

## 2015-01-29 NOTE — ED Notes (Signed)
Pt reports was at church this morning and became diaphoretic and felt heart fluttering.  Pt denies any chest pain, denies weakness or sob.

## 2015-01-29 NOTE — ED Notes (Signed)
Critical Lactic acid result given to Dr Leonides Schanz.

## 2015-01-30 ENCOUNTER — Encounter (HOSPITAL_COMMUNITY): Payer: Self-pay | Admitting: Internal Medicine

## 2015-01-30 DIAGNOSIS — D72819 Decreased white blood cell count, unspecified: Secondary | ICD-10-CM

## 2015-01-30 DIAGNOSIS — D696 Thrombocytopenia, unspecified: Secondary | ICD-10-CM

## 2015-01-30 DIAGNOSIS — I5189 Other ill-defined heart diseases: Secondary | ICD-10-CM

## 2015-01-30 HISTORY — DX: Thrombocytopenia, unspecified: D69.6

## 2015-01-30 HISTORY — DX: Other ill-defined heart diseases: I51.89

## 2015-01-30 HISTORY — DX: Decreased white blood cell count, unspecified: D72.819

## 2015-01-30 LAB — COMPREHENSIVE METABOLIC PANEL
ALT: 52 U/L (ref 17–63)
AST: 79 U/L — AB (ref 15–41)
Albumin: 2.9 g/dL — ABNORMAL LOW (ref 3.5–5.0)
Alkaline Phosphatase: 93 U/L (ref 38–126)
Anion gap: 6 (ref 5–15)
BILIRUBIN TOTAL: 1.5 mg/dL — AB (ref 0.3–1.2)
BUN: 17 mg/dL (ref 6–20)
CHLORIDE: 102 mmol/L (ref 101–111)
CO2: 28 mmol/L (ref 22–32)
CREATININE: 1.21 mg/dL (ref 0.61–1.24)
Calcium: 8.3 mg/dL — ABNORMAL LOW (ref 8.9–10.3)
GFR calc non Af Amer: 60 mL/min — ABNORMAL LOW (ref >60)
Glucose, Bld: 90 mg/dL (ref 65–99)
Potassium: 3.6 mmol/L (ref 3.5–5.1)
Sodium: 136 mmol/L (ref 135–145)
Total Protein: 7 g/dL (ref 6.5–8.1)

## 2015-01-30 LAB — CBC
HCT: 41.9 % (ref 39.0–52.0)
HEMOGLOBIN: 13.3 g/dL (ref 13.0–17.0)
MCH: 30 pg (ref 26.0–34.0)
MCHC: 31.7 g/dL (ref 30.0–36.0)
MCV: 94.6 fL (ref 78.0–100.0)
Platelets: 86 10*3/uL — ABNORMAL LOW (ref 150–400)
RBC: 4.43 MIL/uL (ref 4.22–5.81)
RDW: 14.6 % (ref 11.5–15.5)
WBC: 2.3 10*3/uL — ABNORMAL LOW (ref 4.0–10.5)

## 2015-01-30 LAB — FERRITIN: Ferritin: 767 ng/mL — ABNORMAL HIGH (ref 24–336)

## 2015-01-30 LAB — TROPONIN I: Troponin I: 0.03 ng/mL

## 2015-01-30 LAB — HEMOGLOBIN A1C
Hgb A1c MFr Bld: 5.8 % — ABNORMAL HIGH (ref 4.8–5.6)
MEAN PLASMA GLUCOSE: 120 mg/dL

## 2015-01-30 LAB — VITAMIN B12: Vitamin B-12: 372 pg/mL (ref 180–914)

## 2015-01-30 LAB — IRON AND TIBC
Iron: 25 ug/dL — ABNORMAL LOW (ref 45–182)
SATURATION RATIOS: 10 % — AB (ref 17.9–39.5)
TIBC: 262 ug/dL (ref 250–450)
UIBC: 237 ug/dL

## 2015-01-30 LAB — FOLATE: Folate: 12.5 ng/mL (ref >5.9)

## 2015-01-30 NOTE — Evaluation (Signed)
Physical Therapy Evaluation Patient Details Name: Eric [name] MRN: [id number] DOB: 1946-03-26 Today's Date: 01/30/2015   History of Present Illness  Pt is admitted with SIRS secondary to a UTI.  He has right hemiparesis due to a stroke in 2013.  He is married and normally independent at home with ADLs using a cane.  Clinical Impression   Pt was seen for evaluation.  He was alert and oriented, very cooperative and reports feeling better.  He is found to be generally deconditioned from his baseline with mild gait instability.  I am recommending HHPT at d/c and pt is in agreement.  He has all needed DME.    Follow Up Recommendations Home health PT    Equipment Recommendations  None recommended by PT    Recommendations for Other Services   none    Precautions / Restrictions Precautions Precautions: Fall Restrictions Weight Bearing Restrictions: No      Mobility  Bed Mobility Overal bed mobility: Needs Assistance Bed Mobility: Supine to Sit     Supine to sit: Supervision        Transfers Overall transfer level: Needs assistance Equipment used: Rolling walker (2 wheeled) Transfers: Sit to/from Stand Sit to Stand: Supervision         General transfer comment: pt did need mod/max assist to stand from a low sitting BSC but from a standard height he was able to stand with no physical assist  Ambulation/Gait Ambulation/Gait assistance: Min assist Ambulation Distance (Feet): 30 Feet (x 2 laps) Assistive device: Rolling walker (2 wheeled);Straight cane Gait Pattern/deviations: Decreased step length - right;Decreased dorsiflexion - right;Decreased weight shift to left;Trunk flexed Gait velocity: right trunk is rotated posteriorly during gait in a typical hemi gait pattern Gait velocity interpretation: Below normal speed for age/gender General Gait Details: pt had difficulty controlling the walker with his right hand...he was given a straight cane to use which he insisted  in using in the right hand...gait is mildly unstable with either assistive device.  Stairs            Wheelchair Mobility    Modified Rankin (Stroke Patients Only)       Balance Overall balance assessment: Needs assistance Sitting-balance support: No upper extremity supported;Feet supported Sitting balance-Leahy Scale: Good     Standing balance support: No upper extremity supported Standing balance-Leahy Scale: Fair                               Pertinent Vitals/Pain Pain Assessment: No/denies pain    Home Living Family/patient expects to be discharged to:: Private residence Living Arrangements: Spouse/significant other Available Help at Discharge: Family;Available 24 hours/day Type of Home: House Home Access: Stairs to enter   [employer] of Steps: 1 Home Layout: One level Home Equipment: Walker - 2 wheels;Cane - single point      Prior Function Level of Independence: Independent with assistive device(s)         Comments: uses a cane for gait     Hand Dominance        Extremity/Trunk Assessment               Lower Extremity Assessment: Generalized weakness (appears to have some increase of tone in the right trunk and LE based on gait pattern)      Cervical / Trunk Assessment: Kyphotic  Communication   Communication: Expressive difficulties  Cognition Arousal/Alertness: Awake/alert Behavior During Therapy: WFL for tasks assessed/performed Overall  Cognitive Status: Within Functional Limits for tasks assessed                      General Comments      Exercises        Assessment/Plan    PT Assessment Patient needs continued PT services  PT Diagnosis Difficulty walking;Abnormality of gait (deconditioned)   PT Problem List Decreased activity tolerance;Decreased mobility;Impaired tone  PT Treatment Interventions DME instruction;Gait training;Functional mobility training;Therapeutic exercise;Balance  training   PT Goals (Current goals can be found in the Care Plan section) Acute Rehab PT Goals Patient Stated Goal: none stated PT Goal Formulation: With patient Time For Goal Achievement: 02/13/15 Potential to Achieve Goals: Good    Frequency Min 3X/week   Barriers to discharge   none    Co-evaluation               End of Session Equipment Utilized During Treatment: Gait belt Activity Tolerance: Patient tolerated treatment well Patient left: in chair;with call bell/phone within reach;with chair alarm set Nurse Communication: Mobility status         Time: 9169-4503 PT Time Calculation (min) (ACUTE ONLY): 36 min   Charges:   PT Evaluation $Initial PT Evaluation Tier I: 1 Procedure     PT G CodesDemetrios Isaacs L  PT 01/30/2015, 1:37 PM [phone]

## 2015-01-30 NOTE — Progress Notes (Signed)
[facility] PROGRESS NOTE  Jaymin Waln Hafford ZTI:[id number] DOB: 03/24/1946 DOA: 01/29/2015 PCP: Beacher May, MD    Code Status: Full code Family Communication: Discussed with wife on 6/26. Disposition Plan: Discharge when clinically appropriate.   Consultants:  None  Procedures:  2-D echocardiogram 01/29/15:Study Conclusions- - Left ventricle: The cavity size was normal. There was focal basal hypertrophy. Systolic function was vigorous. The estimated ejection fraction was in the range of 65% to 70%. Wall motion was normal; there were no regional wall motion abnormalities. Doppler parameters are consistent with abnormal left ventricular relaxation (grade 1 diastolic dysfunction). - Aortic valve: Mildly calcified annulus. Trileaflet; moderately thickened leaflets. A large calcification is noted on the noncoronary cusp. This was also present in the June 3,2011 echo and is not consistent with vegetation. There was moderate regurgitation. The AI VC is 0.4 cm. Valve area (VTI): 3.29 cm^2. Valve area (Vmax): 2.65 cm^2. - Mitral valve: Mildly calcified annulus. Mildly thickened leaflets - Atrial septum: No defect or patent foramen ovale was identified. - Systemic veins: IVC is small suggesting low RA pressures and hypovolemia. - Technically adequate study.  Antibiotics:  Vancomycin 6/26  Zosyn 6/26  HPI/Subjective: Patient says that he is feeling better. He denies frequent urination overnight. He denies chest congestion or chest pain. He denies diaphoresis, subjective fever or chills.  Objective: Filed Vitals:   01/30/15 0457  BP: 122/68  Pulse: 90  Temp: 98.6 F (37 C)  Resp: 24   oxygen saturation 98% on room air.  Intake/Output Summary (Last 24 hours) at 01/30/15 1435 Last data filed at 01/30/15 1200  Gross per 24 hour  Intake 3080.41 ml  Output    950 ml  Net 2130.41 ml   Filed Weights   01/29/15 1413 01/30/15 0457  Weight: 89  kg (196 lb 3.4 oz) 87.4 kg (192 lb 10.9 oz)    Exam:   General:  Pleasant alert 69 year old African-American man in no acute distress.  Cardiovascular: S1, S2, with a soft systolic murmur.  Respiratory: Clear to auscultation bilaterally.  Abdomen: Positive bowel sounds, soft, nontender, nondistended.  Musculoskeletal/extremity: No pedal edema.  Neurologic: She is alert and oriented 3. Cranial nerves II through XII are intact.  Data Reviewed: Basic Metabolic Panel:  Recent Labs Lab 01/29/15 1013 01/29/15 1029 01/30/15 0603  NA 137 135 136  K 3.7 3.6 3.6  CL 102 100* 102  CO2  --  25 28  GLUCOSE 131* 129* 90  BUN 21* 22* 17  CREATININE 1.70* 1.52* 1.21  CALCIUM  --  9.1 8.3*   Liver Function Tests:  Recent Labs Lab 01/29/15 1029 01/30/15 0603  AST 59* 79*  ALT 42 52  ALKPHOS 85 93  BILITOT 1.1 1.5*  PROT 8.3* 7.0  ALBUMIN 3.7 2.9*   No results for input(s): LIPASE, AMYLASE in the last 168 hours. No results for input(s): AMMONIA in the last 168 hours. CBC:  Recent Labs Lab 01/29/15 1013 01/29/15 1029 01/30/15 0603  WBC  --  3.5* 2.3*  NEUTROABS  --  2.9  --   HGB 16.0 13.8 13.3  HCT 47.0 41.6 41.9  MCV  --  93.5 94.6  PLT  --  117* 86*   Cardiac Enzymes:  Recent Labs Lab 01/29/15 1333 01/29/15 1911 01/30/15 0125  TROPONINI 0.04* 0.03 0.03   BNP (last 3 results) No results for input(s): BNP in the last 8760 hours.  ProBNP (last 3 results) No results for input(s): PROBNP in the last 8760 hours.  CBG:  Recent Labs Lab 01/29/15 1004  GLUCAP 131*    Recent Results (from the past 240 hour(s))  Blood culture (routine x 2)     Status: None (Preliminary result)   Collection Time: 01/29/15 10:32 AM  Result Value Ref Range Status   Specimen Description RIGHT ANTECUBITAL  Final   Special Requests   Final    BOTTLES DRAWN AEROBIC AND ANAEROBIC AEB 8CC ANA 8CC   Culture NO GROWTH < 24 HOURS  Final   Report Status PENDING  Incomplete   Blood culture (routine x 2)     Status: None (Preliminary result)   Collection Time: 01/29/15 10:35 AM  Result Value Ref Range Status   Specimen Description BLOOD RIGHT HAND  Final   Special Requests   Final    BOTTLES DRAWN AEROBIC AND ANAEROBIC AEB 6CC ANA [place]   Culture NO GROWTH < 24 HOURS  Final   Report Status PENDING  Incomplete  Urine culture     Status: None (Preliminary result)   Collection Time: 01/29/15 11:08 AM  Result Value Ref Range Status   Specimen Description URINE, CATHETERIZED  Final   Special Requests NONE  Final   Culture   Final    NO GROWTH < 24 HOURS Performed at [facility]    Report Status PENDING  Incomplete     Studies: Dg Chest Portable 1 View  01/29/2015   CLINICAL DATA:  Fever.  EXAM: PORTABLE CHEST - 1 VIEW  COMPARISON:  01/04/2010  FINDINGS: Cardiomediastinal silhouette is within normal limits. Lungs are well inflated with minimal opacities in the lung bases most suggestive of scarring or atelectasis. No segmental airspace consolidation, edema, pleural effusion, or pneumothorax is identified. No acute osseous abnormality is seen.  IMPRESSION: No evidence of acute airspace disease.   Electronically Signed   By: Logan Bores   On: 01/29/2015 10:41    Scheduled Meds: . amLODipine  5 mg Oral Daily  . aspirin EC  81 mg Oral Daily  . gabapentin  300 mg Oral BID  . pantoprazole  40 mg Oral Daily  . piperacillin-tazobactam (ZOSYN)  IV  3.375 g Intravenous 3 times per day  . senna-docusate  1 tablet Oral QHS  . vancomycin  750 mg Intravenous Q12H   Continuous Infusions: . 0.9 % NaCl with KCl 20 mEq / L 50 mL/hr at 01/30/15 0848   Assessment and plan:  Principal Problem:   SIRS (systemic inflammatory response syndrome) Active Problems:   Abnormal EKG   UTI (urinary tract infection), bacterial   Generalized weakness   Diaphoresis   Diastolic dysfunction   Essential hypertension   Acute kidney injury   Hyperglycemia   Leucopenia    Thrombocytopenia   SIRS.  The patient presented tachycardia and fever. His blood pressure was within normal limits on admission. His lactic acid level was modestly elevated. There appeared to be no obvious sign of infection with exception of mild microhematuria and mild bacteriuria. He endorsed polyuria/nocturia, which was unusual for him. He denied diarrhea or any upper respiratory or lower respiratory infection type symptoms.  He was started on IV fluids, vancomycin and Zosyn for presumed urinary tract infection. Blood cultures and urine culture were ordered in the ED and are currently pending. Will continue current management.  Abnormal EKG.  Per chart review, the patient has a history of right bundle branch block and left fascicular block. On arrival to the ED, his initial EKG revealed about the same, but with  a suspicion of ST elevations in the lateral leads. A code STEMI was called in the ED.  ED physician, Dr. Leonides Schanz discussed the patient's case with cardiologist on call at [facility], Dr. Tamala Julian. Dr. Tamala Julian reviewed the EKG and discontinued the code STEMI as he felt that the elevations were rate related. Patient's troponin I was marginally elevated at 0.4. Troponin I was cycled and has normalized. 2-D echocardiogram ordered for evaluation and revealed EF of 16-57%, grade 1 diastolic dysfunction, and no left ventricular regional wall motion abnormalities. Patient has no complaints of chest pain. Will continue aspirin.  Essential hypertension.  The patient is treated chronically with hydrochlorothiazide and amlodipine. His blood pressure was on the low-normal side, so high to close eyes was withheld. Amlodipine was restarted. We'll continue gentle IV fluid hydration.  Thrombocytopenia and leukopenia. On admission, the patient's W BC was 3.5 and platelet count was 117. With vigorous IV fluid hydration overnight, his white blood cell count and platelet count have decreased. Lovenox was discontinued. For  further evaluation, number studies were ordered. -His total iron was slightly low at 25, ferritin 767, folate of 12.5, and vitamin B12 of 372. His TSH was within normal limits at 0.9. -Have ordered HIV serology and HCV serology. -Etiology is unclear as the patient does not recall having a known history of cytopenias. We'll consider infection/Sirs as a potential etiology. We'll continue to monitor.  Acute kidney injury.  The patient's BUN was 21 and his creatinine was 1.70 on admission. Per chart review, his creatinine ranged from normal to 1.4 and 2011. He was started on vigorous IV fluids for presumed prerenal azotemia and/or hypovolemia. HCTZ was withheld. His creatinine has improved and is back to baseline.  Mild hyperbilirubinemia and elevated AST. Viral hepatitis panel ordered for evaluation.  Mild hyperglycemia.  The patient has no history of diabetes, but was modestly hyperglycemic on admission. His hemoglobin A1c was 5.8. The elevation was likely secondary to infection/stress induced.  DJD with neuropathy.  Will hold meloxicam and treat his pain with as needed hydrocodone. We'll continue gabapentin.    Generalized weakness, lightheadedness, and diaphoresis, likely secondary to #1.    With treatment, his symptoms have resolved. PT evaluation noted and appreciated. Patient has no PT needs.  Time spent: 35 minutes    [facility] Pager [phone]. If 7PM-7AM, please contact night-coverage at [email or web address], password [facility] 01/30/2015, 2:35 PM  LOS: 1 day

## 2015-01-30 NOTE — Care Management Note (Signed)
Case Management Note  Patient Details  Name: Eric [name] MRN: [id number] Date of Birth: June 01, 1946  Expected Discharge Date:                  Expected Discharge Plan:  Home/Self Care  In-House Referral:  NA  Discharge planning Services  CM Consult  Post Acute Care Choice:  NA Choice offered to:  NA  DME Arranged:    DME Agency:     HH Arranged:    HH Agency:     Status of Service:  In process, will continue to follow  Medicare Important Message Given:    Date Medicare IM Given:    Medicare IM give by:    Date Additional Medicare IM Given:    Additional Medicare Important Message give by:     If discussed at [place] of Stay Meetings, dates discussed:    Additional Comments: Pt is from home with his wife. Pt has had a CVA in the past and has some deficits but says he is mostly independent at home and can still mow his yard. Pt uses a cane/walker when needed. Pt has no HH services prior to admission. Pt plans to discharge home with self care. Pt see's Dr[facility] K[facility]en Nimrod at the [facility]. Pt receives his medications through the [place]. [facility] has been informed of pt's admission and faxed the refusal to transfer form. CM will fax DC summary to [place] at discharge. Will cont to follow for CM needs.  Sherald Barge, RN 01/30/2015, 11:58 AM

## 2015-01-31 DIAGNOSIS — I519 Heart disease, unspecified: Secondary | ICD-10-CM

## 2015-01-31 LAB — CBC WITH DIFFERENTIAL/PLATELET
BASOS ABS: 0.2 10*3/uL — AB (ref 0.0–0.1)
Basophils Relative: 5 % — ABNORMAL HIGH (ref 0–1)
EOS ABS: 0 10*3/uL (ref 0.0–0.7)
EOS PCT: 0 % (ref 0–5)
HEMATOCRIT: 40.8 % (ref 39.0–52.0)
HEMOGLOBIN: 13.1 g/dL (ref 13.0–17.0)
LYMPHS ABS: 1.9 10*3/uL (ref 0.7–4.0)
LYMPHS PCT: 50 % — AB (ref 12–46)
MCH: 30 pg (ref 26.0–34.0)
MCHC: 32.1 g/dL (ref 30.0–36.0)
MCV: 93.6 fL (ref 78.0–100.0)
MONO ABS: 0.8 10*3/uL (ref 0.1–1.0)
Monocytes Relative: 21 % — ABNORMAL HIGH (ref 3–12)
Neutro Abs: 0.9 10*3/uL — ABNORMAL LOW (ref 1.7–7.7)
Neutrophils Relative %: 25 % — ABNORMAL LOW (ref 43–77)
Platelets: 86 10*3/uL — ABNORMAL LOW (ref 150–400)
RBC: 4.36 MIL/uL (ref 4.22–5.81)
RDW: 14.4 % (ref 11.5–15.5)
Smear Review: DECREASED
WBC: 3.7 10*3/uL — ABNORMAL LOW (ref 4.0–10.5)

## 2015-01-31 LAB — URINE CULTURE: CULTURE: NO GROWTH

## 2015-01-31 LAB — HIV ANTIBODY (ROUTINE TESTING W REFLEX): HIV Screen 4th Generation wRfx: NONREACTIVE

## 2015-01-31 LAB — BASIC METABOLIC PANEL
ANION GAP: 8 (ref 5–15)
BUN: 16 mg/dL (ref 6–20)
CO2: 26 mmol/L (ref 22–32)
Calcium: 8.4 mg/dL — ABNORMAL LOW (ref 8.9–10.3)
Chloride: 102 mmol/L (ref 101–111)
Creatinine, Ser: 1.19 mg/dL (ref 0.61–1.24)
GFR calc non Af Amer: >60 mL/min (ref >60)
Glucose, Bld: 103 mg/dL — ABNORMAL HIGH (ref 65–99)
Potassium: 3.8 mmol/L (ref 3.5–5.1)
Sodium: 136 mmol/L (ref 135–145)

## 2015-01-31 MED ORDER — AMOXICILLIN-POT CLAVULANATE 500-125 MG PO TABS: 1.0000 | ORAL_TABLET | Freq: Two times a day (BID) | ORAL | Status: DC

## 2015-01-31 NOTE — Care Management Note (Signed)
Case Management Note  Patient Details  Name: MATTTHEW ZIOMEK MRN: [id number] Date of Birth: [birth year]  Expected Discharge Date:                  Expected Discharge Plan:  [place]  In-House Referral:  NA  Discharge planning Services  CM Consult  Post Acute Care Choice:  Home Health Choice offered to:  Patient  DME Arranged:    DME Agency:     HH Arranged:  PT [place]:  [place]  Status of Service:  Completed, signed off  Medicare Important Message Given:    Date Medicare IM Given:    Medicare IM give by:    Date Additional Medicare IM Given:    Additional Medicare Important Message give by:     If discussed at [place] of Stay Meetings, dates discussed:    Additional Comments: Patient discharging home today with [place] PT. Discharge summary will be faxed to [facility] when available. No further CM needs.  Sherald Barge, RN 01/31/2015, 12:08 PM

## 2015-01-31 NOTE — Care Management (Signed)
Important Message  Patient Details  Name: Eric [name] MRN: [id number] Date of Birth: Jul 27, 1946   Medicare Important Message Given:  N/A - LOS <3 / Initial given by admissions    Sherald Barge, RN 01/31/2015, 12:09 PM

## 2015-01-31 NOTE — Care Management Note (Signed)
Case Management Note  Patient Details  Name: Eric [name] MRN: [id number] Date of Birth: 1946/01/09   Expected Discharge Date:                  Expected Discharge Plan:  [place]  In-House Referral:  NA  Discharge planning Services  CM Consult  Post Acute Care Choice:  Home Health Choice offered to:  Patient  DME Arranged:    DME Agency:     HH Arranged:  PT [place]:  [place]  Status of Service:  In process, will continue to follow  Medicare Important Message Given:    Date Medicare IM Given:    Medicare IM give by:    Date Additional Medicare IM Given:    Additional Medicare Important Message give by:     If discussed at [place] of Stay Meetings, dates discussed:    Additional Comments: PT has recommended HH PT. Patient and wife have chosen [facility]. Romualdo Bolk of [facility] has been notified of referral and will obtain pt info from chart. Informed HH has 48 hours to make their first visit. No further CM needs anticipated.  Sherald Barge, RN 01/31/2015, 11:43 AM

## 2015-01-31 NOTE — Progress Notes (Signed)
Pt discharged home today per Dr. Caryn Section.  Pt's IV site D/c'd and WDL.  Pt's VSS.  Pt and patient's wife have been provided with home medication list, discharge instructions and prescriptions.  Verbalized understanding.  Pt to leave floor via WC in stable condition.

## 2015-01-31 NOTE — Discharge Summary (Addendum)
Physician Discharge Summary  BIRT REINOSO OMB:[id number] DOB: 03/20/1946 DOA: 01/29/2015  PCP: Beacher May, MD  Admit date: 01/29/2015 Discharge date: 01/31/2015  Time spent: Greater than 30 minutes  Recommendations for Outpatient Follow-up:  1. Recommend follow-up of the patient's platelet count and WBC. 2. Hepatitis panel pending at discharge.  Discharge Diagnoses:    1.Sepsis on admission secondary to UTI.   2. Abnormal EKG   3.UTI (urinary tract infection), bacterial   4.Leucopenia   5.Thrombocytopenia   6.Diastolic dysfunction   7.Essential hypertension   8.Acute kidney injury   9.Hyperglycemia   10.Generalized weakness   Discharge Condition: Improved  Diet recommendation: Heart healthy  Filed Weights   01/29/15 1413 01/30/15 0457 01/31/15 0633  Weight: 89 kg (196 lb 3.4 oz) 87.4 kg (192 lb 10.9 oz) 86.8 kg (191 lb 5.8 oz)    History of present illness:   Eric [name] is a 69 y.o. male with a history of a previous stroke with residual right upper extremity weakness; degenerative joint disease with neuropathy; and the central hypertension, who presented with a complaint of generalized weakness, lightheadedness, and several episodes of diaphoresis. He had gotten several times during the night to urinate which was unusual for him. He proceeded to get ready to go to church. While at church, he had a bout of lightheadedness, generalized weakness, and sweatiness. He also felt his heart fluttering. There was no loss of consciousness. The patient did not fall. He denied chest pain, shortness of breath, nausea, vomiting, diarrhea, swelling in his legs, or pain with urination. He did endorse constipation chronically. He denied any recent medication changes. He denied any upper respiratory infection symptoms, subjective fever, or chills.  On arrival to the ED, he was febrile with temperature 102.6 and tachycardic with a heart rate 126. His blood pressure was within normal  limits. His EKG revealed sinus tachycardia with a heart rate of 124 bpm and multiple EKG changes including left anterior fascicular block, right bundle branch block, and possible ST elevations in the lateral leads. His chest x-ray revealed no acute findings. His lab data were significant for a BUN of 21, creatinine of 1.70, glucose 129, AST of 59, lactic acid of 2.26, WBC of 3.5. His urinalysis revealed 7-10 RBCs and a few bacteria. He was admitted for further evaluation and management.   Hospital Course:   Sepsis secondary to UTI.  The patient presented with tachycardia and fever. His blood pressure was within normal limits on admission. His lactic acid level was modestly elevated. There appeared to be no obvious sign of infection with exception of a possible UTI as manifested by mild microhematuria and mild bacteriuria. He endorsed polyuria/nocturia, which was unusual for him. He denied diarrhea or any upper respiratory or lower respiratory infection type symptoms. He was started on IV fluids, vancomycin and Zosyn for presumed urinary tract infection. Blood cultures and urine culture were ordered in the ED and were negative to date. The patient improved. His fever resolved. He was discharged on Augmentin for several more days.  Abnormal EKG.  Per chart review, the patient has a history of right bundle branch block and left fascicular block. On arrival to the ED, his initial EKG revealed about the same, but with a suspicion of ST elevations in the lateral leads. A code STEMI was called in the ED. ED physician, Dr. Leonides Schanz discussed the patient's case with cardiologist on call at [facility], Dr. Tamala Julian. Dr. Tamala Julian reviewed the EKG and discontinued the code STEMI  as he felt that the elevations were rate related. Patient's troponin I was marginally elevated at 0.4. Troponin I was cycled and did normalize. 2-D echocardiogram ordered for evaluation and revealed EF of 56-81%, grade 1 diastolic dysfunction, and no left  ventricular regional wall motion abnormalities. Patient had no complaints of chest pain. He was continued on aspirin.  Essential hypertension.  The patient is treated chronically with hydrochlorothiazide and amlodipine. His blood pressure was on the low-normal side, so HCTZ was withheld. Amlodipine was restarted. He was hydrated with IV fluids.  Thrombocytopenia and leukopenia. On admission, the patient's W BC was 3.5 and platelet count was 117. With vigorous IV fluid hydration his white blood cell count and platelet count  decreased. Lovenox was discontinued. For further evaluation, number studies were ordered. -His total iron was slightly low at 25, ferritin 767, folate of 12.5, and vitamin B12 of 372. His TSH was within normal limits at 0.9. - HIV serology was negative. HCV serology was pending at discharge. -Etiology was unclear as the patient does not recall having a known history of cytopenias. Infection/SIRS could have been the potential etiology. Recommend further outpatient management.  Acute kidney injury.  The patient's BUN was 21 and his creatinine was 1.70 on admission. Per chart review, his creatinine ranged from normal to 1.4 in 2011. He was started on vigorous IV fluids for presumed prerenal azotemia and/or hypovolemia. HCTZ was withheld. His creatinine improved and was back to baseline.  Mild hyperbilirubinemia and elevated AST. Viral hepatitis panel was ordered for evaluation and results were pending at discharge.  Mild hyperglycemia.  The patient has no history of diabetes, but was modestly hyperglycemic on admission. His hemoglobin A1c was 5.8. The elevation was likely secondary to infection/stress-induced.  DJD with neuropathy.  Meloxicam was held and treated his pain with as needed hydrocodone. Gabapentin was continued.   Generalized weakness, lightheadedness, and diaphoresis, likely secondary to #1.   With treatment, his symptoms resolved. PT recommended home  health PT which was ordered.       Procedures:  Echo 01/29/2015: Study Conclusions - Left ventricle: The cavity size was normal. There was focal basal hypertrophy. Systolic function was vigorous. The estimated ejection fraction was in the range of 65% to 70%. Wall motion was normal; there were no regional wall motion abnormalities. Doppler parameters are consistent with abnormal left ventricular relaxation (grade 1 diastolic dysfunction). - Aortic valve: Mildly calcified annulus. Trileaflet; moderately thickened leaflets. A large calcification is noted on the noncoronary cusp. This was also present in the June 3,2011 echo and is not consistent with vegetation. There was moderate regurgitation. The AI VC is 0.4 cm. Valve area (VTI): 3.29 cm^2. Valve area (Vmax): 2.65 cm^2. - Mitral valve: Mildly calcified annulus. Mildly thickened leaflets - Atrial septum: No defect or patent foramen ovale was identified. - Systemic veins: IVC is small suggesting low RA pressures and hypovolemia. - Technically adequate study.  Consultations:  None  Discharge Exam: Filed Vitals:   01/31/15 0633  BP: 119/64  Pulse: 85  Temp: 98.1 F (36.7 C)  Resp: 20    General: Pleasant alert 69 year old African-American man in no acute distress.  Cardiovascular: S1, S2, with a soft systolic murmur.  Respiratory: Clear to auscultation bilaterally.  Abdomen: Positive bowel sounds, soft, nontender, nondistended.  Musculoskeletal/extremity: No pedal edema.  Neurologic: She is alert and oriented 3. Cranial nerves II through XII are intact.   Discharge Instructions   Discharge Instructions    Diet - low sodium heart  healthy    Complete by:  As directed      Discharge instructions    Complete by:  As directed   You will need to have your blood counts rechecked in 1-2 weeks by your primary care physician at the [facility].     Increase activity slowly    Complete by:  As directed            Current Discharge Medication List    START taking these medications   Details  amoxicillin-clavulanate (AUGMENTIN) 500-125 MG per tablet Take 1 tablet (500 mg total) by mouth 2 (two) times daily. Antibiotic to be taken as directed for 7 more days. Take with food. Qty: 14 tablet, Refills: 0      CONTINUE these medications which have NOT CHANGED   Details  amLODipine (NORVASC) 5 MG tablet Take 5 mg by mouth daily.     aspirin EC 81 MG tablet Take 81 mg by mouth daily.    gabapentin (NEURONTIN) 300 MG capsule Take 300 mg by mouth 2 (two) times daily.    hydrochlorothiazide (HYDRODIURIL) 25 MG tablet Take 25 mg by mouth daily.    naproxen (NAPROSYN) 500 MG tablet Take 500 mg by mouth 2 (two) times daily with a meal.    omeprazole (PRILOSEC) 20 MG capsule Take 20 mg by mouth daily.      STOP taking these medications     HYDROcodone-acetaminophen (NORCO/VICODIN) 5-325 MG per tablet      meloxicam (MOBIC) 7.5 MG tablet      polyethylene glycol (MIRALAX / GLYCOLAX) packet        No Known Allergies Follow-up Information    Please follow up.   Why:  Call for a follow-up with your primary care physician to be seen in 1-2 weeks.       The results of significant diagnostics from this hospitalization (including imaging, microbiology, ancillary and laboratory) are listed below for reference.    Significant Diagnostic Studies: Dg Chest Portable 1 View  01/29/2015   CLINICAL DATA:  Fever.  EXAM: PORTABLE CHEST - 1 VIEW  COMPARISON:  01/04/2010  FINDINGS: Cardiomediastinal silhouette is within normal limits. Lungs are well inflated with minimal opacities in the lung bases most suggestive of scarring or atelectasis. No segmental airspace consolidation, edema, pleural effusion, or pneumothorax is identified. No acute osseous abnormality is seen.  IMPRESSION: No evidence of acute airspace disease.   Electronically Signed   By: Logan Bores   On: 01/29/2015 10:41     Microbiology: Recent Results (from the past 240 hour(s))  Blood culture (routine x 2)     Status: None (Preliminary result)   Collection Time: 01/29/15 10:32 AM  Result Value Ref Range Status   Specimen Description BLOOD RIGHT ANTECUBITAL  Final   Special Requests BOTTLES DRAWN AEROBIC AND ANAEROBIC 8CC EACH  Final   Culture NO GROWTH 2 DAYS  Final   Report Status PENDING  Incomplete  Blood culture (routine x 2)     Status: None (Preliminary result)   Collection Time: 01/29/15 10:35 AM  Result Value Ref Range Status   Specimen Description BLOOD RIGHT HAND  Final   Special Requests BOTTLES DRAWN AEROBIC AND ANAEROBIC 6CC EACH  Final   Culture NO GROWTH 2 DAYS  Final   Report Status PENDING  Incomplete  Urine culture     Status: None   Collection Time: 01/29/15 11:08 AM  Result Value Ref Range Status   Specimen Description URINE, CATHETERIZED  Final  Special Requests NONE  Final   Culture   Final    NO GROWTH 2 DAYS Performed at [facility]    Report Status 01/31/2015 FINAL  Final     Labs: Basic Metabolic Panel:  Recent Labs Lab 01/29/15 1013 01/29/15 1029 01/30/15 0603 01/31/15 0549  NA 137 135 136 136  K 3.7 3.6 3.6 3.8  CL 102 100* 102 102  CO2  --  25 28 26   GLUCOSE 131* 129* 90 103*  BUN 21* 22* 17 16  CREATININE 1.70* 1.52* 1.21 1.19  CALCIUM  --  9.1 8.3* 8.4*   Liver Function Tests:  Recent Labs Lab 01/29/15 1029 01/30/15 0603  AST 59* 79*  ALT 42 52  ALKPHOS 85 93  BILITOT 1.1 1.5*  PROT 8.3* 7.0  ALBUMIN 3.7 2.9*   No results for input(s): LIPASE, AMYLASE in the last 168 hours. No results for input(s): AMMONIA in the last 168 hours. CBC:  Recent Labs Lab 01/29/15 1013 01/29/15 1029 01/30/15 0603 01/31/15 0549  WBC  --  3.5* 2.3* 3.7*  NEUTROABS  --  2.9  --  0.9*  HGB 16.0 13.8 13.3 13.1  HCT 47.0 41.6 41.9 40.8  MCV  --  93.5 94.6 93.6  PLT  --  117* 86* 86*   Cardiac Enzymes:  Recent Labs Lab 01/29/15 1333  01/29/15 1911 01/30/15 0125  TROPONINI 0.04* 0.03 0.03   BNP: BNP (last 3 results) No results for input(s): BNP in the last 8760 hours.  ProBNP (last 3 results) No results for input(s): PROBNP in the last 8760 hours.  CBG:  Recent Labs Lab 01/29/15 1004  GLUCAP 131*       Signed:  Kile Kabler  [facility] 01/31/2015, 12:03 PM

## 2015-02-01 ENCOUNTER — Encounter (HOSPITAL_COMMUNITY): Payer: Self-pay | Admitting: Internal Medicine

## 2015-02-01 DIAGNOSIS — R768 Other specified abnormal immunological findings in serum: Secondary | ICD-10-CM

## 2015-02-01 HISTORY — DX: Other specified abnormal immunological findings in serum: R76.8

## 2015-02-01 LAB — HEPATITIS PANEL, ACUTE
HCV Ab: >11 s/co ratio — ABNORMAL HIGH (ref 0.0–0.9)
HEP B S AG: NEGATIVE
Hep A IgM: NEGATIVE
Hep B C IgM: NEGATIVE

## 2015-02-02 NOTE — Progress Notes (Signed)
Patient's viral hepatitis panel was pending at discharge. Results reviewed and were notable for hepatitis C antibody positivity. I called the patient to notify him, but there was no answer. Will try again.

## 2015-02-04 LAB — CULTURE, BLOOD (ROUTINE X 2)
CULTURE: NO GROWTH
CULTURE: NO GROWTH

## 2015-02-09 NOTE — Progress Notes (Signed)
After several attempts, the patient was successfully contacted and was informed that he was Hepatitis C antibody positive. With permission, his wife was informed as well. She was advised to be tested for Hepatitis C. They were both informed to let his physician know, so that further testing can be done. They voiced understanding.

## 2015-12-01 ENCOUNTER — Emergency Department (HOSPITAL_COMMUNITY)
Admission: EM | Admit: 2015-12-01 | Discharge: 2015-12-01 | Disposition: A | Discharge status: Home / Self Care | Payer: Medicare Other | Attending: Emergency Medicine | Admitting: Emergency Medicine

## 2015-12-01 ENCOUNTER — Encounter (HOSPITAL_COMMUNITY): Payer: Self-pay | Admitting: Emergency Medicine

## 2015-12-01 DIAGNOSIS — M1990 Unspecified osteoarthritis, unspecified site: Secondary | ICD-10-CM | POA: Insufficient documentation

## 2015-12-01 DIAGNOSIS — Z79899 Other long term (current) drug therapy: Secondary | ICD-10-CM | POA: Diagnosis not present

## 2015-12-01 DIAGNOSIS — F17210 Nicotine dependence, cigarettes, uncomplicated: Secondary | ICD-10-CM | POA: Insufficient documentation

## 2015-12-01 DIAGNOSIS — H6123 Impacted cerumen, bilateral: Secondary | ICD-10-CM | POA: Insufficient documentation

## 2015-12-01 DIAGNOSIS — Z8673 Personal history of transient ischemic attack (TIA), and cerebral infarction without residual deficits: Secondary | ICD-10-CM | POA: Diagnosis not present

## 2015-12-01 DIAGNOSIS — I10 Essential (primary) hypertension: Secondary | ICD-10-CM | POA: Insufficient documentation

## 2015-12-01 DIAGNOSIS — H9193 Unspecified hearing loss, bilateral: Secondary | ICD-10-CM | POA: Diagnosis present

## 2015-12-01 NOTE — ED Provider Notes (Signed)
CSN: [id number]     Arrival date & time 12/01/15  0636 History   First MD Initiated Contact with Patient 12/01/15 [phone]     Chief Complaint  Patient presents with  . Hearing Loss     (Consider location/radiation/quality/duration/timing/severity/associated sxs/prior Treatment) Patient is a 70 y.o. male presenting with plugged ear sensation. The history is provided by the patient (Patient complains of problems hearing for last 24 hours).  Ear Fullness This is a new problem. The current episode started 12 to 24 hours ago. The problem occurs constantly. The problem has not changed since onset.Pertinent negatives include no chest pain, no abdominal pain and no headaches. The symptoms are aggravated by walking.    Past Medical History  Diagnosis Date  . Stroke ([place])   . Arthritis   . HTN (hypertension)   . Constipation   . Cerebral artery occlusion with cerebral infarction ([place]) 07/15/2011  . Anemia 06/04/2010    Qualifier: Diagnosis of  By: Oneida Alar MD, Sandi L   . Stiffness of joints, not elsewhere classified, multiple sites 11/04/2013  . Diastolic dysfunction 123456    Grade 1. EF 65-70%  . Abnormal EKG 01/29/2015    RBBB and left fascicular block  . Leucopenia 01/30/2015  . Thrombocytopenia ([place]) 01/30/2015  . Hepatitis C antibody test positive 02/01/2015   History reviewed. No pertinent past surgical history. Family History  Problem Relation Age of Onset  . Colon polyps Neg Hx    Social History  Substance Use Topics  . Smoking status: Current Some Day Smoker    Types: Cigarettes  . Smokeless tobacco: None  . Alcohol Use: No    Review of Systems  Constitutional: Negative for appetite change and fatigue.  HENT: Negative for congestion, ear discharge and sinus pressure.        Hearing loss  Eyes: Negative for discharge.  Respiratory: Negative for cough.   Cardiovascular: Negative for chest pain.  Gastrointestinal: Negative for abdominal pain and diarrhea.  Genitourinary:  Negative for frequency and hematuria.  Musculoskeletal: Negative for back pain.  Skin: Negative for rash.  Neurological: Negative for seizures and headaches.  Psychiatric/Behavioral: Negative for hallucinations.      Allergies  Review of patient's allergies indicates no known allergies.  Home Medications   Prior to Admission medications   Medication Sig Start Date End Date Taking? Authorizing Provider  amLODipine (NORVASC) 5 MG tablet Take 5 mg by mouth daily.     Historical Provider, MD  amoxicillin-clavulanate (AUGMENTIN) 500-125 MG per tablet Take 1 tablet (500 mg total) by mouth 2 (two) times daily. Antibiotic to be taken as directed for 7 more days. Take with food. 01/31/15   Rexene Alberts, MD  aspirin EC 81 MG tablet Take 81 mg by mouth daily.    Historical Provider, MD  gabapentin (NEURONTIN) 300 MG capsule Take 300 mg by mouth 2 (two) times daily.    Historical Provider, MD  hydrochlorothiazide (HYDRODIURIL) 25 MG tablet Take 25 mg by mouth daily.    Historical Provider, MD  naproxen (NAPROSYN) 500 MG tablet Take 500 mg by mouth 2 (two) times daily with a meal.    Historical Provider, MD  omeprazole (PRILOSEC) 20 MG capsule Take 20 mg by mouth daily.    Historical Provider, MD   BP 121/79 mmHg  Pulse 95  Temp(Src) 97.4 F (36.3 C) (Oral)  Resp 18  SpO2 100% Physical Exam  Constitutional: He is oriented to person, place, and time. He appears well-developed.  HENT:  Head:  Normocephalic.  Cerumen in both ears  Eyes: Conjunctivae are normal.  Neck: No tracheal deviation present.  Cardiovascular:  No murmur heard. Musculoskeletal: Normal range of motion.  Neurological: He is oriented to person, place, and time.  Skin: Skin is warm.  Psychiatric: He has a normal mood and affect.    ED Course  Procedures (including critical care time) Labs Review Labs Reviewed - No data to display  Imaging Review No results found. I have personally reviewed and evaluated these  images and lab results as part of my medical decision-making.   EKG Interpretation None      MDM   Final diagnoses:  Cerumen impaction, bilateral    Patient had ears irrigated and his hearing improved. He will be referred to ENT   Milton Ferguson, MD 12/01/15 [phone]

## 2015-12-01 NOTE — ED Notes (Signed)
EDP at bedside  

## 2015-12-01 NOTE — Discharge Instructions (Signed)
Follow up with dr. Benjamine Mola or one of his partners for your ears

## 2015-12-01 NOTE — ED Notes (Signed)
Irrigated patient's ear with saline and hydrogen peroxide. Moderate amounts of cerumen noted. Pt reports increase in hearing. Dr. Roderic Palau notified.

## 2015-12-01 NOTE — ED Notes (Addendum)
Pt states he began having difficulty hearing low pitched sounds on the left side, states it feels "stopped up". Per pt he has used cotton swabs in his ear and peroxide. Denies any injury or past hx of hearing loss.

## 2015-12-23 ENCOUNTER — Emergency Department (HOSPITAL_COMMUNITY)
Admission: EM | Admit: 2015-12-23 | Discharge: 2015-12-23 | Disposition: A | Discharge status: Home / Self Care | Payer: Medicare Other | Attending: Emergency Medicine | Admitting: Emergency Medicine

## 2015-12-23 ENCOUNTER — Encounter (HOSPITAL_COMMUNITY): Payer: Self-pay | Admitting: Emergency Medicine

## 2015-12-23 ENCOUNTER — Emergency Department (HOSPITAL_COMMUNITY): Payer: Medicare Other

## 2015-12-23 DIAGNOSIS — Z7982 Long term (current) use of aspirin: Secondary | ICD-10-CM | POA: Insufficient documentation

## 2015-12-23 DIAGNOSIS — Z8673 Personal history of transient ischemic attack (TIA), and cerebral infarction without residual deficits: Secondary | ICD-10-CM | POA: Diagnosis not present

## 2015-12-23 DIAGNOSIS — Y929 Unspecified place or not applicable: Secondary | ICD-10-CM | POA: Diagnosis not present

## 2015-12-23 DIAGNOSIS — I10 Essential (primary) hypertension: Secondary | ICD-10-CM | POA: Insufficient documentation

## 2015-12-23 DIAGNOSIS — M1990 Unspecified osteoarthritis, unspecified site: Secondary | ICD-10-CM | POA: Insufficient documentation

## 2015-12-23 DIAGNOSIS — Y9389 Activity, other specified: Secondary | ICD-10-CM | POA: Diagnosis not present

## 2015-12-23 DIAGNOSIS — Y999 Unspecified external cause status: Secondary | ICD-10-CM | POA: Insufficient documentation

## 2015-12-23 DIAGNOSIS — Z87891 Personal history of nicotine dependence: Secondary | ICD-10-CM | POA: Diagnosis not present

## 2015-12-23 DIAGNOSIS — W19XXXA Unspecified fall, initial encounter: Secondary | ICD-10-CM

## 2015-12-23 DIAGNOSIS — S7002XA Contusion of left hip, initial encounter: Secondary | ICD-10-CM

## 2015-12-23 DIAGNOSIS — S79912A Unspecified injury of left hip, initial encounter: Secondary | ICD-10-CM | POA: Diagnosis present

## 2015-12-23 NOTE — ED Provider Notes (Signed)
CSN: [id number]     Arrival date & time 12/23/15  0705 History   First MD Initiated Contact with Patient 12/23/15 [phone]     Chief Complaint  Patient presents with  . Fall      HPI Patient had mechanical fall onto the ground yesterday. States he landed on his left hip area which is pain. He's had a previous stroke with chronic right-sided weakness. States he fell because he was unsteady with that. No head or neck pain. Did not strike his head. No chest or abdominal pain. Has had difficulty walking due to the pain.   Past Medical History  Diagnosis Date  . Stroke ([place])   . Arthritis   . HTN (hypertension)   . Constipation   . Cerebral artery occlusion with cerebral infarction ([place]) 07/15/2011  . Anemia 06/04/2010    Qualifier: Diagnosis of  By: Oneida Alar MD, Sandi L   . Stiffness of joints, not elsewhere classified, multiple sites 11/04/2013  . Diastolic dysfunction 123456    Grade 1. EF 65-70%  . Abnormal EKG 01/29/2015    RBBB and left fascicular block  . Leucopenia 01/30/2015  . Thrombocytopenia ([place]) 01/30/2015  . Hepatitis C antibody test positive 02/01/2015   History reviewed. No pertinent past surgical history. Family History  Problem Relation Age of Onset  . Colon polyps Neg Hx    Social History  Substance Use Topics  . Smoking status: Former Smoker    Types: Cigarettes  . Smokeless tobacco: Never Used  . Alcohol Use: No    Review of Systems  Constitutional: Negative for activity change.  Respiratory: Negative for cough and shortness of breath.   Cardiovascular: Negative for chest pain.  Gastrointestinal: Negative for abdominal pain.  Musculoskeletal: Negative for back pain and neck pain.       Left hip pain.  Skin: Negative for wound.  Neurological: Positive for weakness.      Allergies  Review of patient's allergies indicates no known allergies.  Home Medications   Prior to Admission medications   Medication Sig Start Date End Date Taking? Authorizing  Provider  amLODipine (NORVASC) 5 MG tablet Take 5 mg by mouth daily.     Historical Provider, MD  amoxicillin-clavulanate (AUGMENTIN) 500-125 MG per tablet Take 1 tablet (500 mg total) by mouth 2 (two) times daily. Antibiotic to be taken as directed for 7 more days. Take with food. 01/31/15   Rexene Alberts, MD  aspirin EC 81 MG tablet Take 81 mg by mouth daily.    Historical Provider, MD  gabapentin (NEURONTIN) 300 MG capsule Take 300 mg by mouth 2 (two) times daily.    Historical Provider, MD  hydrochlorothiazide (HYDRODIURIL) 25 MG tablet Take 25 mg by mouth daily.    Historical Provider, MD  naproxen (NAPROSYN) 500 MG tablet Take 500 mg by mouth 2 (two) times daily with a meal.    Historical Provider, MD  omeprazole (PRILOSEC) 20 MG capsule Take 20 mg by mouth daily.    Historical Provider, MD   BP 147/99 mmHg  Pulse 85  Temp(Src) 98 F (36.7 C) (Oral)  Resp 18  Ht 5\' 6"  (1.676 m)  Wt 200 lb (90.719 kg)  BMI 32.30 kg/m2  SpO2 95% Physical Exam  Constitutional: He appears well-developed.  HENT:  Head: Atraumatic.  Cardiovascular: Normal rate.   Pulmonary/Chest: Effort normal.  Abdominal: There is no tenderness.  Musculoskeletal: He exhibits tenderness.  Tenderness over left hip laterally. Some pain with movement but overall good range  of motion. No real pain with rolling of the leg. No lumbar spine tenderness. No neck or upper extremity tenderness. Good flexion and extension at the left ankle.  Skin: Skin is warm.    ED Course  Procedures (including critical care time) Labs Review Labs Reviewed - No data to display  Imaging Review Dg Hip Unilat With Pelvis 2-3 Views Left  12/23/2015  CLINICAL DATA:  Fall yesterday.  Left hip pain. EXAM: DG HIP (WITH OR WITHOUT PELVIS) 2-3V LEFT COMPARISON:  None. FINDINGS: There is no evidence of hip fracture or dislocation. There is no evidence of arthropathy or other focal bone abnormality. IMPRESSION: Negative. Electronically Signed   By:  Lajean Manes M.D.   On: 12/23/2015 08:26   I have personally reviewed and evaluated these images and lab results as part of my medical decision-making.   EKG Interpretation None      MDM   Final diagnoses:  Fall, initial encounter  Contusion, hip, left, initial encounter    Patient with fall. Hip contusion. Negative x-ray. Doubt fracture but family and patient informed of possibility of occult fracture. Will discharge home.    Davonna Belling, MD 12/23/15 [phone]

## 2015-12-23 NOTE — ED Notes (Signed)
Patient c/o left side lower back, hip, and leg pain. Per patient fell yesterday while trying to get into car. Per patient landed on buttock on ground. Denies dizziness, hitting head, or LOC. Patient states difficult to ambulate since. Patient states lost balance due to weakness on right side from previous stroke.

## 2015-12-23 NOTE — Discharge Instructions (Signed)

## 2018-04-07 ENCOUNTER — Other Ambulatory Visit: Payer: Self-pay

## 2018-04-07 ENCOUNTER — Encounter (HOSPITAL_COMMUNITY): Payer: Self-pay | Admitting: Emergency Medicine

## 2018-04-07 ENCOUNTER — Emergency Department (HOSPITAL_COMMUNITY): Payer: Medicare Other

## 2018-04-07 ENCOUNTER — Emergency Department (HOSPITAL_COMMUNITY)
Admission: EM | Admit: 2018-04-07 | Discharge: 2018-04-07 | Disposition: A | Discharge status: Home / Self Care | Payer: Medicare Other | Attending: Emergency Medicine | Admitting: Emergency Medicine

## 2018-04-07 DIAGNOSIS — S300XXA Contusion of lower back and pelvis, initial encounter: Secondary | ICD-10-CM | POA: Diagnosis not present

## 2018-04-07 DIAGNOSIS — I5032 Chronic diastolic (congestive) heart failure: Secondary | ICD-10-CM | POA: Diagnosis not present

## 2018-04-07 DIAGNOSIS — Z87891 Personal history of nicotine dependence: Secondary | ICD-10-CM | POA: Diagnosis not present

## 2018-04-07 DIAGNOSIS — I110 Hypertensive heart disease with heart failure: Secondary | ICD-10-CM | POA: Insufficient documentation

## 2018-04-07 DIAGNOSIS — Z79899 Other long term (current) drug therapy: Secondary | ICD-10-CM | POA: Insufficient documentation

## 2018-04-07 DIAGNOSIS — Y999 Unspecified external cause status: Secondary | ICD-10-CM | POA: Insufficient documentation

## 2018-04-07 DIAGNOSIS — W19XXXA Unspecified fall, initial encounter: Secondary | ICD-10-CM | POA: Insufficient documentation

## 2018-04-07 DIAGNOSIS — Y929 Unspecified place or not applicable: Secondary | ICD-10-CM | POA: Diagnosis not present

## 2018-04-07 DIAGNOSIS — Z7982 Long term (current) use of aspirin: Secondary | ICD-10-CM | POA: Diagnosis not present

## 2018-04-07 DIAGNOSIS — Y939 Activity, unspecified: Secondary | ICD-10-CM | POA: Insufficient documentation

## 2018-04-07 DIAGNOSIS — S3992XA Unspecified injury of lower back, initial encounter: Secondary | ICD-10-CM | POA: Diagnosis present

## 2018-04-07 MED ORDER — NAPROXEN 500 MG PO TABS: 500.0000 mg | ORAL_TABLET | Freq: Two times a day (BID) | ORAL | 0 refills | Status: DC

## 2018-04-07 NOTE — ED Triage Notes (Signed)
Patient states he fell at the bank this morning. States lower back pain. Denies radiation to lower extremities.

## 2018-04-07 NOTE — Discharge Instructions (Addendum)
See your Physician for recheck.   Continue current medications

## 2018-04-08 NOTE — ED Provider Notes (Signed)
[facility] EMERGENCY DEPARTMENT Provider Note   CSN: [id number] Arrival date & time: 04/07/18  1623     History   Chief Complaint Chief Complaint  Patient presents with  . Fall    HPI Eric [name] is a 72 y.o. male.  The history is provided by the patient. No language interpreter was used.  Fall  This is a new problem. The current episode started 1 to 2 hours ago. The problem occurs constantly. The problem has been gradually worsening. Pertinent negatives include no chest pain and no abdominal pain. Nothing aggravates the symptoms. Nothing relieves the symptoms. He has tried nothing for the symptoms. The treatment provided no relief.  Pt complains of pain in his law back after falling today.  Pt reports pain with walking  Past Medical History:  Diagnosis Date  . Abnormal EKG 01/29/2015   RBBB and left fascicular block  . Anemia 06/04/2010   Qualifier: Diagnosis of  By: Oneida Alar MD, Sandi L   . Arthritis   . Cerebral artery occlusion with cerebral infarction ([place]) 07/15/2011  . Constipation   . Diastolic dysfunction 3/50/0938   Grade 1. EF 65-70%  . Hepatitis C antibody test positive 02/01/2015  . HTN (hypertension)   . Leucopenia 01/30/2015  . Stiffness of joints, not elsewhere classified, multiple sites 11/04/2013  . Stroke ([place])   . Thrombocytopenia ([place]) 01/30/2015    Patient Active Problem List   Diagnosis Date Noted  . Hepatitis C antibody test positive 02/01/2015  . Diastolic dysfunction 18/29/9371  . Leucopenia 01/30/2015  . Thrombocytopenia ([place]) 01/30/2015  . SIRS (systemic inflammatory response syndrome) ([place]) 01/29/2015  . Generalized weakness 01/29/2015  . Diaphoresis 01/29/2015  . Essential hypertension 01/29/2015  . Abnormal EKG 01/29/2015  . Acute kidney injury ([place]) 01/29/2015  . Hyperglycemia 01/29/2015  . UTI (urinary tract infection), bacterial 01/29/2015  . Difficulty in walking(719.7) 11/04/2013  . Stiffness of joints, not elsewhere  classified, multiple sites 11/04/2013  . Cerebral artery occlusion with cerebral infarction ([place]) 07/15/2011  . Lack of coordination 07/15/2011  . Anemia 06/04/2010    History reviewed. No pertinent surgical history.      Home Medications    Prior to Admission medications   Medication Sig Start Date End Date Taking? Authorizing Provider  amLODipine (NORVASC) 5 MG tablet Take 5 mg by mouth daily.     [provider]  amoxicillin-clavulanate (AUGMENTIN) 500-125 MG per tablet Take 1 tablet (500 mg total) by mouth 2 (two) times daily. Antibiotic to be taken as directed for 7 more days. Take with food. 01/31/15   Rexene Alberts, MD  aspirin EC 81 MG tablet Take 81 mg by mouth daily.    [provider]  gabapentin (NEURONTIN) 300 MG capsule Take 300 mg by mouth 2 (two) times daily.    [provider]  hydrochlorothiazide (HYDRODIURIL) 25 MG tablet Take 25 mg by mouth daily.    [provider]  naproxen (NAPROSYN) 500 MG tablet Take 1 tablet (500 mg total) by mouth 2 (two) times daily with a meal. 04/07/18   Fransico Meadow, PA-C  omeprazole (PRILOSEC) 20 MG capsule Take 20 mg by mouth daily.    [provider]    Family History Family History  Problem Relation Age of Onset  . Colon polyps Neg Hx     Social History Social History   Tobacco Use  . Smoking status: Former Smoker    Types: Cigarettes  . Smokeless tobacco: Never Used  Substance  Use Topics  . Alcohol use: No  . Drug use: No     Allergies   Patient has no known allergies.   Review of Systems Review of Systems  Cardiovascular: Negative for chest pain.  Gastrointestinal: Negative for abdominal pain.  All other systems reviewed and are negative.    Physical Exam Updated Vital Signs BP 134/71 (BP Location: Left Arm)   Pulse 85   Temp 98.5 F (36.9 C) (Oral)   Resp 18   Wt 90.7 kg   SpO2 100%   BMI 32.28 kg/m   Physical Exam  Constitutional: He appears  well-developed and well-nourished.  HENT:  Head: Normocephalic.  Cardiovascular: Normal rate.  Pulmonary/Chest: Effort normal.  Abdominal: Soft.  Musculoskeletal: Normal range of motion.  Neurological: He is alert.  Skin: Skin is warm.  Psychiatric: He has a normal mood and affect.  Nursing note and vitals reviewed.    ED Treatments / Results  Labs (all labs ordered are listed, but only abnormal results are displayed) Labs Reviewed - No data to display  EKG None  Radiology Dg Lumbar Spine Complete  Result Date: 04/07/2018 CLINICAL DATA:  Severe lower back pain after fall. EXAM: LUMBAR SPINE - COMPLETE 4+ VIEW COMPARISON:  Abdominal x-rays dated August 24, 2009. FINDINGS: Five lumbar type vertebral bodies. No acute fracture or subluxation. Vertebral body heights are preserved. Mild levocurvature. Straightening of the normal lumbar lordosis. Moderate to severe disc height loss from L1-L2 through L5-S1. Mild lumbar facet arthropathy. The sacroiliac joints are unremarkable. Aortoiliac atherosclerotic vascular disease. IMPRESSION: 1.  No acute osseous abnormality. 2. Moderate to severe multilevel lumbar spondylosis. 3.  Aortic atherosclerosis (ICD10-I70.0). Electronically Signed   By: Titus Dubin M.D.   On: 04/07/2018 18:33   Dg Pelvis 1-2 Views  Result Date: 04/07/2018 CLINICAL DATA:  Pain after fall earlier today. EXAM: PELVIS - 1-2 VIEW COMPARISON:  None. FINDINGS: There is no evidence of pelvic fracture or diastasis. No pelvic bone lesions are seen. IMPRESSION: Negative. Electronically Signed   By: Dorise Bullion III M.D   On: 04/07/2018 18:32    Procedures Procedures (including critical care time)  Medications Ordered in ED Medications - No data to display   Initial Impression / Assessment and Plan / ED Course  I have reviewed the triage vital signs and the nursing notes.  Pertinent labs & imaging results that were available during my care of the patient were reviewed by  me and considered in my medical decision making (see chart for details).     MDM  Xray reviewed and discussed with pt.  Pt states he normally takes aleve for pain.    Final Clinical Impressions(s) / ED Diagnoses   Final diagnoses:  Contusion of lower back, initial encounter    ED Discharge Orders         Ordered    naproxen (NAPROSYN) 500 MG tablet  2 times daily with meals     04/07/18 1911        An After Visit Summary was printed and given to the patient.    Fransico Meadow, [place] 04/08/18 3086    Margette Fast, MD 04/08/18 [id number]

## 2019-04-11 ENCOUNTER — Inpatient Hospital Stay (HOSPITAL_COMMUNITY)
Admission: EM | Admit: 2019-04-11 | Discharge: 2019-04-17 | DRG: 812 | Disposition: A | Discharge status: Skilled Nursing Facility | Payer: Medicare Other | Attending: Internal Medicine | Admitting: Internal Medicine

## 2019-04-11 ENCOUNTER — Encounter (HOSPITAL_COMMUNITY): Payer: Self-pay

## 2019-04-11 ENCOUNTER — Other Ambulatory Visit: Payer: Self-pay

## 2019-04-11 ENCOUNTER — Emergency Department (HOSPITAL_COMMUNITY): Payer: Medicare Other

## 2019-04-11 DIAGNOSIS — K449 Diaphragmatic hernia without obstruction or gangrene: Secondary | ICD-10-CM | POA: Diagnosis present

## 2019-04-11 DIAGNOSIS — F17200 Nicotine dependence, unspecified, uncomplicated: Secondary | ICD-10-CM | POA: Diagnosis not present

## 2019-04-11 DIAGNOSIS — B9681 Helicobacter pylori [H. pylori] as the cause of diseases classified elsewhere: Secondary | ICD-10-CM | POA: Diagnosis present

## 2019-04-11 DIAGNOSIS — K621 Rectal polyp: Secondary | ICD-10-CM | POA: Diagnosis present

## 2019-04-11 DIAGNOSIS — R739 Hyperglycemia, unspecified: Secondary | ICD-10-CM | POA: Diagnosis present

## 2019-04-11 DIAGNOSIS — B1920 Unspecified viral hepatitis C without hepatic coma: Secondary | ICD-10-CM | POA: Diagnosis present

## 2019-04-11 DIAGNOSIS — F17210 Nicotine dependence, cigarettes, uncomplicated: Secondary | ICD-10-CM | POA: Diagnosis present

## 2019-04-11 DIAGNOSIS — F05 Delirium due to known physiological condition: Secondary | ICD-10-CM | POA: Diagnosis present

## 2019-04-11 DIAGNOSIS — N179 Acute kidney failure, unspecified: Secondary | ICD-10-CM | POA: Diagnosis present

## 2019-04-11 DIAGNOSIS — R279 Unspecified lack of coordination: Secondary | ICD-10-CM | POA: Diagnosis present

## 2019-04-11 DIAGNOSIS — Z20828 Contact with and (suspected) exposure to other viral communicable diseases: Secondary | ICD-10-CM | POA: Diagnosis present

## 2019-04-11 DIAGNOSIS — K922 Gastrointestinal hemorrhage, unspecified: Secondary | ICD-10-CM

## 2019-04-11 DIAGNOSIS — R262 Difficulty in walking, not elsewhere classified: Secondary | ICD-10-CM | POA: Diagnosis present

## 2019-04-11 DIAGNOSIS — Z23 Encounter for immunization: Secondary | ICD-10-CM

## 2019-04-11 DIAGNOSIS — I10 Essential (primary) hypertension: Secondary | ICD-10-CM | POA: Diagnosis present

## 2019-04-11 DIAGNOSIS — K626 Ulcer of anus and rectum: Secondary | ICD-10-CM | POA: Diagnosis present

## 2019-04-11 DIAGNOSIS — D509 Iron deficiency anemia, unspecified: Secondary | ICD-10-CM

## 2019-04-11 DIAGNOSIS — R531 Weakness: Secondary | ICD-10-CM

## 2019-04-11 DIAGNOSIS — D125 Benign neoplasm of sigmoid colon: Secondary | ICD-10-CM | POA: Diagnosis not present

## 2019-04-11 DIAGNOSIS — K635 Polyp of colon: Secondary | ICD-10-CM | POA: Diagnosis present

## 2019-04-11 DIAGNOSIS — Z7982 Long term (current) use of aspirin: Secondary | ICD-10-CM | POA: Diagnosis not present

## 2019-04-11 DIAGNOSIS — I5189 Other ill-defined heart diseases: Secondary | ICD-10-CM | POA: Diagnosis not present

## 2019-04-11 DIAGNOSIS — D500 Iron deficiency anemia secondary to blood loss (chronic): Secondary | ICD-10-CM | POA: Diagnosis present

## 2019-04-11 DIAGNOSIS — K2970 Gastritis, unspecified, without bleeding: Secondary | ICD-10-CM | POA: Diagnosis present

## 2019-04-11 DIAGNOSIS — I452 Bifascicular block: Secondary | ICD-10-CM | POA: Diagnosis present

## 2019-04-11 DIAGNOSIS — Z79899 Other long term (current) drug therapy: Secondary | ICD-10-CM

## 2019-04-11 DIAGNOSIS — K5730 Diverticulosis of large intestine without perforation or abscess without bleeding: Secondary | ICD-10-CM | POA: Diagnosis present

## 2019-04-11 DIAGNOSIS — R195 Other fecal abnormalities: Secondary | ICD-10-CM | POA: Diagnosis not present

## 2019-04-11 DIAGNOSIS — E860 Dehydration: Secondary | ICD-10-CM | POA: Diagnosis present

## 2019-04-11 DIAGNOSIS — D649 Anemia, unspecified: Secondary | ICD-10-CM

## 2019-04-11 DIAGNOSIS — I69351 Hemiplegia and hemiparesis following cerebral infarction affecting right dominant side: Secondary | ICD-10-CM | POA: Diagnosis not present

## 2019-04-11 DIAGNOSIS — I951 Orthostatic hypotension: Secondary | ICD-10-CM | POA: Diagnosis present

## 2019-04-11 DIAGNOSIS — K222 Esophageal obstruction: Secondary | ICD-10-CM | POA: Diagnosis not present

## 2019-04-11 DIAGNOSIS — D122 Benign neoplasm of ascending colon: Secondary | ICD-10-CM | POA: Diagnosis not present

## 2019-04-11 DIAGNOSIS — R5381 Other malaise: Secondary | ICD-10-CM

## 2019-04-11 LAB — IRON AND TIBC
Iron: 8 ug/dL — ABNORMAL LOW (ref 45–182)
Saturation Ratios: 2 % — ABNORMAL LOW (ref 17.9–39.5)
TIBC: 393 ug/dL (ref 250–450)
UIBC: 385 ug/dL

## 2019-04-11 LAB — CBC WITH DIFFERENTIAL/PLATELET
Abs Immature Granulocytes: 0.02 10*3/uL (ref 0.00–0.07)
Basophils Absolute: 0 10*3/uL (ref 0.0–0.1)
Basophils Relative: 0 %
Eosinophils Absolute: 0.1 10*3/uL (ref 0.0–0.5)
Eosinophils Relative: 1 %
HCT: 23.2 % — ABNORMAL LOW (ref 39.0–52.0)
Hemoglobin: 5.6 g/dL — CL (ref 13.0–17.0)
Immature Granulocytes: 0 %
Lymphocytes Relative: 9 %
Lymphs Abs: 0.6 10*3/uL — ABNORMAL LOW (ref 0.7–4.0)
MCH: 18.2 pg — ABNORMAL LOW (ref 26.0–34.0)
MCHC: 24.1 g/dL — ABNORMAL LOW (ref 30.0–36.0)
MCV: 75.6 fL — ABNORMAL LOW (ref 80.0–100.0)
Monocytes Absolute: 0.4 10*3/uL (ref 0.1–1.0)
Monocytes Relative: 6 %
Neutro Abs: 5.2 10*3/uL (ref 1.7–7.7)
Neutrophils Relative %: 84 %
Platelets: 304 10*3/uL (ref 150–400)
RBC: 3.07 MIL/uL — ABNORMAL LOW (ref 4.22–5.81)
RDW: 19 % — ABNORMAL HIGH (ref 11.5–15.5)
WBC: 6.3 10*3/uL (ref 4.0–10.5)
nRBC: 0 % (ref 0.0–0.2)

## 2019-04-11 LAB — URINALYSIS, ROUTINE W REFLEX MICROSCOPIC
Bilirubin Urine: NEGATIVE
Glucose, UA: NEGATIVE mg/dL
Hgb urine dipstick: NEGATIVE
Ketones, ur: NEGATIVE mg/dL
Leukocytes,Ua: NEGATIVE
Nitrite: NEGATIVE
Protein, ur: NEGATIVE mg/dL
Specific Gravity, Urine: 1.015 (ref 1.005–1.030)
pH: 6 (ref 5.0–8.0)

## 2019-04-11 LAB — COMPREHENSIVE METABOLIC PANEL
ALT: 15 U/L (ref 0–44)
AST: 24 U/L (ref 15–41)
Albumin: 4.1 g/dL (ref 3.5–5.0)
Alkaline Phosphatase: 62 U/L (ref 38–126)
Anion gap: 9 (ref 5–15)
BUN: 20 mg/dL (ref 8–23)
CO2: 23 mmol/L (ref 22–32)
Calcium: 9.1 mg/dL (ref 8.9–10.3)
Chloride: 104 mmol/L (ref 98–111)
Creatinine, Ser: 1.42 mg/dL — ABNORMAL HIGH (ref 0.61–1.24)
GFR calc Af Amer: 56 mL/min — ABNORMAL LOW (ref >60)
GFR calc non Af Amer: 49 mL/min — ABNORMAL LOW (ref >60)
Glucose, Bld: 144 mg/dL — ABNORMAL HIGH (ref 70–99)
Potassium: 3.6 mmol/L (ref 3.5–5.1)
Sodium: 136 mmol/L (ref 135–145)
Total Bilirubin: 0.4 mg/dL (ref 0.3–1.2)
Total Protein: 8 g/dL (ref 6.5–8.1)

## 2019-04-11 LAB — ETHANOL: Alcohol, Ethyl (B): <10 mg/dL (ref <10)

## 2019-04-11 LAB — PREPARE RBC (CROSSMATCH)

## 2019-04-11 LAB — FERRITIN: Ferritin: 4 ng/mL — ABNORMAL LOW (ref 24–336)

## 2019-04-11 LAB — RETICULOCYTES
Immature Retic Fract: 18 % — ABNORMAL HIGH (ref 2.3–15.9)
RBC.: 3.11 MIL/uL — ABNORMAL LOW (ref 4.22–5.81)
Retic Count, Absolute: 43.9 10*3/uL (ref 19.0–186.0)
Retic Ct Pct: 1.4 % (ref 0.4–3.1)

## 2019-04-11 LAB — POC OCCULT BLOOD, ED: Fecal Occult Bld: POSITIVE — AB

## 2019-04-11 LAB — VITAMIN B12: Vitamin B-12: 204 pg/mL (ref 180–914)

## 2019-04-11 LAB — SARS CORONAVIRUS 2 BY RT PCR (HOSPITAL ORDER, PERFORMED IN ~~LOC~~ HOSPITAL LAB): SARS Coronavirus 2: NEGATIVE

## 2019-04-11 LAB — RAPID URINE DRUG SCREEN, HOSP PERFORMED
Amphetamines: NOT DETECTED
Barbiturates: NOT DETECTED
Benzodiazepines: NOT DETECTED
Cocaine: NOT DETECTED
Opiates: NOT DETECTED
Tetrahydrocannabinol: NOT DETECTED

## 2019-04-11 LAB — TSH: TSH: 4.166 u[IU]/mL (ref 0.350–4.500)

## 2019-04-11 LAB — ABO/RH: ABO/RH(D): O POS

## 2019-04-11 MED ORDER — ATORVASTATIN CALCIUM 40 MG PO TABS
40.0000 mg | ORAL_TABLET | Freq: Every day | ORAL | Status: DC
  Administered 2019-04-11 – 2019-04-16 (×6): 40 mg via ORAL
  Filled 2019-04-11 (×6): qty 1

## 2019-04-11 MED ORDER — PANTOPRAZOLE SODIUM 40 MG IV SOLR
40.0000 mg | Freq: Two times a day (BID) | INTRAVENOUS | Status: DC
  Administered 2019-04-11 – 2019-04-13 (×5): 40 mg via INTRAVENOUS
  Filled 2019-04-11 (×5): qty 40

## 2019-04-11 MED ORDER — ONDANSETRON HCL 4 MG PO TABS: 4.0000 mg | ORAL_TABLET | Freq: Four times a day (QID) | ORAL | Status: DC | PRN

## 2019-04-11 MED ORDER — ONDANSETRON HCL 4 MG/2ML IJ SOLN
4.0000 mg | Freq: Four times a day (QID) | INTRAMUSCULAR | Status: DC | PRN
  Administered 2019-04-12: 4 mg via INTRAVENOUS
  Filled 2019-04-11: qty 2

## 2019-04-11 MED ORDER — PNEUMOCOCCAL VAC POLYVALENT 25 MCG/0.5ML IJ INJ
0.5000 mL | INJECTION | INTRAMUSCULAR | Status: AC
  Administered 2019-04-12: 0.5 mL via INTRAMUSCULAR
  Filled 2019-04-11: qty 0.5

## 2019-04-11 MED ORDER — ACETAMINOPHEN 325 MG PO TABS: 650.0000 mg | ORAL_TABLET | Freq: Four times a day (QID) | ORAL | Status: DC | PRN

## 2019-04-11 MED ORDER — SODIUM CHLORIDE 0.9 % IV SOLN: 10.0000 mL/h | Freq: Once | INTRAVENOUS | Status: DC

## 2019-04-11 MED ORDER — NICOTINE 21 MG/24HR TD PT24
21.0000 mg | MEDICATED_PATCH | Freq: Every day | TRANSDERMAL | Status: DC | PRN
  Administered 2019-04-11 – 2019-04-12 (×2): 21 mg via TRANSDERMAL
  Filled 2019-04-11 (×3): qty 1

## 2019-04-11 MED ORDER — POLYETHYLENE GLYCOL 3350 17 G PO PACK
17.0000 g | PACK | ORAL | Status: AC
  Administered 2019-04-11 (×4): 17 g via ORAL
  Filled 2019-04-11: qty 1

## 2019-04-11 MED ORDER — ACETAMINOPHEN 650 MG RE SUPP: 650.0000 mg | Freq: Four times a day (QID) | RECTAL | Status: DC | PRN

## 2019-04-11 MED ORDER — SODIUM CHLORIDE 0.9 % IV SOLN
INTRAVENOUS | Status: DC
  Administered 2019-04-11 – 2019-04-12 (×2): via INTRAVENOUS

## 2019-04-11 NOTE — Consult Note (Addendum)
Referring Provider: No ref. provider found Primary Care Physician:  Beacher May, MD Primary Gastroenterologist:  Barney Drain  Reason for Consultation:  ANEMIA   Impression: ADMITTED WITH MICROCYTIC ANEMIA.  DIFFERENTIAL DIAGNOSIS INCLUDES: COLON POLYPS, PUD, AVMs, H PYLORI GASTRITIS, CAMERON'S EROSIONS,  ATROPHIC GASTRITIS, CELIAC DISEASE, OR LESS LIKELY COLON CANCER.  Plan: 1. FULL LIQUID DIET. 2. [place]. 3. TCS/EGD WITH MAC SEP 8. 4. TRANFUSE PRN. 5. PROTONIX BID  DISCUSSED PROCEDURE, BENEFITS, AND MANAGEMENT OF ANEMIA WITH WIFE.    HPI:  History obtained from the patient & HIS [phone]).  LAST SEEN IN 2011 FOR CONSTIPATION. WAS TAKING ASA/CELEBREX AT THE TIME. WAS 196 LBS. NOTED TO HAVE A NORMOCYTIC ANEMIA(Hb 13.3, MCV 94.9, Cr 1.07). HAD TCS/EGD DEC 2011(D75, V4) & FINDINGS INCLUDE TORTUOUS COLON, SIMPLE ADENOMA(1),HYPERPLASTIC POLYP, DIVERTICULOSIS, H PYLORI GASTRITIS, HIATAL HERNIA. NO RECORD IN EMR OF FOLLOW UP IN [place]. IN 2017 PT WEIGHED 200 LBS AND HAD BEEN HAVING TROUBLE FALLING. [facility] [place]. HAD CVA > 10 YRS AGO. TREATED FOR HEP C IN [place]. WEIGHT THIS ADMISSION 144 LBS.   PRESENTED TO ED TODAY AND HAD COMPLAINT OF DIZZINESS AND TROUBLE GETTING OUT OF THE SHOWER. WILL PASS OUT WHEN HE HAS TO CUT HIS HAIR & HE LEANS HIS HEAD BACK. BEEN HAPPENING OFF & ON IN PAST 6 MOS. WIFE HAD STROKE 12 YRS AGO.   PT DENIES FEVER, CHILLS, HEMATOCHEZIA, HEMATEMESIS, nausea, vomiting, melena, diarrhea, CHEST PAIN, SHORTNESS OF BREATH, CHANGE IN BOWEL IN HABITS, constipation, abdominal pain, problems swallowing, problems with sedation, OR heartburn or indigestion.   Past Medical History:  Diagnosis Date  . Abnormal EKG 01/29/2015   RBBB and left fascicular block  . Anemia 06/04/2010   Qualifier: Diagnosis of  By: Oneida Alar MD, Sanjith Siwek L   . Arthritis   . Cerebral artery occlusion with cerebral infarction ([place]) 07/15/2011  . Constipation   . Diastolic  dysfunction 5/88/5027   Grade 1. EF 65-70%  . Hepatitis C antibody test positive 02/01/2015  . HTN (hypertension)   . Leucopenia 01/30/2015  . Stiffness of joints, not elsewhere classified, multiple sites 11/04/2013  . Stroke ([place])   . Thrombocytopenia ([place]) 01/30/2015   Past Surgical History:  Procedure Laterality Date  . COLONOSCOPY  07/2010   FOR ANEMIA-DIVERTICULOSIS  . ESOPHAGOGASTRODUODENOSCOPY  07/2010   H PYLORI GASTRITIS    Prior to Admission medications   Medication Sig Start Date End Date Taking? Authorizing Provider  aspirin EC 81 MG tablet Take 81 mg by mouth daily.   Yes [provider]  atorvastatin (LIPITOR) 80 MG tablet Take 40 mg by mouth at bedtime.   Yes [provider]  docusate sodium (COLACE) 100 MG capsule Take 100 mg by mouth daily.   Yes [provider]  hydrochlorothiazide (HYDRODIURIL) 25 MG tablet Take 25 mg by mouth daily.   Yes [provider]    Current Facility-Administered Medications  Medication Dose Route Frequency Provider Last Rate Last Dose  . 0.9 %  sodium chloride infusion  10 mL/hr Intravenous Once Johnson, Clanford L, MD      . 0.9 %  sodium chloride infusion   Intravenous Continuous Johnson, Clanford L, MD      . acetaminophen (TYLENOL) tablet 650 mg  650 mg Oral Q6H PRN Johnson, Clanford L, MD       Or  . acetaminophen (TYLENOL) suppository 650 mg  650 mg Rectal Q6H PRN Johnson, Clanford L, MD      . atorvastatin (  LIPITOR) tablet 40 mg  40 mg Oral QHS Johnson, Clanford L, MD      . nicotine (NICODERM CQ - dosed in mg/24 hours) patch 21 mg  21 mg Transdermal Daily PRN Johnson, Clanford L, MD      . ondansetron (ZOFRAN) tablet 4 mg  4 mg Oral Q6H PRN Johnson, Clanford L, MD       Or  . ondansetron (ZOFRAN) injection 4 mg  4 mg Intravenous Q6H PRN Johnson, Clanford L, MD      . pantoprazole (PROTONIX) injection 40 mg  40 mg Intravenous Q12H Johnson, Clanford L, MD       Allergies as of 04/11/2019  . (No  Known Allergies)   Family History  Problem Relation Age of Onset  . Colon polyps Neg Hx   . Colon cancer Neg Hx    Social History   Socioeconomic History  . Marital status: Married    Spouse name: Not on file  . Number of children: Not on file  . Years of education: Not on file  . Highest education level: Not on file  Occupational History  . Not on file  Social Needs  . Financial resource strain: Not on file  . Food insecurity    Worry: Not on file    Inability: Not on file  . Transportation needs    Medical: Not on file    Non-medical: Not on file  Tobacco Use  . Smoking status: Current Every Day Smoker    Packs/day: 1.00    Types: Cigarettes  . Smokeless tobacco: Never Used  Substance and Sexual Activity  . Alcohol use: No  . Drug use: No  . Sexual activity: Never  Lifestyle  . Physical activity    Days per week: Not on file    Minutes per session: Not on file  . Stress: Not on file  Relationships  . Social Herbalist on phone: Not on file    Gets together: Not on file    Attends religious service: Not on file    Active member of club or organization: Not on file    Attends meetings of clubs or organizations: Not on file    Relationship status: Not on file  Other Topics Concern  . 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 SPENDS FREE TIME: [employer]. BIBLE STUDY. WIFE DOES SHOPPING & DRIVING. [facility].    Review of Systems: PER HPI OTHERWISE ALL SYSTEMS ARE NEGATIVE.   Vitals: Blood pressure 129/62, pulse 68, temperature 98 F (36.7 C), temperature source Oral, resp. rate 16, height _0  (1.727 m), weight 65.7 kg, SpO2 100 %.  Physical Exam: General:   Alert,  Well-developed, well-nourished, pleasant and cooperative in NAD Head:  Normocephalic and atraumatic. Eyes:  Sclera clear, no icterus.  Conjunctiva pink. Mouth: dentition ABnormal. Neck:  Supple; no  masses. . Lungs:  Clear throughout to auscultation.   No wheezes. No acute distress. Heart:  Regular rate and rhythm; no murmurs, clicks, rubs,  or gallops. Abdomen:  Soft, nontender and nondistended. No masses noted. Normal bowel sounds, without guarding, and without rebound.   Msk:  ASymmetrical withut gross deformities.  Extremities:  Without edema. Neurologic:  Alert and  oriented x4; NO NEW FOCAL DEFICITS Cervical Nodes:  No significant adenopathy. Psych:  Alert and cooperative. Normal mood and affect.   Lab Results: Recent Labs  04/11/19 0751  WBC 6.3  HGB 5.6*  HCT 23.2*  PLT 304   BMET Recent Labs    04/11/19 0751  NA 136  K 3.6  CL 104  CO2 23  GLUCOSE 144*  BUN 20  CREATININE 1.42*  CALCIUM 9.1   LFT Recent Labs    04/11/19 0751  PROT 8.0  ALBUMIN 4.1  AST 24  ALT 15  ALKPHOS 62  BILITOT 0.4     Studies/Results: SEP 6 ABD-NO ACUTE INTRAABDOMINAL PROCESS    LOS: 0 days   Kaeden Mester  04/11/2019, 11:03 AM

## 2019-04-11 NOTE — H&P (Signed)
History and Physical  Eric [name] [id number] DOB: [birth year] DOA: 04/11/2019  Referring physician: Lita Mains, MD   PCP: Eric May, MD   Chief Complaint: abnormal behavior  HPI: Eric [name] is a 73 y.o. male with cerebrovascular disease s/p old CVA with residual right hemiparesis was brought from home because wife became concerned that patient has been up all night wandering the house.  He has progressively we can having a difficult time having bowel movements.  He takes aspirin daily for secondary stroke prevention.  Upon further questioning, wife reported that patient has had some unsteadiness on his feet.  He has had increasing generalized weakness and lightheadedness.  He has had some episodes of diaphoresis.  No blood reported in stool.  No black stool.  He has chronic constipation.  His PCP is Dr. Aline [name] with the [facility].  He saw Dr. Oneida [name] in 2011 for anemia.  ED course: Hemoglobin 5.6, heme positive stool, abdominal x-ray no acute findings, normal urinalysis platelets 304, WBC 6.3, creatinine 1.42.  UDS negative.  Glucose 144.  Patient was typed and crossed for 2 unit packed red blood cell transfusion and GI consultation was requested.  Review of Systems: All systems reviewed and apart from history of presenting illness, are negative.  Past Medical History:  Diagnosis Date  . Abnormal EKG 01/29/2015   RBBB and left fascicular block  . Anemia 06/04/2010   Qualifier: Diagnosis of  By: Eric Alar MD, Sandi L   . Arthritis   . Cerebral artery occlusion with cerebral infarction ([place]) 07/15/2011  . Constipation   . Diastolic dysfunction [id number]   Grade 1. EF 65-70%  . Hepatitis C antibody test positive 02/01/2015  . HTN (hypertension)   . Leucopenia 01/30/2015  . Stiffness of joints, not elsewhere classified, multiple sites 11/04/2013  . Stroke ([place])   . Thrombocytopenia ([place]) 01/30/2015   History reviewed. No pertinent surgical history. Social History:   reports that he has been smoking cigarettes. He has been smoking about 1.00 pack per day. He has never used smokeless tobacco. He reports that he does not drink alcohol or use drugs.  No Known Allergies  Family History  Problem Relation Age of Onset  . Colon polyps Neg Hx     Prior to Admission medications   Medication Sig Start Date End Date Taking? Authorizing Provider  aspirin EC 81 MG tablet Take 81 mg by mouth daily.   Yes [provider]  atorvastatin (LIPITOR) 80 MG tablet Take 40 mg by mouth at bedtime.   Yes [provider]  docusate sodium (COLACE) 100 MG capsule Take 100 mg by mouth daily.   Yes [provider]  hydrochlorothiazide (HYDRODIURIL) 25 MG tablet Take 25 mg by mouth daily.   Yes [provider]   Physical Exam: Vitals:   04/11/19 0706 04/11/19 0709 04/11/19 0730 04/11/19 0830  BP:  118/60 (!) 107/58 (!) 112/47  Pulse:  79    Resp:  19 18 20   Temp:  97.6 F (36.4 C)    TempSrc:  Oral    SpO2:  100%    Weight: 86.2 kg        General exam: Moderately built and nourished patient, lying comfortably supine on the gurney in no obvious distress.  Head, eyes and ENT: Nontraumatic and normocephalic. Pupils equally reacting to light and accommodation. Oral mucosa dry.  Neck: Supple. No JVD, carotid bruit or thyromegaly.  Lymphatics: No lymphadenopathy.  Respiratory system:  No increased work of  breathing.  Cardiovascular system: normal S1 and S2 heard. No JVD, murmurs, gallops, clicks or pedal edema.  Gastrointestinal system: Abdomen is nondistended, soft and nontender. Normal bowel sounds heard. No organomegaly or masses appreciated.  Central nervous system: Alert and oriented x2. Residual right hemiparesis.   Extremities: Symmetric 5 x 5 power. Peripheral pulses symmetrically felt.   Skin: No rashes or acute findings.  Musculoskeletal system: Negative exam.  Psychiatry: Pleasant and cooperative.  Labs on  Admission:  Basic Metabolic Panel: Recent Labs  Lab 04/11/19 0751  NA 136  K 3.6  CL 104  CO2 23  GLUCOSE 144*  BUN 20  CREATININE 1.42*  CALCIUM 9.1   Liver Function Tests: Recent Labs  Lab 04/11/19 0751  AST 24  ALT 15  ALKPHOS 62  BILITOT 0.4  PROT 8.0  ALBUMIN 4.1   No results for input(s): LIPASE, AMYLASE in the last 168 hours. No results for input(s): AMMONIA in the last 168 hours. CBC: Recent Labs  Lab 04/11/19 0751  WBC 6.3  NEUTROABS 5.2  HGB 5.6*  HCT 23.2*  MCV 75.6*  PLT 304   Cardiac Enzymes: No results for input(s): CKTOTAL, CKMB, CKMBINDEX, TROPONINI in the last 168 hours.  BNP (last 3 results) No results for input(s): PROBNP in the last 8760 hours. CBG: No results for input(s): GLUCAP in the last 168 hours.  Radiological Exams on Admission: Dg Abdomen 1 View  Result Date: 04/11/2019 CLINICAL DATA:  Abdominal distension. EXAM: ABDOMEN - 1 VIEW COMPARISON:  Chest and two views abdomen 08/24/2009. FINDINGS: The bowel gas pattern is normal. No radio-opaque calculi or other significant radiographic abnormality are seen. Multilevel lumbar spondylosis is identified. Convex left thoracolumbar curvature. IMPRESSION: No acute finding. Electronically Signed   By: Inge Rise M.D.   On: 04/11/2019 08:31   EKG: Personally reviewed. Normal sinus rhythm   Assessment/Plan Principal Problem:   Symptomatic anemia Active Problems:   Heme positive stool   Lack of coordination   Difficulty walking   Generalized weakness   Essential hypertension   Acute kidney injury ([place])   Hyperglycemia   Diastolic dysfunction   Orthostatic hypotension   Current smoker  1. Symptomatic anemia- secondary to likely upper GI bleeding, type and screen and transfuse 2 units PRBC and follow CBC closely.  Monitor telemetry.  Anemia panel pending. 2. Chronic blood loss anemia- suspect upper GI bleeding, IV Protonix 40 mg twice daily ordered, hold aspirin temporarily, GI  consult requested.  Clear liquid diet. 3. Essential hypertension-blood pressures are soft at this time holding any antihypertensives. 4. AKI- he is clinically dehydrated and we did order gentle hydration with IV normal saline 50 cc/h. 5. Hyperglycemia-no history of diabetes mellitus, add hemoglobin A1c to labs. 6. Cerebrovascular disease status post CVA- temporarily holding aspirin, continue atorvastatin. 7. Generalized weakness-likely secondary to anemia, PT evaluation requested after medically stabilized. 8. Tobacco-nicotine patch ordered.  Counseled on cessation.  DVT Prophylaxis: SCDs Code Status: Full Family Communication: Wife at bedside Disposition Plan: Inpatient  Time spent: 45 minutes  Irwin Brakeman, MD [facility] How to contact the [facility] Attending or Consulting provider [place] or covering provider during after hours [place], for this patient?  1. Check the care team in [facility] and look for a) attending/consulting [facility] provider listed and b) the [facility] team listed 2. Log into [email or web address] and use [place]'s universal password to access. If you do not have the password, please contact the hospital operator. 3. Locate the [facility] provider  you are looking for under [facility] and page to a number that you can be directly reached. 4. If you still have difficulty reaching the provider, please page the [facility] (Director on Call) for the Hospitalists listed on amion for assistance.

## 2019-04-11 NOTE — ED Notes (Signed)
CRITICAL VALUE ALERT  Critical Value: HBG 5.6  Date & Time Notied:  04/11/19 0818  Provider Notified:yelveton  Orders Received/Actions taken:

## 2019-04-11 NOTE — ED Notes (Signed)
Pt reports he has been dizzy and wife was unable to get him out of tub this morning

## 2019-04-11 NOTE — ED Triage Notes (Addendum)
Pt brought from home . Per EMS wife reports the had been up all night wandering Pt states he was up passing gas and had to go to [facility] and unable to go . Denies any pain, SOB  CBG 156. Has hx of old stroke with right sided weakness

## 2019-04-11 NOTE — ED Provider Notes (Signed)
[facility] EMERGENCY DEPARTMENT Provider Note   CSN: [id number] Arrival date & time: 04/11/19  [id number]     History   Chief Complaint Chief Complaint  Patient presents with   Altered Mental Status   Constipation    HPI Eric [name] is a 73 y.o. male.     HPI Patient presents by EMS.  Per EMS report, wife called because patient has been confused and wandering around all night.  Also stated that he has been constipated.  Patient denies any pain.  States he has been passing gas.  No nausea or vomiting.  Has residual weakness on his right side from previous stroke.  No new weakness.  Patient does have some mild confusion.  History is limited due to this.  Level 5 caveat applies. Past Medical History:  Diagnosis Date   Abnormal EKG 01/29/2015   RBBB and left fascicular block   Anemia 06/04/2010   Qualifier: Diagnosis of  By: Oneida Alar MD, Sandi L    Arthritis    Cerebral artery occlusion with cerebral infarction ([place]) 07/15/2011   Constipation    Diastolic dysfunction [id number]   Grade 1. EF 65-70%   Hepatitis C antibody test positive 02/01/2015   HTN (hypertension)    Leucopenia 01/30/2015   Stiffness of joints, not elsewhere classified, multiple sites 11/04/2013   Stroke ([place])    Thrombocytopenia ([place]) 01/30/2015    Patient Active Problem List   Diagnosis Date Noted   Heme positive stool 04/11/2019   Orthostatic hypotension 04/11/2019   Current smoker 04/11/2019   Hepatitis C antibody test positive [id number]   Diastolic dysfunction 0000000   Leucopenia 01/30/2015   Thrombocytopenia ([place]) 01/30/2015   Generalized weakness 01/29/2015   Diaphoresis 01/29/2015   Essential hypertension 01/29/2015   Abnormal EKG 01/29/2015   Acute kidney injury ([place]) 01/29/2015   Hyperglycemia 01/29/2015   UTI (urinary tract infection), bacterial 01/29/2015   Difficulty walking 11/04/2013   Stiffness of joints, not elsewhere classified, multiple sites  11/04/2013   Cerebral artery occlusion with cerebral infarction ([place]) 07/15/2011   Lack of coordination 07/15/2011   Symptomatic anemia 06/04/2010    History reviewed. No pertinent surgical history.      Home Medications    Prior to Admission medications   Medication Sig Start Date End Date Taking? Authorizing Provider  aspirin EC 81 MG tablet Take 81 mg by mouth daily.   Yes [provider]  atorvastatin (LIPITOR) 80 MG tablet Take 40 mg by mouth at bedtime.   Yes [provider]  docusate sodium (COLACE) 100 MG capsule Take 100 mg by mouth daily.   Yes [provider]  hydrochlorothiazide (HYDRODIURIL) 25 MG tablet Take 25 mg by mouth daily.   Yes [provider]    Family History Family History  Problem Relation Age of Onset   Colon polyps Neg Hx     Social History Social History   Tobacco Use   Smoking status: Current Every Day Smoker    Packs/day: 1.00    Types: Cigarettes   Smokeless tobacco: Never Used  Substance Use Topics   Alcohol use: No   Drug use: No     Allergies   Patient has no known allergies.   Review of Systems Review of Systems  Constitutional: Negative for chills and fever.  HENT: Negative for sore throat and trouble swallowing.   Eyes: Negative for visual disturbance.  Respiratory: Negative for shortness of breath.   Cardiovascular: Negative for chest pain.  Gastrointestinal: Positive  for constipation. Negative for abdominal pain, diarrhea, nausea and vomiting.  Genitourinary: Negative for dysuria, flank pain and frequency.  Musculoskeletal: Negative for back pain and myalgias.  Skin: Negative for rash and wound.  Neurological: Positive for weakness. Negative for dizziness, light-headedness, numbness and headaches.  Psychiatric/Behavioral: Positive for confusion.  All other systems reviewed and are negative.    Physical Exam Updated Vital Signs BP (!) 100/45    Pulse 71    Temp 97.6 F  (36.4 C) (Oral)    Resp 18    Wt 86.2 kg    SpO2 95%    BMI 30.67 kg/m   Physical Exam Vitals signs and nursing note reviewed.  Constitutional:      General: He is not in acute distress.    Appearance: Normal appearance. He is well-developed. He is not ill-appearing.  HENT:     Head: Normocephalic and atraumatic.     Comments: No facial asymmetry    Nose: Nose normal.     Mouth/Throat:     Comments: Multiple missing teeth Eyes:     Extraocular Movements: Extraocular movements intact.     Pupils: Pupils are equal, round, and reactive to light.  Neck:     Musculoskeletal: Normal range of motion and neck supple. No neck rigidity or muscular tenderness.  Cardiovascular:     Rate and Rhythm: Normal rate and regular rhythm.     Heart sounds: No murmur. No friction rub. No gallop.   Pulmonary:     Effort: Pulmonary effort is normal. No respiratory distress.     Breath sounds: Normal breath sounds. No stridor. No wheezing, rhonchi or rales.  Chest:     Chest wall: No tenderness.  Abdominal:     General: Bowel sounds are normal. There is distension.     Palpations: Abdomen is soft. There is no mass.     Tenderness: There is no abdominal tenderness. There is no guarding or rebound.     Hernia: No hernia is present.     Comments: Very mildly distended abdomen.  Bowel sounds are hyperactive.  No tenderness to palpation.  Musculoskeletal: Normal range of motion.        General: No swelling, tenderness, deformity or signs of injury.     Right lower leg: No edema.     Left lower leg: No edema.  Lymphadenopathy:     Cervical: No cervical adenopathy.  Skin:    General: Skin is warm and dry.     Coloration: Skin is pale.     Findings: No erythema or rash.  Neurological:     Mental Status: He is alert and oriented to person, place, and time.     Comments: 3/5 motor in right upper extremity and 4/5 motor in right lower extremity.  5/5 motor in left upper and lower extremity.  Sensation to  touch is grossly intact.  Patient is oriented x2.  Does not know what year it is.  Psychiatric:        Behavior: Behavior normal.      ED Treatments / Results  Labs (all labs ordered are listed, but only abnormal results are displayed) Labs Reviewed  CBC WITH DIFFERENTIAL/PLATELET - Abnormal; Notable for the following components:      Result Value   RBC 3.07 (*)    Hemoglobin 5.6 (*)    HCT 23.2 (*)    MCV 75.6 (*)    MCH 18.2 (*)    MCHC 24.1 (*)    RDW 19.0 (*)  Lymphs Abs 0.6 (*)    All other components within normal limits  COMPREHENSIVE METABOLIC PANEL - Abnormal; Notable for the following components:   Glucose, Bld 144 (*)    Creatinine, Ser 1.42 (*)    GFR calc non Af Amer 49 (*)    GFR calc Af Amer 56 (*)    All other components within normal limits  URINALYSIS, ROUTINE W REFLEX MICROSCOPIC - Abnormal; Notable for the following components:   APPearance HAZY (*)    All other components within normal limits  POC OCCULT BLOOD, ED - Abnormal; Notable for the following components:   Fecal Occult Bld POSITIVE (*)    All other components within normal limits  SARS CORONAVIRUS 2 (HOSPITAL ORDER, [place] [facility])  ETHANOL  RAPID URINE DRUG SCREEN, HOSP PERFORMED  HEMOGLOBIN A1C  TSH  TYPE AND SCREEN  PREPARE RBC (CROSSMATCH)  ABO/RH    EKG None  Radiology Dg Abdomen 1 View  Result Date: 04/11/2019 CLINICAL DATA:  Abdominal distension. EXAM: ABDOMEN - 1 VIEW COMPARISON:  Chest and two views abdomen 08/24/2009. FINDINGS: The bowel gas pattern is normal. No radio-opaque calculi or other significant radiographic abnormality are seen. Multilevel lumbar spondylosis is identified. Convex left thoracolumbar curvature. IMPRESSION: No acute finding. Electronically Signed   By: Inge Rise M.D.   On: 04/11/2019 08:31    Procedures Procedures (including critical care time)  Medications Ordered in ED Medications  0.9 %  sodium chloride infusion  (has no administration in time range)    CRITICAL CARE Performed by: Julianne Rice Total critical care time: 25 minutes Critical care time was exclusive of separately billable procedures and treating other patients. Critical care was necessary to treat or prevent imminent or life-threatening deterioration. Critical care was time spent personally by me on the following activities: development of treatment plan with patient and/or surrogate as well as nursing, discussions with consultants, evaluation of patient's response to treatment, examination of patient, obtaining history from patient or surrogate, ordering and performing treatments and interventions, ordering and review of laboratory studies, ordering and review of radiographic studies, pulse oximetry and re-evaluation of patient's condition. Initial Impression / Assessment and Plan / ED Course  I have reviewed the triage vital signs and the nursing notes.  Pertinent labs & imaging results that were available during my care of the patient were reviewed by me and considered in my medical decision making (see chart for details).       Patient added that he was having lightheadedness when he tried to get up out of the bathtub this morning.  Noted to have a hemoglobin of 5.6.  Brown stool on rectal exam which was Hemoccult positive.  Will transfuse for symptomatic anemia.  Discussed with hospitalist who will see patient in the emergency department.  We will also consult gastroenterology  Spoke with Dr. Oneida Alar who will consult on patient. Final Clinical Impressions(s) / ED Diagnoses   Final diagnoses:  Symptomatic anemia  Gastrointestinal hemorrhage, unspecified gastrointestinal hemorrhage type    ED Discharge Orders    None       Julianne Rice, MD 04/11/19 1019

## 2019-04-12 DIAGNOSIS — K922 Gastrointestinal hemorrhage, unspecified: Secondary | ICD-10-CM

## 2019-04-12 DIAGNOSIS — I10 Essential (primary) hypertension: Secondary | ICD-10-CM

## 2019-04-12 LAB — BASIC METABOLIC PANEL
Anion gap: 7 (ref 5–15)
BUN: 13 mg/dL (ref 8–23)
CO2: 26 mmol/L (ref 22–32)
Calcium: 9.2 mg/dL (ref 8.9–10.3)
Chloride: 104 mmol/L (ref 98–111)
Creatinine, Ser: 1.12 mg/dL (ref 0.61–1.24)
GFR calc Af Amer: >60 mL/min (ref >60)
GFR calc non Af Amer: >60 mL/min (ref >60)
Glucose, Bld: 91 mg/dL (ref 70–99)
Potassium: 3.5 mmol/L (ref 3.5–5.1)
Sodium: 137 mmol/L (ref 135–145)

## 2019-04-12 LAB — CBC
HCT: 29.8 % — ABNORMAL LOW (ref 39.0–52.0)
Hemoglobin: 8.2 g/dL — ABNORMAL LOW (ref 13.0–17.0)
MCH: 21.8 pg — ABNORMAL LOW (ref 26.0–34.0)
MCHC: 27.5 g/dL — ABNORMAL LOW (ref 30.0–36.0)
MCV: 79.3 fL — ABNORMAL LOW (ref 80.0–100.0)
Platelets: 247 10*3/uL (ref 150–400)
RBC: 3.76 MIL/uL — ABNORMAL LOW (ref 4.22–5.81)
RDW: 20 % — ABNORMAL HIGH (ref 11.5–15.5)
WBC: 5.1 10*3/uL (ref 4.0–10.5)
nRBC: 0 % (ref 0.0–0.2)

## 2019-04-12 LAB — TYPE AND SCREEN
ABO/RH(D): O POS
Antibody Screen: NEGATIVE
Unit division: 0
Unit division: 0

## 2019-04-12 LAB — BPAM RBC
Blood Product Expiration Date: 202010102359
Blood Product Expiration Date: 202010102359
ISSUE DATE / TIME: 202009060955
ISSUE DATE / TIME: 202009061350
Unit Type and Rh: 5100
Unit Type and Rh: 5100

## 2019-04-12 LAB — FOLATE: Folate: 11.6 ng/mL (ref >5.9)

## 2019-04-12 LAB — MAGNESIUM: Magnesium: 1.9 mg/dL (ref 1.7–2.4)

## 2019-04-12 MED ORDER — HALOPERIDOL LACTATE 5 MG/ML IJ SOLN
1.0000 mg | Freq: Once | INTRAMUSCULAR | Status: AC
  Administered 2019-04-12: 21:00:00 1 mg via INTRAVENOUS
  Filled 2019-04-12: qty 1

## 2019-04-12 MED ORDER — PEG 3350-KCL-NA BICARB-NACL 420 G PO SOLR
4000.0000 mL | Freq: Once | ORAL | Status: AC
  Administered 2019-04-12: 4000 mL via ORAL

## 2019-04-12 NOTE — Progress Notes (Signed)
Patient is very confused and agitated. Patient constantly trying to get up by himself. Patient is very unsteady and impulsive. MD made aware.

## 2019-04-12 NOTE — Progress Notes (Signed)
  Assessment/Plan: Admitted with profound IRON DEFICIENCY ANEMIA/obscure GI BLEED. NO BRBPR OR MELENA. HISTORY OF POS HEP C Ab & REPORTS TREATMENT IN PAST.  PLAN: 1. GO-LYTETLY TODAY. 2. TAP WATER ENEMAS x2 SEP 8. 3. TCS/EGD W/ MAC SEP 8. DISCUSSED PROCEDURE, BENEFITS, & RISKS: < 1% chance of medication reaction, bleeding, perforation, ASPIRATION, or rupture of spleen/liver requiring surgery to fix it and missed polyps < 1 cm 10-20% of the time. 4. PROTONIX BID. 5. CHECK HEP C VL WITH AM LABS. 6. CALLED PT'S WIFE. LVM REGARDING PLAN. SHE CAN  CALL TO DISCUSS RESULTS.   Subjective: Since I last evaluated the patient HE HAD NO bm after MIRALAX Q1Hx4, NO BRBPR OR MELENA.  Objective: Vital signs in last 24 hours: Vitals:   04/11/19 2144 04/12/19 0531  BP: 121/66 119/65  Pulse: 68 64  Resp: 18 18  Temp: 98.2 F (36.8 C) 97.8 F (36.6 C)  SpO2: 100% 91%   General appearance: alert, cooperative and no distress Resp: clear to auscultation bilaterally Cardio: regular rate and rhythm GI: soft, non-tender; bowel sounds normal; NEURO: NO  NEW FOCAL DEFICITS  Lab Results:   FERRITIN 4 B12 2014 FOLATE 11.6 Hb 8.2 MCV 79.3   Studies/Results: NONE  Medications: I have reviewed the patient's current medications.

## 2019-04-12 NOTE — Progress Notes (Signed)
Soft wrist restraints on as ordered. Haldol given as ordered. Patient is currently resting.

## 2019-04-12 NOTE — Progress Notes (Signed)
PROGRESS NOTE    SEM SORRELLS  [id number]  DOB: October 12, 1945  DOA: 04/11/2019 PCP: Beacher May, MD   Brief Admission Hx: 73 y.o. male with cerebrovascular disease s/p old CVA with residual right hemiparesis was brought from home because wife became concerned that patient has been up all night wandering the house.  He has progressively we can having a difficult time having bowel movements.  He takes aspirin daily for secondary stroke prevention.  He was admitted with anemia Hg 5.6 and stable, chronic GI bleed.    MDM/Assessment & Plan:   1. Symptomatic anemia- secondary to likely upper GI bleeding, type and screen and transfuse 2 units PRBC and follow CBC closely.  Monitor telemetry.  Anemia panel requested. Hg improved to 8.  Pt feeling better.   2. Chronic blood loss anemia- suspect upper GI bleeding, IV Protonix 40 mg twice daily ordered, hold aspirin temporarily, GI consult requested. He is being prepped for EGD/TCS by inpatient GI service.  3. Essential hypertension-blood pressures are soft at this time holding any antihypertensives. 4. AKI- he is clinically dehydrated and we did order gentle hydration with IV normal saline 50 cc/h. 5. Hyperglycemia-no history of diabetes mellitus, add hemoglobin A1c to labs. 6. Cerebrovascular disease status post CVA- temporarily holding aspirin, continue atorvastatin. 7. Generalized weakness-likely secondary to anemia, PT evaluation will be requested after medically stabilized. 8. Tobacco-nicotine patch ordered.  Counseled on cessation.  DVT Prophylaxis: SCDs Code Status: Full Family Communication: Wife at bedside Disposition Plan: Inpatient  Consultants:  Gastroenterology  Procedures:  pending  Antimicrobials:  N/A   Subjective: Pt has been eating and drinking well.   Objective: Vitals:   04/11/19 1700 04/11/19 2144 04/12/19 0531 04/12/19 1300  BP: (!) 106/55 121/66 119/65 125/70  Pulse: 76 68 64 62  Resp: 18 18 18 18    Temp: 98.1 F (36.7 C) 98.2 F (36.8 C) 97.8 F (36.6 C) 98.5 F (36.9 C)  TempSrc: Oral Oral Oral Oral  SpO2: 99% 100% 91% 93%  Weight:      Height:        Intake/Output Summary (Last 24 hours) at 04/12/2019 1422 Last data filed at 04/12/2019 0900 Gross per 24 hour  Intake 1056 ml  Output -  Net 1056 ml   Filed Weights   04/11/19 0706 04/11/19 1044  Weight: 86.2 kg 65.7 kg     REVIEW OF SYSTEMS  As per history otherwise all reviewed and reported negative  Exam:  General exam: awake, alert, NAD cooperative.  Respiratory system: Clear. No increased work of breathing. Cardiovascular system: S1 & S2 heard. No JVD, murmurs, gallops, clicks or pedal edema. Gastrointestinal system: Abdomen is nondistended, soft and nontender. Normal bowel sounds heard. Central nervous system: Alert and oriented. No focal neurological deficits. Extremities: no CCE.  Data Reviewed: Basic Metabolic Panel: Recent Labs  Lab 04/11/19 0751 04/12/19 0423  NA 136 137  K 3.6 3.5  CL 104 104  CO2 23 26  GLUCOSE 144* 91  BUN 20 13  CREATININE 1.42* 1.12  CALCIUM 9.1 9.2  MG  --  1.9   Liver Function Tests: Recent Labs  Lab 04/11/19 0751  AST 24  ALT 15  ALKPHOS 62  BILITOT 0.4  PROT 8.0  ALBUMIN 4.1   No results for input(s): LIPASE, AMYLASE in the last 168 hours. No results for input(s): AMMONIA in the last 168 hours. CBC: Recent Labs  Lab 04/11/19 0751 04/12/19 0423  WBC 6.3 5.1  NEUTROABS 5.2  --  HGB 5.6* 8.2*  HCT 23.2* 29.8*  MCV 75.6* 79.3*  PLT 304 247   Cardiac Enzymes: No results for input(s): CKTOTAL, CKMB, CKMBINDEX, TROPONINI in the last 168 hours. CBG (last 3)  No results for input(s): GLUCAP in the last 72 hours. Recent Results (from the past 240 hour(s))  SARS Coronavirus 2 [facility] order, Performed in [facility] hospital lab) Nasopharyngeal Nasopharyngeal Swab     Status: None   Collection Time: 04/11/19  8:39 AM   Specimen: Nasopharyngeal Swab   Result Value Ref Range Status   SARS Coronavirus 2 NEGATIVE NEGATIVE Final    Comment: (NOTE) If result is NEGATIVE SARS-CoV-2 target nucleic acids are NOT DETECTED. The SARS-CoV-2 RNA is generally detectable in upper and lower  respiratory specimens during the acute phase of infection. The lowest  concentration of SARS-CoV-2 viral copies this assay can detect is 250  copies / mL. A negative result does not preclude SARS-CoV-2 infection  and should not be used as the sole basis for treatment or other  patient management decisions.  A negative result may occur with  improper specimen collection / handling, submission of specimen other  than nasopharyngeal swab, presence of viral mutation(s) within the  areas targeted by this assay, and inadequate number of viral copies  (<250 copies / mL). A negative result must be combined with clinical  observations, patient history, and epidemiological information. If result is POSITIVE SARS-CoV-2 target nucleic acids are DETECTED. The SARS-CoV-2 RNA is generally detectable in upper and lower  respiratory specimens dur ing the acute phase of infection.  Positive  results are indicative of active infection with SARS-CoV-2.  Clinical  correlation with patient history and other diagnostic information is  necessary to determine patient infection status.  Positive results do  not rule out bacterial infection or co-infection with other viruses. If result is PRESUMPTIVE POSTIVE SARS-CoV-2 nucleic acids MAY BE PRESENT.   A presumptive positive result was obtained on the submitted specimen  and confirmed on repeat testing.  While 2019 novel coronavirus  (SARS-CoV-2) nucleic acids may be present in the submitted sample  additional confirmatory testing may be necessary for epidemiological  and / or clinical management purposes  to differentiate between  SARS-CoV-2 and other Sarbecovirus currently known to infect humans.  If clinically indicated additional  testing with an alternate test  methodology [phone]) is advised. The SARS-CoV-2 RNA is generally  detectable in upper and lower respiratory sp ecimens during the acute  phase of infection. The expected result is Negative. Fact Sheet for Patients:  StrictlyIdeas.no Fact Sheet for Healthcare Providers: BankingDealers.co.za This test is not yet approved or cleared by the [place] FDA and has been authorized for detection and/or diagnosis of SARS-CoV-2 by FDA under an Emergency Use Authorization (EUA).  This EUA will remain in effect (meaning this test can be used) for the duration of the COVID-19 declaration under Section 564(b)(1) of the Act, 21 U.S.C. section 360bbb-3(b)(1), unless the authorization is terminated or revoked sooner. Performed at [facility], [address] [place], [place] [address]      Studies: Dg Abdomen 1 View  Result Date: 04/11/2019 CLINICAL DATA:  Abdominal distension. EXAM: ABDOMEN - 1 VIEW COMPARISON:  Chest and two views abdomen 08/24/2009. FINDINGS: The bowel gas pattern is normal. No radio-opaque calculi or other significant radiographic abnormality are seen. Multilevel lumbar spondylosis is identified. Convex left thoracolumbar curvature. IMPRESSION: No acute finding. Electronically Signed   By: Inge Rise M.D.   On: 04/11/2019 08:31  Scheduled Meds: . atorvastatin  40 mg Oral QHS  . pantoprazole (PROTONIX) IV  40 mg Intravenous Q12H   Continuous Infusions: . sodium chloride    . sodium chloride 50 mL/hr at 04/12/19 1301    Principal Problem:   Symptomatic anemia Active Problems:   Heme positive stool   Lack of coordination   Difficulty walking   Generalized weakness   Essential hypertension   Acute kidney injury ([place])   Hyperglycemia   Diastolic dysfunction   Orthostatic hypotension   Current smoker  Time spent:   Irwin Brakeman, MD [facility] 04/12/2019, 2:22 PM     LOS: 1 day  How to contact the [facility] Attending or Consulting provider [place] or covering provider during after hours [place], for this patient?  1. Check the care team in [facility] and look for a) attending/consulting [facility] provider listed and b) the [facility] team listed 2. Log into [email or web address] and use [place]'s universal password to access. If you do not have the password, please contact the hospital operator. 3. Locate the [facility] provider you are looking for under [facility] and page to a number that you can be directly reached. 4. If you still have difficulty reaching the provider, please page the [facility] (Director on Call) for the Hospitalists listed on amion for assistance.

## 2019-04-12 NOTE — H&P (View-Only) (Signed)
  Assessment/Plan: Admitted with profound IRON DEFICIENCY ANEMIA/obscure GI BLEED. NO BRBPR OR MELENA. HISTORY OF POS HEP C Ab & REPORTS TREATMENT IN PAST.  PLAN: 1. GO-LYTETLY TODAY. 2. TAP WATER ENEMAS x2 SEP 8. 3. TCS/EGD W/ MAC SEP 8. DISCUSSED PROCEDURE, BENEFITS, & RISKS: < 1% chance of medication reaction, bleeding, perforation, ASPIRATION, or rupture of spleen/liver requiring surgery to fix it and missed polyps < 1 cm 10-20% of the time. 4. PROTONIX BID. 5. CHECK HEP C VL WITH AM LABS. 6. CALLED PT'S WIFE. LVM REGARDING PLAN. SHE CAN  CALL TO DISCUSS RESULTS.   Subjective: Since I last evaluated the patient HE HAD NO bm after MIRALAX Q1Hx4, NO BRBPR OR MELENA.  Objective: Vital signs in last 24 hours: Vitals:   04/11/19 2144 04/12/19 0531  BP: 121/66 119/65  Pulse: 68 64  Resp: 18 18  Temp: 98.2 F (36.8 C) 97.8 F (36.6 C)  SpO2: 100% 91%   General appearance: alert, cooperative and no distress Resp: clear to auscultation bilaterally Cardio: regular rate and rhythm GI: soft, non-tender; bowel sounds normal; NEURO: NO  NEW FOCAL DEFICITS  Lab Results:   FERRITIN 4 B12 2014 FOLATE 11.6 Hb 8.2 MCV 79.3   Studies/Results: NONE  Medications: I have reviewed the patient's current medications.

## 2019-04-13 ENCOUNTER — Encounter (HOSPITAL_COMMUNITY)
Admission: EM | Disposition: A | Discharge status: Skilled Nursing Facility | Payer: Self-pay | Source: Home / Self Care | Attending: Internal Medicine

## 2019-04-13 ENCOUNTER — Inpatient Hospital Stay (HOSPITAL_COMMUNITY): Payer: Medicare Other | Admitting: Anesthesiology

## 2019-04-13 ENCOUNTER — Encounter (HOSPITAL_COMMUNITY): Payer: Self-pay

## 2019-04-13 DIAGNOSIS — K2970 Gastritis, unspecified, without bleeding: Secondary | ICD-10-CM

## 2019-04-13 DIAGNOSIS — K222 Esophageal obstruction: Secondary | ICD-10-CM

## 2019-04-13 HISTORY — PX: ESOPHAGOGASTRODUODENOSCOPY (EGD) WITH PROPOFOL: SHX5813

## 2019-04-13 LAB — BASIC METABOLIC PANEL
Anion gap: 7 (ref 5–15)
BUN: 11 mg/dL (ref 8–23)
CO2: 24 mmol/L (ref 22–32)
Calcium: 9 mg/dL (ref 8.9–10.3)
Chloride: 109 mmol/L (ref 98–111)
Creatinine, Ser: 0.98 mg/dL (ref 0.61–1.24)
GFR calc Af Amer: >60 mL/min (ref >60)
GFR calc non Af Amer: >60 mL/min (ref >60)
Glucose, Bld: 86 mg/dL (ref 70–99)
Potassium: 3.8 mmol/L (ref 3.5–5.1)
Sodium: 140 mmol/L (ref 135–145)

## 2019-04-13 LAB — CBC
HCT: 31.6 % — ABNORMAL LOW (ref 39.0–52.0)
Hemoglobin: 8.4 g/dL — ABNORMAL LOW (ref 13.0–17.0)
MCH: 20.9 pg — ABNORMAL LOW (ref 26.0–34.0)
MCHC: 26.6 g/dL — ABNORMAL LOW (ref 30.0–36.0)
MCV: 78.8 fL — ABNORMAL LOW (ref 80.0–100.0)
Platelets: 227 10*3/uL (ref 150–400)
RBC: 4.01 MIL/uL — ABNORMAL LOW (ref 4.22–5.81)
RDW: 20.6 % — ABNORMAL HIGH (ref 11.5–15.5)
WBC: 4.3 10*3/uL (ref 4.0–10.5)
nRBC: 0 % (ref 0.0–0.2)

## 2019-04-13 LAB — MAGNESIUM: Magnesium: 2 mg/dL (ref 1.7–2.4)

## 2019-04-13 SURGERY — COLONOSCOPY WITH PROPOFOL
Anesthesia: Monitor Anesthesia Care

## 2019-04-13 SURGERY — ESOPHAGOGASTRODUODENOSCOPY (EGD) WITH PROPOFOL
Anesthesia: General

## 2019-04-13 MED ORDER — HYDROCODONE-ACETAMINOPHEN 7.5-325 MG PO TABS: 1.0000 | ORAL_TABLET | Freq: Once | ORAL | Status: DC | PRN

## 2019-04-13 MED ORDER — HYDROMORPHONE HCL 1 MG/ML IJ SOLN: 0.2500 mg | INTRAMUSCULAR | Status: DC | PRN

## 2019-04-13 MED ORDER — LACTATED RINGERS IV SOLN
INTRAVENOUS | Status: DC
  Administered 2019-04-13 (×2): via INTRAVENOUS

## 2019-04-13 MED ORDER — PROMETHAZINE HCL 25 MG/ML IJ SOLN: 6.2500 mg | INTRAMUSCULAR | Status: DC | PRN

## 2019-04-13 MED ORDER — PANTOPRAZOLE SODIUM 40 MG PO TBEC
40.0000 mg | DELAYED_RELEASE_TABLET | Freq: Every day | ORAL | Status: DC
  Administered 2019-04-15 – 2019-04-16 (×2): 40 mg via ORAL
  Filled 2019-04-13 (×3): qty 1

## 2019-04-13 MED ORDER — KETAMINE HCL 50 MG/5ML IJ SOSY
PREFILLED_SYRINGE | INTRAMUSCULAR | Status: AC
  Filled 2019-04-13: qty 5

## 2019-04-13 MED ORDER — PROPOFOL 500 MG/50ML IV EMUL
INTRAVENOUS | Status: DC | PRN
  Administered 2019-04-13: 150 ug/kg/min via INTRAVENOUS

## 2019-04-13 MED ORDER — SUCCINYLCHOLINE CHLORIDE 20 MG/ML IJ SOLN
INTRAMUSCULAR | Status: AC
  Filled 2019-04-13: qty 1

## 2019-04-13 MED ORDER — PEG 3350-KCL-NA BICARB-NACL 420 G PO SOLR
4000.0000 mL | Freq: Once | ORAL | Status: AC
  Administered 2019-04-13: 4000 mL via ORAL

## 2019-04-13 MED ORDER — MIDAZOLAM HCL 2 MG/2ML IJ SOLN: 0.5000 mg | Freq: Once | INTRAMUSCULAR | Status: DC | PRN

## 2019-04-13 MED ORDER — PROPOFOL 10 MG/ML IV BOLUS
INTRAVENOUS | Status: DC | PRN
  Administered 2019-04-13 (×2): 20 mg via INTRAVENOUS

## 2019-04-13 NOTE — Progress Notes (Signed)
Late entry: patient not in restraints. Not attempting to get OOB. Calm & resting, Cooperative.

## 2019-04-13 NOTE — Anesthesia Postprocedure Evaluation (Signed)
Anesthesia Post Note  Patient: Eric [name]  Procedure(s) Performed: ESOPHAGOGASTRODUODENOSCOPY (EGD) WITH PROPOFOL (N/A )  Patient location during evaluation: PACU Anesthesia Type: General Level of consciousness: sedated and patient cooperative Pain management: pain level controlled Vital Signs Assessment: post-procedure vital signs reviewed and stable Respiratory status: spontaneous breathing Cardiovascular status: blood pressure returned to baseline and stable Postop Assessment: no apparent nausea or vomiting Anesthetic complications: no     Last Vitals:  Vitals:   04/13/19 1225 04/13/19 1630  BP: 115/68 (!) 87/43  Pulse: 60   Resp:    Temp: 36.8 C 36.6 C  SpO2:  98%    Last Pain:  Vitals:   04/13/19 1630  TempSrc:   PainSc: 0-No pain                 Ezechiel [place]

## 2019-04-13 NOTE — Progress Notes (Signed)
PROGRESS NOTE    Eric [name]  [id number]  DOB: 04-08-1946  DOA: 04/11/2019 PCP: Beacher May, MD   Brief Admission Hx: 73 y.o. male with cerebrovascular disease s/p old CVA with residual right hemiparesis was brought from home because wife became concerned that patient has been up all night wandering the house.  He has progressively we can having a difficult time having bowel movements.  He takes aspirin daily for secondary stroke prevention.  He was admitted with anemia Hg 5.6 and stable, chronic GI bleed.    MDM/Assessment & Plan:   1. Symptomatic anemia- secondary to likely upper GI bleeding, type and screen and transfused 2 units PRBC and Hg stable at 8.  Monitor telemetry.     2. Chronic blood loss anemia- suspect upper GI bleeding, IV Protonix 40 mg twice daily ordered, hold aspirin temporarily, GI following. He is being prepped for EGD/TCS by inpatient GI service for 04/13/19.  Further recommendations to follow.  3. Essential hypertension-blood pressures are soft at this time holding any antihypertensives. 4. AKI- RESOLVED. He was clinically dehydrated and responded well to gentle hydration with IV normal saline 50 cc/h. 5. Hyperglycemia-no history of diabetes mellitus, add hemoglobin A1c to labs. 6. Cerebrovascular disease status post CVA- temporarily holding aspirin, continue atorvastatin. 7. Generalized weakness-likely secondary to anemia, PT evaluation will be requested after medically stabilized. 8. Tobacco-nicotine patch ordered.  Counseled on cessation.  DVT Prophylaxis: SCDs Code Status: Full Family Communication: Wife at bedside Disposition Plan: Inpatient  Consultants:  Gastroenterology  Procedures:  TCS/EGD planned 04/13/19  Antimicrobials:  N/A   Subjective: Pt has having intermittent sundowning and confusion.    Objective: Vitals:   04/12/19 0531 04/12/19 1300 04/13/19 0535 04/13/19 1225  BP: 119/65 125/70 (!) 146/66 115/68  Pulse: 64 62 (!)  59 60  Resp: 18 18 16    Temp: 97.8 F (36.6 C) 98.5 F (36.9 C)  98.2 F (36.8 C)  TempSrc: Oral Oral  Oral  SpO2: 91% 93% 99%   Weight:      Height:        Intake/Output Summary (Last 24 hours) at 04/13/2019 1246 Last data filed at 04/13/2019 [id number] Gross per 24 hour  Intake 1359.58 ml  Output 550 ml  Net 809.58 ml   Filed Weights   04/11/19 0706 04/11/19 1044  Weight: 86.2 kg 65.7 kg   REVIEW OF SYSTEMS  As per history otherwise all reviewed and reported negative  Exam:  General exam: awake, alert, NAD cooperative.  Respiratory system: Clear. No increased work of breathing. Cardiovascular system: S1 & S2 heard. No JVD, murmurs, gallops, clicks or pedal edema. Gastrointestinal system: Abdomen is nondistended, soft and nontender. Normal bowel sounds heard. Central nervous system: Alert and oriented. No focal neurological deficits. Extremities: no CCE.  Data Reviewed: Basic Metabolic Panel: Recent Labs  Lab 04/11/19 0751 04/12/19 0423 04/13/19 0451  NA 136 137 140  K 3.6 3.5 3.8  CL 104 104 109  CO2 23 26 24   GLUCOSE 144* 91 86  BUN 20 13 11   CREATININE 1.42* 1.12 0.98  CALCIUM 9.1 9.2 9.0  MG  --  1.9 2.0   Liver Function Tests: Recent Labs  Lab 04/11/19 0751  AST 24  ALT 15  ALKPHOS 62  BILITOT 0.4  PROT 8.0  ALBUMIN 4.1   No results for input(s): LIPASE, AMYLASE in the last 168 hours. No results for input(s): AMMONIA in the last 168 hours. CBC: Recent Labs  Lab 04/11/19 0751 04/12/19  0423 04/13/19 0451  WBC 6.3 5.1 4.3  NEUTROABS 5.2  --   --   HGB 5.6* 8.2* 8.4*  HCT 23.2* 29.8* 31.6*  MCV 75.6* 79.3* 78.8*  PLT 304 247 227   Cardiac Enzymes: No results for input(s): CKTOTAL, CKMB, CKMBINDEX, TROPONINI in the last 168 hours. CBG (last 3)  No results for input(s): GLUCAP in the last 72 hours. Recent Results (from the past 240 hour(s))  SARS Coronavirus 2 [facility] order, Performed in [facility] hospital lab) Nasopharyngeal  Nasopharyngeal Swab     Status: None   Collection Time: 04/11/19  8:39 AM   Specimen: Nasopharyngeal Swab  Result Value Ref Range Status   SARS Coronavirus 2 NEGATIVE NEGATIVE Final    Comment: (NOTE) If result is NEGATIVE SARS-CoV-2 target nucleic acids are NOT DETECTED. The SARS-CoV-2 RNA is generally detectable in upper and lower  respiratory specimens during the acute phase of infection. The lowest  concentration of SARS-CoV-2 viral copies this assay can detect is 250  copies / mL. A negative result does not preclude SARS-CoV-2 infection  and should not be used as the sole basis for treatment or other  patient management decisions.  A negative result may occur with  improper specimen collection / handling, submission of specimen other  than nasopharyngeal swab, presence of viral mutation(s) within the  areas targeted by this assay, and inadequate number of viral copies  (<250 copies / mL). A negative result must be combined with clinical  observations, patient history, and epidemiological information. If result is POSITIVE SARS-CoV-2 target nucleic acids are DETECTED. The SARS-CoV-2 RNA is generally detectable in upper and lower  respiratory specimens dur ing the acute phase of infection.  Positive  results are indicative of active infection with SARS-CoV-2.  Clinical  correlation with patient history and other diagnostic information is  necessary to determine patient infection status.  Positive results do  not rule out bacterial infection or co-infection with other viruses. If result is PRESUMPTIVE POSTIVE SARS-CoV-2 nucleic acids MAY BE PRESENT.   A presumptive positive result was obtained on the submitted specimen  and confirmed on repeat testing.  While 2019 novel coronavirus  (SARS-CoV-2) nucleic acids may be present in the submitted sample  additional confirmatory testing may be necessary for epidemiological  and / or clinical management purposes  to differentiate between   SARS-CoV-2 and other Sarbecovirus currently known to infect humans.  If clinically indicated additional testing with an alternate test  methodology [phone]) is advised. The SARS-CoV-2 RNA is generally  detectable in upper and lower respiratory sp ecimens during the acute  phase of infection. The expected result is Negative. Fact Sheet for Patients:  StrictlyIdeas.no Fact Sheet for Healthcare Providers: BankingDealers.co.za This test is not yet approved or cleared by the [place] FDA and has been authorized for detection and/or diagnosis of SARS-CoV-2 by FDA under an Emergency Use Authorization (EUA).  This EUA will remain in effect (meaning this test can be used) for the duration of the COVID-19 declaration under Section 564(b)(1) of the Act, 21 U.S.C. section 360bbb-3(b)(1), unless the authorization is terminated or revoked sooner. Performed at [facility], [address], [place], [place] [address]      Studies: No results found. Scheduled Meds: . [MAR Hold] atorvastatin  40 mg Oral QHS  . [MAR Hold] pantoprazole (PROTONIX) IV  40 mg Intravenous Q12H   Continuous Infusions: . [MAR Hold] sodium chloride    . sodium chloride 50 mL/hr at 04/12/19 1301  .  lactated ringers 50 mL/hr at 04/13/19 1224    Principal Problem:   Symptomatic anemia Active Problems:   Heme positive stool   Lack of coordination   Difficulty walking   Generalized weakness   Essential hypertension   Acute kidney injury ([place])   Hyperglycemia   Diastolic dysfunction   Orthostatic hypotension   Current smoker   Gastrointestinal hemorrhage  Time spent:   Irwin Brakeman, MD [facility] 04/13/2019, 12:46 PM    LOS: 2 days  How to contact the [facility] Attending or Consulting provider [place] or covering provider during after hours [place], for this patient?  1. Check the care team in [facility] and look for a) attending/consulting [facility] provider listed  and b) the [facility] team listed 2. Log into [email or web address] and use [place]'s universal password to access. If you do not have the password, please contact the hospital operator. 3. Locate the [facility] provider you are looking for under [facility] and page to a number that you can be directly reached. 4. If you still have difficulty reaching the provider, please page the [facility] (Director on Call) for the Hospitalists listed on amion for assistance.

## 2019-04-13 NOTE — Plan of Care (Signed)
PT HAD POOR PREP. NEED ADDITIONAL GO-LYTE AND ENEMAS. WILL ATTEMPT TCS SEP 8 WITH MODERATE SEDATION. CALLED WIFE TO DISCUSS. LVM THAT SHE CAN CALL TO DISCUSS.

## 2019-04-13 NOTE — Progress Notes (Signed)
Patient had 2 loose brown stools. Remain unclear. Golytely completed.

## 2019-04-13 NOTE — Interval H&P Note (Signed)
History and Physical Interval Note:  04/13/2019 4:05 PM  Eric [name]  has presented today for surgery, with the diagnosis of iron deficiency anemia.  The various methods of treatment have been discussed with the patient and family. After consideration of risks, benefits and other options for treatment, the patient has consented to  Procedure(s): COLONOSCOPY WITH PROPOFOL (N/A) ESOPHAGOGASTRODUODENOSCOPY (EGD) WITH PROPOFOL (N/A) as a surgical intervention.  The patient's history has been reviewed, patient examined, no change in status, stable for surgery.  I have reviewed the patient's chart and labs.  Questions were answered to the patient's satisfaction.     [employer]

## 2019-04-13 NOTE — Progress Notes (Signed)
Late entry: Patient resting & calm, confused. No longer in restraints. No pulling at IV's. Cooperative. Will cont. To monitor throughout shift.

## 2019-04-13 NOTE — Anesthesia Preprocedure Evaluation (Signed)
Anesthesia Evaluation  Patient identified by MRN, date of birth, ID band Patient awake    Reviewed: Allergy & Precautions, NPO status , Patient's Chart, lab work & pertinent test results  Airway Mallampati: II  TM Distance: >3 FB Neck ROM: Full    Dental no notable dental hx. (+) Edentulous Upper   Pulmonary neg pulmonary ROS, Current Smoker and Patient abstained from smoking.,    Pulmonary exam normal breath sounds clear to auscultation       Cardiovascular Exercise Tolerance: Poor hypertension, Pt. on medications negative cardio ROS Normal cardiovascular examII Rhythm:Regular Rate:Normal  Gets around but only inside the house  No regular walking outside ECG PVCs, LAFB, RBBB   Neuro/Psych Pt confused  H/o obtained by phone from his wife, who also gave consent  CVA, Residual Symptoms negative psych ROS   GI/Hepatic negative GI ROS, Neg liver ROS,   Endo/Other  negative endocrine ROS  Renal/GU Renal InsufficiencyRenal disease  negative genitourinary   Musculoskeletal  (+) Arthritis , Osteoarthritis,    Abdominal   Peds negative pediatric ROS (+)  Hematology negative hematology ROS (+) anemia ,   Anesthesia Other Findings   Reproductive/Obstetrics negative OB ROS                             Anesthesia Physical Anesthesia Plan  ASA: IV  Anesthesia Plan: General   Post-op Pain Management:    Induction: Intravenous  PONV Risk Score and Plan: 1 and TIVA, Propofol infusion and Treatment may vary due to age or medical condition  Airway Management Planned: Nasal Cannula and Simple Face Mask  Additional Equipment:   Intra-op Plan:   Post-operative Plan:   Informed Consent: I have reviewed the patients History and Physical, chart, labs and discussed the procedure including the risks, benefits and alternatives for the proposed anesthesia with the patient or authorized representative  who has indicated his/her understanding and acceptance.     Dental advisory given  Plan Discussed with: CRNA  Anesthesia Plan Comments: (Plan Full PPE use  Plan GA with GETA as needed d/w pt and wife -WTP with same after Q&A Also d/w wife possibility of post procedure ventilation by phone -WTP)        Anesthesia Quick Evaluation

## 2019-04-13 NOTE — Progress Notes (Signed)
Patient's wife notified of patient being placed in soft wrist restraints due to pulling at tubes, trying to get out of bed and unable to redirected. Wife stated she understands the requirements for soft wrist restraints, also explained requirements for restraints removal.  Wife's questions answered, no further comments at this time.

## 2019-04-13 NOTE — Op Note (Signed)
[facility] Patient Name: Eric [name] Procedure Date: 04/13/2019 4:18 PM MRN: [id number] Date of Birth: Sep 28, 1945 Attending MD: Barney Drain MD, MD CSN: [id number] Age: 73 Admit Type: Inpatient Procedure:                Upper GI endoscopy WITH COLD FORCEPS BIOPSY Indications:              Iron deficiency anemia Providers:                Barney Drain MD, MD, Janeece Riggers, RN, Bonnetta Barry,                            Technician Referring MD:             Beacher May, MD Medicines:                Propofol per Anesthesia Complications:            No immediate complications. Estimated Blood Loss:     Estimated blood loss was minimal. Procedure:                Pre-Anesthesia Assessment:                           - Prior to the procedure, a History and Physical                            was performed, and patient medications and                            allergies were reviewed. The patient's tolerance of                            previous anesthesia was also reviewed. The risks                            and benefits of the procedure and the sedation                            options and risks were discussed with the patient.                            All questions were answered, and informed consent                            was obtained. Prior Anticoagulants: The patient has                            taken no previous anticoagulant or antiplatelet                            agents. ASA Grade Assessment: III - A patient with                            severe systemic disease. After reviewing the risks  and benefits, the patient was deemed in                            satisfactory condition to undergo the procedure.                            After obtaining informed consent, the endoscope was                            passed under direct vision. Throughout the                            procedure, the patient's blood pressure, pulse, and                     oxygen saturations were monitored continuously. The                            [id number]) was introduced through the                            mouth, and advanced to the second part of duodenum.                            The upper GI endoscopy was accomplished without                            difficulty. The patient tolerated the procedure                            well. Scope In: 4:21:20 PM Scope Out: 4:28:22 PM Total Procedure Duration: 0 hours 7 minutes 2 seconds  Findings:      One benign-appearing, intrinsic moderate (circumferential scarring or       stenosis; an endoscope may pass) stenosis was found. This stenosis       measured 1.4 cm (inner diameter). The stenosis was traversed.      A medium-sized hiatal hernia was present.      Segmental mild inflammation characterized by congestion (edema) and       erythema was found on the greater curvature of the stomach and in the       gastric antrum. Biopsies(2:BODY, 1:INCISURA, 2:ANTRUM) were taken with a       cold forceps for Helicobacter pylori testing.      The duodenal bulb was normal. Biopsies(2) for histology were taken with       a cold forceps for evaluation of celiac disease.      The second portion of the duodenum was normal. Biopsies(4) for histology       were taken with a cold forceps for evaluation of celiac disease. Impression:               - Benign-appearing esophageal STRICTURE.                           - Medium-sized hiatal hernia.                           -  MILD Gastritis. Biopsied. Moderate Sedation:      Per Anesthesia Care Recommendation:           - Clear liquid diet.                           - Continue present medications.                           - Await pathology results.                           - Return patient to hospital ward for ongoing care. Procedure Code(s):        --- Professional ---                           [id number], Esophagogastroduodenoscopy, flexible,                             transoral; with biopsy, single or multiple Diagnosis Code(s):        --- Professional ---                           K22.2, Esophageal obstruction                           K44.9, Diaphragmatic hernia without obstruction or                            gangrene                           K29.70, Gastritis, unspecified, without bleeding                           D50.9, Iron deficiency anemia, unspecified CPT copyright 2019 American Medical Association. All rights reserved. The codes documented in this report are preliminary and upon coder review may  be revised to meet current compliance requirements. Barney Drain, MD Barney Drain MD, MD 04/13/2019 4:48:46 PM This report has been signed electronically. Number of Addenda: 0

## 2019-04-13 NOTE — Progress Notes (Signed)
Patient impulsive and confused. Unable to be redirected at this time. Patient pulling tubes and agitated. Patient unable to follow directions at this time. Patient continually trying to get up and states" I am going home".

## 2019-04-13 NOTE — Transfer of Care (Signed)
Immediate Anesthesia Transfer of Care Note  Patient: Eric [name]  Procedure(s) Performed: ESOPHAGOGASTRODUODENOSCOPY (EGD) WITH PROPOFOL (N/A )  Patient Location: PACU  Anesthesia Type:General  Level of Consciousness: awake  Airway & Oxygen Therapy: Patient Spontanous Breathing  Post-op Assessment: Report given to RN  Post vital signs: Reviewed and stable  Last Vitals:  Vitals Value Taken Time  BP    Temp    Pulse    Resp    SpO2      Last Pain:  Vitals:   04/13/19 1630  TempSrc:   PainSc: (P) 0-No pain         Complications: No apparent anesthesia complications

## 2019-04-14 ENCOUNTER — Encounter (HOSPITAL_COMMUNITY)
Admission: EM | Disposition: A | Discharge status: Skilled Nursing Facility | Payer: Self-pay | Source: Home / Self Care | Attending: Internal Medicine

## 2019-04-14 ENCOUNTER — Encounter (HOSPITAL_COMMUNITY): Payer: Self-pay | Admitting: *Deleted

## 2019-04-14 HISTORY — PX: IMPACTION REMOVAL: SHX5858

## 2019-04-14 LAB — BASIC METABOLIC PANEL
Anion gap: 8 (ref 5–15)
BUN: 11 mg/dL (ref 8–23)
CO2: 21 mmol/L — ABNORMAL LOW (ref 22–32)
Calcium: 8.6 mg/dL — ABNORMAL LOW (ref 8.9–10.3)
Chloride: 107 mmol/L (ref 98–111)
Creatinine, Ser: 0.86 mg/dL (ref 0.61–1.24)
GFR calc Af Amer: >60 mL/min (ref >60)
GFR calc non Af Amer: >60 mL/min (ref >60)
Glucose, Bld: 80 mg/dL (ref 70–99)
Potassium: 3.3 mmol/L — ABNORMAL LOW (ref 3.5–5.1)
Sodium: 136 mmol/L (ref 135–145)

## 2019-04-14 LAB — HCV RNA QUANT: HCV Quantitative: NOT DETECTED IU/mL (ref >50)

## 2019-04-14 LAB — CBC
HCT: 31.5 % — ABNORMAL LOW (ref 39.0–52.0)
Hemoglobin: 8.3 g/dL — ABNORMAL LOW (ref 13.0–17.0)
MCH: 21.2 pg — ABNORMAL LOW (ref 26.0–34.0)
MCHC: 26.3 g/dL — ABNORMAL LOW (ref 30.0–36.0)
MCV: 80.4 fL (ref 80.0–100.0)
Platelets: 220 10*3/uL (ref 150–400)
RBC: 3.92 MIL/uL — ABNORMAL LOW (ref 4.22–5.81)
RDW: 21.2 % — ABNORMAL HIGH (ref 11.5–15.5)
WBC: 5.8 10*3/uL (ref 4.0–10.5)
nRBC: 0 % (ref 0.0–0.2)

## 2019-04-14 LAB — MAGNESIUM: Magnesium: 1.9 mg/dL (ref 1.7–2.4)

## 2019-04-14 SURGERY — IMPACTION REMOVAL

## 2019-04-14 MED ORDER — SODIUM CHLORIDE 0.9 % IV SOLN
INTRAVENOUS | Status: DC
  Administered 2019-04-14: 15:00:00 1000 mL via INTRAVENOUS

## 2019-04-14 MED ORDER — POTASSIUM CHLORIDE IN NACL 40-0.9 MEQ/L-% IV SOLN
INTRAVENOUS | Status: DC
  Administered 2019-04-14 – 2019-04-15 (×2): 50 mL/h via INTRAVENOUS

## 2019-04-14 MED ORDER — MIDAZOLAM HCL 5 MG/5ML IJ SOLN
INTRAMUSCULAR | Status: AC
  Filled 2019-04-14: qty 10

## 2019-04-14 MED ORDER — MAGNESIUM CITRATE PO SOLN
1.5000 | Freq: Once | ORAL | Status: AC
  Administered 2019-04-15: 1.5 via ORAL

## 2019-04-14 MED ORDER — MAGNESIUM CITRATE PO SOLN
1.5000 | Freq: Once | ORAL | Status: AC
  Administered 2019-04-14: 1.5 via ORAL
  Filled 2019-04-14: qty 592

## 2019-04-14 MED ORDER — MIDAZOLAM HCL 5 MG/5ML IJ SOLN
INTRAMUSCULAR | Status: DC | PRN
  Administered 2019-04-14 (×3): 1 mg via INTRAVENOUS

## 2019-04-14 MED ORDER — MEPERIDINE HCL 100 MG/ML IJ SOLN
INTRAMUSCULAR | Status: AC
  Filled 2019-04-14: qty 2

## 2019-04-14 MED ORDER — MEPERIDINE HCL 100 MG/ML IJ SOLN
INTRAMUSCULAR | Status: DC | PRN
  Administered 2019-04-14: 25 mg

## 2019-04-14 MED ORDER — ZOLPIDEM TARTRATE 5 MG PO TABS: 5.0000 mg | ORAL_TABLET | Freq: Once | ORAL | Status: DC

## 2019-04-14 MED ORDER — POTASSIUM CHLORIDE CRYS ER 20 MEQ PO TBCR
40.0000 meq | EXTENDED_RELEASE_TABLET | Freq: Two times a day (BID) | ORAL | Status: AC
  Administered 2019-04-14: 40 meq via ORAL
  Filled 2019-04-14 (×2): qty 2

## 2019-04-14 NOTE — Progress Notes (Signed)
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 He has progressively we can having a difficult time having bowel movements.He takes aspirin daily for secondary stroke prevention.  He was admitted with anemia Hg 5.6 and stable, chronic GI bleed.    9/9: GI plans for colonoscopy today.  PT recommending SNF/rehab that patient insisting on possible home with home health physical therapy.  Assessment & Plan:   Principal Problem:   Symptomatic anemia Active Problems:   Lack of coordination   Difficulty walking   Generalized weakness   Essential hypertension   Acute kidney injury (HCC)   Hyperglycemia   Diastolic dysfunction   Heme positive stool   Orthostatic hypotension   Current smoker   Gastrointestinal hemorrhage   Acute symptomatic blood loss anemia likely secondary to GI bleed -EGD on 9/8 only reflects mild gastritis with no other stigmata of active bleeding noted -Patient status post 2 unit PRBCs with hemoglobin stable at 8 -Protonix IV twice daily -Colonoscopy per GI today  Essential hypertension -Resume home blood pressure medications as blood pressure is beginning to elevate  AKI-resolved -Continue to monitor repeat a.m. labs  Hyperglycemia-resolved -Continue to monitor -No history of DM  Generalized weakness -PT recommending SNF rehab  History of CVA -Holding aspirin for now -Continue atorvastatin  Tobacco abuse -Counseled on cessation -Nicotine patch  DVT prophylaxis: SCDs Code Status: Full Family Communication: None at bedside Disposition Plan: Per GI with plans for colonoscopy today   Consultants:   GI  Procedures:   EGD 9/8  Antimicrobials:   None   Subjective:  Patient seen and evaluated today with no new acute complaints or concerns. No acute concerns or events noted overnight.  Patient has completed GoLYTELY yesterday evening and will receive 2 further tapwater enemas prior to colonoscopy planned for this afternoon.  Objective: Vitals:   04/14/19 0626 04/14/19 1411 04/14/19 1440 04/14/19 1450  BP: 139/73 (!) 156/73    Pulse: 65 73 64 68  Resp: 16 14 (!) 33 15  Temp: 98.1 F (36.7 C) 98.6 F (37 C)    TempSrc: Oral Axillary    SpO2: 100% 100% 100% 100%  Weight:  63.5 kg    Height:  5\' 5"  (1.651 m)      Intake/Output Summary (Last 24 hours) at 04/14/2019 1455 Last data filed at 04/14/2019 0900 Gross per 24 hour  Intake 500 ml  Output 0 ml  Net 500 ml   Filed Weights   04/11/19 0706 04/11/19 1044 04/14/19 1411  Weight: 86.2 kg 65.7 kg 63.5 kg    Examination:  General exam: Appears calm and comfortable  Respiratory system: Clear to auscultation. Respiratory effort normal. Cardiovascular system: S1 & S2 heard, RRR. No JVD, murmurs, rubs, gallops or clicks. No pedal edema. Gastrointestinal system: Abdomen is nondistended, soft and nontender. No organomegaly or masses felt. Normal bowel sounds heard. Central nervous system: Alert and awake Extremities: Symmetric 5 x 5 power. Skin: No rashes, lesions or ulcers Psychiatry: Flat affect    Data Reviewed: I have personally reviewed following labs and imaging studies  CBC: Recent Labs  Lab 04/11/19 0751 04/12/19 0423 04/13/19 0451 04/14/19 0447  WBC 6.3 5.1 4.3 5.8  NEUTROABS 5.2  --   --   --  HGB 5.6* 8.2* 8.4* 8.3*  HCT 23.2* 29.8* 31.6* 31.5*  MCV 75.6* 79.3* 78.8* 80.4  PLT 304 247 227 [id number]   Basic Metabolic Panel: Recent Labs  Lab 04/11/19 0751 04/12/19 0423 04/13/19 0451 04/14/19 0447  NA 136 137 140 136  K 3.6 3.5 3.8 3.3*  CL 104 104 109 107  CO2 23 26 24  21*  GLUCOSE 144* 91 86 80  BUN 20 13 11 11   CREATININE 1.42* 1.12 0.98 0.86  CALCIUM 9.1 9.2 9.0 8.6*   MG  --  1.9 2.0 1.9   GFR: Estimated Creatinine Clearance: 66.5 mL/min (by C-G formula based on SCr of 0.86 mg/dL). Liver Function Tests: Recent Labs  Lab 04/11/19 0751  AST 24  ALT 15  ALKPHOS 62  BILITOT 0.4  PROT 8.0  ALBUMIN 4.1   No results for input(s): LIPASE, AMYLASE in the last 168 hours. No results for input(s): AMMONIA in the last 168 hours. Coagulation Profile: No results for input(s): INR, PROTIME in the last 168 hours. Cardiac Enzymes: No results for input(s): CKTOTAL, CKMB, CKMBINDEX, TROPONINI in the last 168 hours. BNP (last 3 results) No results for input(s): PROBNP in the last 8760 hours. HbA1C: No results for input(s): HGBA1C in the last 72 hours. CBG: No results for input(s): GLUCAP in the last 168 hours. Lipid Profile: No results for input(s): CHOL, HDL, LDLCALC, TRIG, CHOLHDL, LDLDIRECT in the last 72 hours. Thyroid Function Tests: No results for input(s): TSH, T4TOTAL, FREET4, T3FREE, THYROIDAB in the last 72 hours. Anemia Panel: Recent Labs    04/12/19 0423  FOLATE 11.6   Sepsis Labs: No results for input(s): PROCALCITON, LATICACIDVEN in the last 168 hours.  Recent Results (from the past 240 hour(s))  SARS Coronavirus 2 [facility] order, Performed in [facility] hospital lab) Nasopharyngeal Nasopharyngeal Swab     Status: None   Collection Time: 04/11/19  8:39 AM   Specimen: Nasopharyngeal Swab  Result Value Ref Range Status   SARS Coronavirus 2 NEGATIVE NEGATIVE Final    Comment: (NOTE) If result is NEGATIVE SARS-CoV-2 target nucleic acids are NOT DETECTED. The SARS-CoV-2 RNA is generally detectable in upper and lower  respiratory specimens during the acute phase of infection. The lowest  concentration of SARS-CoV-2 viral copies this assay can detect is 250  copies / mL. A negative result does not preclude SARS-CoV-2 infection  and should not be used as the sole basis for treatment or other  patient management decisions.  A negative  result may occur with  improper specimen collection / handling, submission of specimen other  than nasopharyngeal swab, presence of viral mutation(s) within the  areas targeted by this assay, and inadequate number of viral copies  (<250 copies / mL). A negative result must be combined with clinical  observations, patient history, and epidemiological information. If result is POSITIVE SARS-CoV-2 target nucleic acids are DETECTED. The SARS-CoV-2 RNA is generally detectable in upper and lower  respiratory specimens dur ing the acute phase of infection.  Positive  results are indicative of active infection with SARS-CoV-2.  Clinical  correlation with patient history and other diagnostic information is  necessary to determine patient infection status.  Positive results do  not rule out bacterial infection or co-infection with other viruses. If result is PRESUMPTIVE POSTIVE SARS-CoV-2 nucleic acids MAY BE PRESENT.   A presumptive positive result was obtained on the submitted specimen  and confirmed on repeat testing.  While 2019 novel coronavirus  (SARS-CoV-2) nucleic acids may be present  in the submitted sample  additional confirmatory testing may be necessary for epidemiological  and / or clinical management purposes  to differentiate between  SARS-CoV-2 and other Sarbecovirus currently known to infect humans.  If clinically indicated additional testing with an alternate test  methodology [phone]) is advised. The SARS-CoV-2 RNA is generally  detectable in upper and lower respiratory sp ecimens during the acute  phase of infection. The expected result is Negative. Fact Sheet for Patients:  StrictlyIdeas.no Fact Sheet for Healthcare Providers: BankingDealers.co.za This test is not yet approved or cleared by the [place] FDA and has been authorized for detection and/or diagnosis of SARS-CoV-2 by FDA under an Emergency Use Authorization  (EUA).  This EUA will remain in effect (meaning this test can be used) for the duration of the COVID-19 declaration under Section 564(b)(1) of the Act, 21 U.S.C. section 360bbb-3(b)(1), unless the authorization is terminated or revoked sooner. Performed at [facility], [address] [place], [place] [address]          Radiology Studies: No results found.      Scheduled Meds: . meperidine      . midazolam      . [MAR Hold] atorvastatin  40 mg Oral QHS  . [MAR Hold] pantoprazole  40 mg Oral QAC breakfast  . [MAR Hold] potassium chloride  40 mEq Oral BID   Continuous Infusions: . [MAR Hold] sodium chloride    . sodium chloride 50 mL/hr at 04/12/19 1301  . sodium chloride 1,000 mL (04/14/19 1436)     LOS: 3 days    Time spent: 30 minutes    Jayce Boyko Darleen Crocker, DO [facility] Pager [phone]  If 7PM-7AM, please contact night-coverage [email or web address] Password [email or web address] 04/14/2019, 2:55 PM

## 2019-04-14 NOTE — Evaluation (Signed)
Physical Therapy Evaluation Patient Details Name: Eric [name] MRN: [id number] DOB: [birth year] Today's Date: 04/14/2019   History of Present Illness  Eric [name] is a 73 y.o. male with cerebrovascular disease s/p old CVA with residual right hemiparesis was brought from home because wife became concerned that patient has been up all night wandering the house.  He has progressively we can having a difficult time having bowel movements.  He takes aspirin daily for secondary stroke prevention.    Clinical Impression  Patient cooperative and agreeable for therapy.  Patient demonstrates slow labored movement for sitting up at bedside due to generalized weakness, very unsteady with frequent near loss of balance during bed<>BSC transfers with limited use of RUE due to old CVA, unable to use RW safety during transfers, had to lean on armrest of bed and BSC using LUE.  Patient unable to attempt ambulation due to incontinent of stool secondary to prepping for colonoscopy for today.  Patient put back to bed after sitting on BSC.  Patient will benefit from continued physical therapy in hospital and recommended venue below to increase strength, balance, endurance for safe ADLs and gait.    Follow Up Recommendations SNF    Equipment Recommendations  Other (comment)(to be determined)    Recommendations for Other Services       Precautions / Restrictions Precautions Precautions: Fall Restrictions Weight Bearing Restrictions: No      Mobility  Bed Mobility Overal bed mobility: Needs Assistance Bed Mobility: Supine to Sit;Sit to Supine     Supine to sit: Min assist;Mod assist Sit to supine: Min assist   General bed mobility comments: slow labored movement  Transfers Overall transfer level: Needs assistance Equipment used: 1 person hand held assist;None;Rolling walker (2 wheeled) Transfers: Sit to/from [facility] Sit to Stand: Mod assist Stand pivot transfers: Mod  assist       General transfer comment: unsteady on feet, had to use armrest of BSC during transfers, poor return for using RW due to baseline RUE weakness/poor hand grip strength  Ambulation/Gait Ambulation/Gait assistance: Mod assist;Max assist Gait Distance (Feet): 3 Feet Assistive device: 1 person hand held assist Gait Pattern/deviations: Decreased step length - right;Decreased step length - left;Decreased stride length Gait velocity: slow   General Gait Details: limited to 3-4 unsteady labored steps at bedside, had to lean on armrest of bed for support, poor carryover for attempting to use RW due to right hand weakness  Stairs            Wheelchair Mobility    Modified Rankin (Stroke Patients Only)       Balance Overall balance assessment: Needs assistance Sitting-balance support: Feet supported;No upper extremity supported Sitting balance-Leahy Scale: Fair     Standing balance support: Single extremity supported;During functional activity Standing balance-Leahy Scale: Poor Standing balance comment: hand held assist, had to lean on armrest of BSC                             Pertinent Vitals/Pain Pain Assessment: No/denies pain    Home Living Family/patient expects to be discharged to:: Private residence Living Arrangements: Spouse/significant other Available Help at Discharge: Family;Available 24 hours/day Type of Home: House Home Access: Ramped entrance     Home Layout: One level Home Equipment: Cane - single point;Shower seat      Prior Function Level of Independence: Needs assistance   Gait / Transfers Assistance Needed: household and short distanced community  ambulator without AD  ADL's / Homemaking Assistance Needed: assisted by family        Hand Dominance        Extremity/Trunk Assessment   Upper Extremity Assessment Upper Extremity Assessment: Generalized weakness;RUE deficits/detail;LUE deficits/detail RUE Deficits /  Details: grossly 2+/5, except poor hand grip strength -2/5, mostly nonfunctional RUE: Unable to fully assess due to immobilization RUE Sensation: decreased light touch RUE Coordination: decreased gross motor;decreased fine motor LUE Deficits / Details: grossly -4/5 LUE Sensation: WNL LUE Coordination: WNL    Lower Extremity Assessment Lower Extremity Assessment: Generalized weakness    Cervical / Trunk Assessment Cervical / Trunk Assessment: Normal  Communication   Communication: No difficulties  Cognition Arousal/Alertness: Awake/alert Behavior During Therapy: WFL for tasks assessed/performed Overall Cognitive Status: No family/caregiver present to determine baseline cognitive functioning                                 General Comments: Cooperative and follows directions consistently with repeated verbal cues      General Comments      Exercises     Assessment/Plan    PT Assessment Patient needs continued PT services  PT Problem List Decreased strength;Decreased activity tolerance;Decreased balance;Decreased mobility       PT Treatment Interventions Gait training;Stair training;Functional mobility training;Therapeutic activities;Therapeutic exercise;Patient/family education    PT Goals (Current goals can be found in the Care Plan section)  Acute Rehab PT Goals Patient Stated Goal: return home PT Goal Formulation: With patient Time For Goal Achievement: 04/28/19 Potential to Achieve Goals: Good    Frequency Min 3X/week   Barriers to discharge        Co-evaluation               AM-PAC PT "6 Clicks" Mobility  Outcome Measure Help needed turning from your back to your side while in a flat bed without using bedrails?: A Lot Help needed moving from lying on your back to sitting on the side of a flat bed without using bedrails?: A Lot Help needed moving to and from a bed to a chair (including a wheelchair)?: A Lot Help needed standing up from  a chair using your arms (e.g., wheelchair or bedside chair)?: A Lot Help needed to walk in hospital room?: Total Help needed climbing 3-5 steps with a railing? : Total 6 Click Score: 10    End of Session   Activity Tolerance: Patient tolerated treatment well;Patient limited by fatigue Patient left: in bed;with bed alarm set Nurse Communication: Mobility status PT Visit Diagnosis: Unsteadiness on feet (R26.81);Other abnormalities of gait and mobility (R26.89);Muscle weakness (generalized) (M62.81)    Time: [id number] PT Time Calculation (min) (ACUTE ONLY): 34 min   Charges:   PT Evaluation $PT Eval Moderate Complexity: 1 Mod PT Treatments $Therapeutic Activity: 23-37 mins        2:28 PM, 04/14/19 Lonell Grandchild, MPT Physical Therapist with [facility] [phone] office [phone] mobile phone

## 2019-04-14 NOTE — Brief Op Note (Signed)
04/11/2019 - 04/14/2019  3:58 PM  PATIENT:  Kolton E Harbuck  73 y.o. male  PRE-OPERATIVE DIAGNOSIS:  iron deficiency anemia  POST-OPERATIVE DIAGNOSIS:  disimpacted per dr Ghazi Rumpf  PROCEDURE:  Procedure(s) with comments: IMPACTION REMOVAL - COLON  SURGEON:  Surgeon(s) and Role:    * Gavino Fouch, Marga Melnick, MD - Primary  PHYSICIAN ASSISTANT:   ASSISTANTS: Janeece Riggers, TONYA WILSON  ANESTHESIA:   DEMEROL 43 M GIV, VERSED 3 MG IV  EBL:  NONE   BLOOD ADMINISTERED:none  DRAINS: none   PROCEDURE: ON RECTAL EXAM LARGE STOOL BALL IN VAULT. FORMED STOLL REMOVED DURING DIGITAL EXAM. NO MASS APPRECIATED.  PLAN OF CARE: 1. Mg CITRATE BOWEL PREP.                               2. PLAN COLONOSCOPY SEP 11.                              3. DISCUSSED WITH WIFE.

## 2019-04-14 NOTE — Progress Notes (Signed)
Spoke with nursing staff. Patient completed additional 4L of golytely yesterday evening as well as 2 tap water enemas. He continues to have brown liquid stool with a few pieces. Is to receive 2 additional tap water enemas this morning. Spoke with Dr. Oneida Alar who agrees to proceed with colonoscopy. Orders have been placed.

## 2019-04-14 NOTE — Interval H&P Note (Signed)
History and Physical Interval Note:  04/14/2019 3:10 PM  Eric [name]  has presented today for surgery, with the diagnosis of iron deficiency anemia.  The various methods of treatment have been discussed with the patient and family. After consideration of risks, benefits and other options for treatment, the patient has consented to  Procedure(s): COLONOSCOPY (N/A) as a surgical intervention.  The patient's history has been reviewed, patient examined, no change in status, stable for surgery.  I have reviewed the patient's chart and labs.  Questions were answered to the patient's satisfaction.     [employer]

## 2019-04-14 NOTE — Care Management Important Message (Signed)
Important Message  Patient Details  Name: Eric [name] MRN: [id number] Date of Birth: 1945/12/14   Medicare Important Message Given:  Yes     [name] 04/14/2019, 1:58 PM

## 2019-04-14 NOTE — Care Management (Signed)
[place] InformationSorted ascending, Select to sort descending  Quality of Patient Care RatingSelect to sort ascending or descending . A rating of 4 or more stars means the agency performed better than most other agencies on selected measures. . A rating of 3 to 3 stars means that the agency performed about the same as most agencies. . A rating of fewer than 3 stars means that the agency's performance was below the average of other agencies on selected measures." class="info" href="javascript:void(0)". A rating of 4 or more stars means the agency performed better than most other agencies on selected measures. . A rating of 3 to 3 stars means that the agency performed about the same as most agencies. . A rating of fewer than 3 stars means that the agency's performance was below the average of other agencies on selected measures." class="info" href="javascript:void(0)"  Patient Survey Summary RatingSelect to sort ascending or descending    Home Health Agency InformationSorted ascending, Select to sort descending  Quality of Patient Care RatingSelect to sort ascending or descending . A rating of 4 or more stars means the agency performed better than most other agencies on selected measures. . A rating of 3 to 3 stars means that the agency performed about the same as most agencies. . A rating of fewer than 3 stars means that the agency's performance was below the average of other agencies on selected measures." class="info" href="javascript:void(0)". A rating of 4 or more stars means the agency performed better than most other agencies on selected measures. . A rating of 3 to 3 stars means that the agency performed about the same as most agencies. . A rating of fewer than 3 stars means that the agency's performance was below the average of other agencies on selected measures." class="info" href="javascript:void(0)"  Patient Survey Summary RatingSelect to sort  ascending or descending    [facility] [phone]   [place] to my Favorites  Quality of Patient Care Rating4 out of 5 stars Patient Survey Summary Rating4 out of [place] [phone]   [place] to my Favorites  Quality of Patient Care Rating3 out of 5 stars Patient Survey Summary Rating5 out of [place] [phone]   [place] to my Favorites  Quality of Patient Care Rating3 out of 5 stars Patient Survey Summary Rating4 out of [place] [phone]   [place] to my [place]  out of 5 stars Patient Survey Summary Rating4 out of [place] [phone]   [place] to my [place]  out of 5 stars Patient Survey Summary Rating3 out of [place] [phone]   [place] to my Favorites  Quality of Patient Care Rating4 out of 5 stars Patient Survey Summary Rating4 out of [place] [phone]   [place] to my Favorites  Quality of Patient Care Rating4 out of 5 stars Patient Survey Summary Rating4 out of [place] [phone]   [place] to my Favorites  Quality of Patient Care Rating4 out of 5 stars Patient Survey Summary Rating4  out of [place] AGE [phone]   [place] AGE to my Favorites  Quality of Patient Care Rating3 out of 5 stars Patient Survey Summary Rating3 out of 5 stars  [facility] [place] [phone]   [place] to my Favorites  Quality of Patient Care Rating3  out of 5 stars Patient Survey Summary Rating4 out of 5 stars  [place] [phone]    [place] to my Favorites  Quality of Patient Care Rating3 out of 5 stars Patient Survey Summary Rating4 out of 5 stars  [facility] (336) [phone]   Add [facility] to my Favorites  Quality of Patient Care Rating3  out of 5 stars Patient Survey Summary [place] out of [place] [phone]   Add [facility] to my Favorites  Quality of Patient Care Rating3  out of 5 stars Not [place] (336) [place] to my Favorites  Quality of Patient Care Rating

## 2019-04-14 NOTE — Plan of Care (Signed)
  Problem: Acute Rehab PT Goals(only PT should resolve) Goal: Pt Will Go Supine/Side To Sit Outcome: Progressing Flowsheets (Taken 04/14/2019 1430) Pt will go Supine/Side to Sit: with min guard assist Goal: Patient Will Transfer Sit To/From Stand Outcome: Progressing Flowsheets (Taken 04/14/2019 1430) Patient will transfer sit to/from stand: with minimal assist Goal: Pt Will Transfer Bed To Chair/Chair To Bed Outcome: Progressing Flowsheets (Taken 04/14/2019 1430) Pt will Transfer Bed to Chair/Chair to Bed: with min assist Goal: Pt Will Ambulate Outcome: Progressing Flowsheets (Taken 04/14/2019 1430) Pt will Ambulate:  50 feet  with minimal assist  with cane   2:30 PM, 04/14/19 Lonell Grandchild, MPT Physical Therapist with [facility] [phone] office [phone] mobile phone

## 2019-04-14 NOTE — Plan of Care (Signed)

## 2019-04-14 NOTE — Progress Notes (Signed)
   Assessment/Plan: ADMITTED WITH PROFOUND IRON DEFICIENCY ANEMIA. NO BRBPR OR MELENA. BOWEL PREP STILL INADEQUATE AFTER TWO DAYS.  PLAN: 1. DISIMPACTED  RECTUM IN ENDO 3. 2. MAGNESIUM CITRATE PREP SEP 9 AND SEP 10 AND PLAN FOR COLONOSCOPY SEP 11. 3. CLEAR LIQUID DIET. 4.  MAY NEED REPEAT MAGNESIUM CITRATE ON AM SEP 11. 5. CHANGE TO NS WITH 40 mEQ KCl @50  CC/HR.  Subjective: Since I last evaluated the patient he has had 8Ls Golytely and FOUR TAP WATER ENEMAS. NURSING REPORTS PT HAVING BROWN STOOL. BROUGHT TO ENDO ROOM AND ECTAL EXAM REVEALED IMPACTED STOOL BALL.   Objective: Vital signs in last 24 hours: Vitals:   04/14/19 1445 04/14/19 1450  BP:    Pulse:  68  Resp: 18 15  Temp:    SpO2:  100%   General appearance: alert, cooperative and no distress Resp: clear to auscultation bilaterally Cardio: regular rate and rhythm GI: soft, non-tender; bowel sounds normal; no masses,  no organomegaly RECTUM: LARGE BROWN STOOL BALL IN VAULT. NO BRBPR OR MELENA.  Lab Results:   Cr 0.86 hB 8.3 PLT CT 220   Studies/Results: No results found.  Medications: I have reviewed the patient's current medications.

## 2019-04-14 NOTE — TOC Initial Note (Addendum)
Transition of Care [facility]) - Initial/Assessment Note    Patient Details  Name: Eric [name] MRN: [id number] Date of Birth: [birth year]  Transition of Care [facility]) CM/SW Contact:    Coila Wardell, Chauncey Reading, RN Phone Number: 04/14/2019, 10:35 AM  Clinical Narrative:     Symptomatic anemia. EGD yesterday, colonoscopy today. Seen by PT, recommended for SNF. Patient and wife decline SNF but agreeable to home health. No preference on providers. Patient has PCP, rides transportation van to appointments, niece rides with him. Wife would like to obtain a local PCP, will leave PCP list for wife in patient's room.    He has a cane at home, but does not use, walks independently.   No issues reported by wife with medications. No other needs known. Anticipate patient going home with home health PT and RN.               Expected Discharge Plan: Skilled Nursing Facility Barriers to Discharge: No Barriers Identified   Patient Goals and CMS Choice Patient states their goals for this hospitalization and ongoing recovery are:: patient wishes to return home and wife agrees CMS Medicare.gov Compare Post Acute Care list provided to:: Patient Choice offered to / list presented to : Patient, Spouse      Expected Discharge Plan and Services Expected Discharge Plan: [place]   Discharge Planning Services: CM Consult Post Acute Care Choice: [place] arrangements for the past 2 months: [place] Arranged: RN, PT [facility] Agency: [place] ([facility]) Date [place]: 04/14/19 Time [place]: [id number] Representative spoke with at [place]: [place] Arrangements/Services Living arrangements for the past 2 months: [place] Lives with:: Spouse Patient language and need for interpreter reviewed:: Yes        Need for Family Participation in Patient Care: Yes (Comment) Care giver support  system in place?: Yes (comment) Current home services: DME(cane, but walk independently) Criminal Activity/Legal Involvement Pertinent to Current Situation/Hospitalization: No - Comment as needed  Activities of Daily Living Home Assistive Devices/Equipment: None ADL Screening (condition at time of admission) Patient's cognitive ability adequate to safely complete daily activities?: No Is the patient deaf or have difficulty hearing?: No Does the patient have difficulty seeing, even when wearing glasses/contacts?: No Does the patient have difficulty concentrating, remembering, or making decisions?: Yes Patient able to express need for assistance with ADLs?: No Does the patient have difficulty dressing or bathing?: Yes Independently performs ADLs?: No Communication: Independent Dressing (OT): Needs assistance Is this a change from baseline?: Pre-admission baseline Grooming: Needs assistance Is this a change from baseline?: Pre-admission baseline Feeding: Independent Bathing: Needs assistance Is this a change from baseline?: Pre-admission baseline Toileting: Needs assistance Is this a change from baseline?: Pre-admission baseline In/Out Bed: Needs assistance Is this a change from baseline?: Pre-admission baseline Walks in Home: Needs assistance Is this a change from baseline?: Pre-admission baseline Does the patient have difficulty walking or climbing stairs?: Yes Weakness of Legs: Both Weakness of Arms/Hands: None  Permission Sought/Granted Permission sought to share information with : Other (comment)(home health agency)             Alcohol / Substance Use: Not Applicable Psych Involvement: No (comment)  Admission diagnosis:  Symptomatic anemia [D64.9] Gastrointestinal hemorrhage, unspecified gastrointestinal hemorrhage  type [K92.2] Patient Active Problem List   Diagnosis Date Noted  . Gastrointestinal hemorrhage   . Heme positive stool 04/11/2019  . Orthostatic  hypotension 04/11/2019  . Current smoker 04/11/2019  . Hepatitis C antibody test positive 02/01/2015  . Diastolic dysfunction 0000000  . Leucopenia 01/30/2015  . Thrombocytopenia ([place]) 01/30/2015  . Generalized weakness 01/29/2015  . Diaphoresis 01/29/2015  . Essential hypertension 01/29/2015  . Abnormal EKG 01/29/2015  . Acute kidney injury ([place]) 01/29/2015  . Hyperglycemia 01/29/2015  . UTI (urinary tract infection), bacterial 01/29/2015  . Difficulty walking 11/04/2013  . Stiffness of joints, not elsewhere classified, multiple sites 11/04/2013  . Cerebral artery occlusion with cerebral infarction ([place]) 07/15/2011  . Lack of coordination 07/15/2011  . Symptomatic anemia 06/04/2010   PCP:  Beacher May, MD Pharmacy:  No Pharmacies Listed    Social Determinants of Health (SDOH) Interventions    Readmission Risk Interventions No flowsheet data found.

## 2019-04-15 DIAGNOSIS — D509 Iron deficiency anemia, unspecified: Secondary | ICD-10-CM

## 2019-04-15 LAB — CBC
HCT: 29.4 % — ABNORMAL LOW (ref 39.0–52.0)
Hemoglobin: 7.8 g/dL — ABNORMAL LOW (ref 13.0–17.0)
MCH: 21 pg — ABNORMAL LOW (ref 26.0–34.0)
MCHC: 26.5 g/dL — ABNORMAL LOW (ref 30.0–36.0)
MCV: 79.2 fL — ABNORMAL LOW (ref 80.0–100.0)
Platelets: 228 10*3/uL (ref 150–400)
RBC: 3.71 MIL/uL — ABNORMAL LOW (ref 4.22–5.81)
RDW: 20.8 % — ABNORMAL HIGH (ref 11.5–15.5)
WBC: 5.3 10*3/uL (ref 4.0–10.5)
nRBC: 0 % (ref 0.0–0.2)

## 2019-04-15 LAB — BASIC METABOLIC PANEL
Anion gap: 6 (ref 5–15)
BUN: 10 mg/dL (ref 8–23)
CO2: 22 mmol/L (ref 22–32)
Calcium: 8.6 mg/dL — ABNORMAL LOW (ref 8.9–10.3)
Chloride: 112 mmol/L — ABNORMAL HIGH (ref 98–111)
Creatinine, Ser: 0.91 mg/dL (ref 0.61–1.24)
GFR calc Af Amer: >60 mL/min (ref >60)
GFR calc non Af Amer: >60 mL/min (ref >60)
Glucose, Bld: 85 mg/dL (ref 70–99)
Potassium: 3.9 mmol/L (ref 3.5–5.1)
Sodium: 140 mmol/L (ref 135–145)

## 2019-04-15 MED ORDER — INFLUENZA VAC A&B SA ADJ QUAD 0.5 ML IM PRSY
0.5000 mL | PREFILLED_SYRINGE | INTRAMUSCULAR | Status: AC
  Administered 2019-04-16: 08:00:00 0.5 mL via INTRAMUSCULAR
  Filled 2019-04-15 (×2): qty 0.5

## 2019-04-15 MED ORDER — MAGNESIUM CITRATE PO SOLN
1.0000 | Freq: Once | ORAL | Status: AC
  Administered 2019-04-16: 07:00:00 1 via ORAL
  Filled 2019-04-15: qty 296

## 2019-04-15 NOTE — TOC Progression Note (Signed)
Transition of Care [facility]) - Progression Note    Patient Details  Name: Eric [name] MRN: [id number] Date of Birth: 1946-01-30  Transition of Care [facility]) CM/SW Contact  Aava Deland, Chauncey Reading, RN Phone Number: 04/15/2019, 4:06 PM  Clinical Narrative:    [facility] has made a bed offer. Wife updated, accepts offer.     Expected Discharge Plan: [place] Barriers to Discharge: No Barriers Identified  Expected Discharge Plan and Services Expected Discharge Plan: [place]   Discharge Planning Services: CM Consult Post Acute Care Choice: [place] arrangements for the past 2 months: Single Family Home                           HH Arranged: RN, PT [facility] Agency: [place]) Date [place]: 04/14/19 Time [place]: [id number] Representative spoke with at [place]: [place] ([place]) Interventions    Readmission Risk Interventions No flowsheet data found.

## 2019-04-15 NOTE — Progress Notes (Signed)
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 He has progressively we can having a difficult time having bowel movements.He takes aspirin daily for secondary stroke prevention. He was admitted with anemia Hg 5.6 and stable, chronic GI bleed.   9/9: GI plans for colonoscopy today.  PT recommending SNF/rehab that patient insisting on possible home with home health physical therapy.  9/10: GI attempted colonoscopy on 9/9 with large stool burden noted.  Magnesium citrate being attempted for the time being with plans to repeat colonoscopy by 9/11.  Hemoglobin slightly downtrending, but no overt bleeding identified.  PT still recommending SNF on discharge.  Assessment & Plan:   Principal Problem:   Symptomatic anemia Active Problems:   Lack of coordination   Difficulty walking   Generalized weakness   Essential hypertension   Acute kidney injury ([place])   Hyperglycemia   Diastolic dysfunction   Heme positive stool   Orthostatic hypotension   Current smoker   Gastrointestinal hemorrhage   Acute symptomatic blood loss anemia likely secondary to GI bleed -EGD on 9/8 only reflects mild gastritis with no other stigmata of active bleeding noted -Patient status post 2 unit PRBCs with hemoglobin stable at 8 -Protonix IV twice daily -Colonoscopy per GI plan for 9/11 -Repeat CBC in a.m.  Essential hypertension-controlled -Continue home blood pressure agents as prescribed  AKI-resolved -Continue to monitor repeat a.m. labs  Hyperglycemia-resolved -Continue to monitor -No history of DM  Generalized weakness -PT recommending SNF rehab -Wife to determine whether patient will come home with home health versus  rehab  History of CVA -Holding aspirin for now -Continue atorvastatin  Tobacco abuse -Counseled on cessation -Nicotine patch  DVT prophylaxis: SCDs Code Status: Full Family Communication: None at bedside Disposition Plan: Per GI, plans for colonoscopy in a.m. possible discharge in the next 1 to 2 days to home with home health versus rehab.   Consultants:   GI  Procedures:   EGD 9/8  Antimicrobials:   None   Subjective: Patient seen and evaluated today with no new acute complaints or concerns. No acute concerns or events noted overnight.  Objective: Vitals:   04/14/19 1554 04/14/19 1955 04/14/19 2057 04/15/19 0537  BP: (!) 148/82  140/74 126/68  Pulse: 71  (!) 101 75  Resp: 17  18 17   Temp: 98.5 F (36.9 C)  98.2 F (36.8 C) 98.1 F (36.7 C)  TempSrc:      SpO2: 98% 98% 97% 100%  Weight:      Height:        Intake/Output Summary (Last 24 hours) at 04/15/2019 1132 Last data filed at 04/15/2019 0800 Gross per 24 hour  Intake 2245.28 ml  Output 450 ml  Net 1795.28 ml   Filed Weights   04/11/19 0706 04/11/19 1044 04/14/19 1411  Weight: 86.2 kg 65.7 kg 63.5 kg    Examination:  General exam: Appears calm and comfortable  Respiratory system: Clear to auscultation. Respiratory effort normal. Cardiovascular system: S1 & S2 heard, RRR. No JVD, murmurs, rubs, gallops or clicks. No pedal edema. Gastrointestinal system: Abdomen is nondistended, soft and nontender. No organomegaly or masses felt. Normal bowel sounds heard. Central nervous system: Alert and awake Extremities: Symmetric 5 x 5 power. Skin: No rashes, lesions  or ulcers Psychiatry: Judgement and insight appear normal. Mood & affect appropriate.     Data Reviewed: I have personally reviewed following labs and imaging studies  CBC: Recent Labs  Lab 04/11/19 0751 04/12/19 0423 04/13/19 0451 04/14/19 0447 04/15/19 0449  WBC 6.3 5.1 4.3 5.8 5.3  NEUTROABS 5.2  --   --   --   --   HGB  5.6* 8.2* 8.4* 8.3* 7.8*  HCT 23.2* 29.8* 31.6* 31.5* 29.4*  MCV 75.6* 79.3* 78.8* 80.4 79.2*  PLT 304 247 227 220 [id number]   Basic Metabolic Panel: Recent Labs  Lab 04/11/19 0751 04/12/19 0423 04/13/19 0451 04/14/19 0447 04/15/19 0449  NA 136 137 140 136 140  K 3.6 3.5 3.8 3.3* 3.9  CL 104 104 109 107 112*  CO2 23 26 24  21* 22  GLUCOSE 144* 91 86 80 85  BUN 20 13 11 11 10   CREATININE 1.42* 1.12 0.98 0.86 0.91  CALCIUM 9.1 9.2 9.0 8.6* 8.6*  MG  --  1.9 2.0 1.9  --    GFR: Estimated Creatinine Clearance: 62.9 mL/min (by C-G formula based on SCr of 0.91 mg/dL). Liver Function Tests: Recent Labs  Lab 04/11/19 0751  AST 24  ALT 15  ALKPHOS 62  BILITOT 0.4  PROT 8.0  ALBUMIN 4.1   No results for input(s): LIPASE, AMYLASE in the last 168 hours. No results for input(s): AMMONIA in the last 168 hours. Coagulation Profile: No results for input(s): INR, PROTIME in the last 168 hours. Cardiac Enzymes: No results for input(s): CKTOTAL, CKMB, CKMBINDEX, TROPONINI in the last 168 hours. BNP (last 3 results) No results for input(s): PROBNP in the last 8760 hours. HbA1C: No results for input(s): HGBA1C in the last 72 hours. CBG: No results for input(s): GLUCAP in the last 168 hours. Lipid Profile: No results for input(s): CHOL, HDL, LDLCALC, TRIG, CHOLHDL, LDLDIRECT in the last 72 hours. Thyroid Function Tests: No results for input(s): TSH, T4TOTAL, FREET4, T3FREE, THYROIDAB in the last 72 hours. Anemia Panel: No results for input(s): VITAMINB12, FOLATE, FERRITIN, TIBC, IRON, RETICCTPCT in the last 72 hours. Sepsis Labs: No results for input(s): PROCALCITON, LATICACIDVEN in the last 168 hours.  Recent Results (from the past 240 hour(s))  SARS Coronavirus 2 [facility] order, Performed in [facility] hospital lab) Nasopharyngeal Nasopharyngeal Swab     Status: None   Collection Time: 04/11/19  8:39 AM   Specimen: Nasopharyngeal Swab  Result Value Ref Range Status   SARS  Coronavirus 2 NEGATIVE NEGATIVE Final    Comment: (NOTE) If result is NEGATIVE SARS-CoV-2 target nucleic acids are NOT DETECTED. The SARS-CoV-2 RNA is generally detectable in upper and lower  respiratory specimens during the acute phase of infection. The lowest  concentration of SARS-CoV-2 viral copies this assay can detect is 250  copies / mL. A negative result does not preclude SARS-CoV-2 infection  and should not be used as the sole basis for treatment or other  patient management decisions.  A negative result may occur with  improper specimen collection / handling, submission of specimen other  than nasopharyngeal swab, presence of viral mutation(s) within the  areas targeted by this assay, and inadequate number of viral copies  (<250 copies / mL). A negative result must be combined with clinical  observations, patient history, and epidemiological information. If result is POSITIVE SARS-CoV-2 target nucleic acids are DETECTED. The SARS-CoV-2 RNA is generally detectable in upper and lower  respiratory specimens dur ing the acute phase of infection.  Positive  results are indicative of active infection with SARS-CoV-2.  Clinical  correlation with patient history and other diagnostic information is  necessary to determine patient infection status.  Positive results do  not rule out bacterial infection or co-infection with other viruses. If result is PRESUMPTIVE POSTIVE SARS-CoV-2 nucleic acids MAY BE PRESENT.   A presumptive positive result was obtained on the submitted specimen  and confirmed on repeat testing.  While 2019 novel coronavirus  (SARS-CoV-2) nucleic acids may be present in the submitted sample  additional confirmatory testing may be necessary for epidemiological  and / or clinical management purposes  to differentiate between  SARS-CoV-2 and other Sarbecovirus currently known to infect humans.  If clinically indicated additional testing with an alternate test   methodology [phone]) is advised. The SARS-CoV-2 RNA is generally  detectable in upper and lower respiratory sp ecimens during the acute  phase of infection. The expected result is Negative. Fact Sheet for Patients:  StrictlyIdeas.no Fact Sheet for Healthcare Providers: BankingDealers.co.za This test is not yet approved or cleared by the [place] FDA and has been authorized for detection and/or diagnosis of SARS-CoV-2 by FDA under an Emergency Use Authorization (EUA).  This EUA will remain in effect (meaning this test can be used) for the duration of the COVID-19 declaration under Section 564(b)(1) of the Act, 21 U.S.C. section 360bbb-3(b)(1), unless the authorization is terminated or revoked sooner. Performed at [facility], [address], [place],  [address]          Radiology Studies: No results found.      Scheduled Meds: . atorvastatin  40 mg Oral QHS  . [START ON 04/16/2019] influenza vaccine adjuvanted  0.5 mL Intramuscular Tomorrow-1000  . pantoprazole  40 mg Oral QAC breakfast  . zolpidem  5 mg Oral Once   Continuous Infusions: . sodium chloride    . 0.9 % NaCl with KCl 40 mEq / L 50 mL/hr (04/14/19 1902)     LOS: 4 days    Time spent: 30 minutes    Christella App Darleen Crocker, DO [facility] Pager [phone]  If 7PM-7AM, please contact night-coverage [email or web address] Password [facility] 04/15/2019, 11:32 AM

## 2019-04-15 NOTE — TOC Progression Note (Signed)
Transition of Care [facility]) - Progression Note    Patient Details  Name: Eric [name] MRN: [id number] Date of Birth: Mar 02, 1946  Transition of Care [facility]) CM/SW Contact  Katurah Karapetian, Chauncey Reading, RN Phone Number: 04/15/2019, 10:41 AM  Clinical Narrative:   Called wife again to go over PT eval. She plans to come today and be present during PT assessment to determine if she can manage patient at home.    Expected Discharge Plan: [place] Barriers to Discharge: No Barriers Identified  Expected Discharge Plan and Services Expected Discharge Plan: [place]   Discharge Planning Services: CM Consult Post Acute Care Choice: [place] arrangements for the past 2 months: Single Family Home                           HH Arranged: RN, PT [facility] Agency: [place] ([place]) Date [place]: 04/14/19 Time [place]: [id number] Representative spoke with at [place]: [place] ([place]) Interventions    Readmission Risk Interventions No flowsheet data found.

## 2019-04-15 NOTE — Progress Notes (Signed)
Physical Therapy Treatment Patient Details Name: Eric [name] MRN: [id number] DOB: 1946/04/09 Today's Date: 04/15/2019    History of Present Illness Eric [name] is a 73 y.o. male with cerebrovascular disease s/p old CVA with residual right hemiparesis was brought from home because wife became concerned that patient has been up all night wandering the house.  He has progressively we can having a difficult time having bowel movements.  He takes aspirin daily for secondary stroke prevention.    PT Comments    Patient presents alert and agreeable for therapy.  Patient demonstrates increased endurance/distance for ambulation and can hold onto RW with his right hand, but requires assistance to put right hand onto walker, then able to grip and support self with RUE.   Patient loses balance when attempting sit to stands/transfers without use of an AD and requires frequent verbal/tactile cueing to use RW properly to avoid loss of balance.  Patient put back to bed after therapy secondary to taking medication to prep for colonoscopy.  Patient will benefit from continued physical therapy in hospital and recommended venue below to increase strength, balance, endurance for safe ADLs and gait.   Follow Up Recommendations  SNF;Supervision for mobility/OOB;Supervision/Assistance - 24 hour     Equipment Recommendations  Rolling walker with 5" wheels    Recommendations for Other Services       Precautions / Restrictions Precautions Precautions: Fall Restrictions Weight Bearing Restrictions: No    Mobility  Bed Mobility Overal bed mobility: Needs Assistance Bed Mobility: Supine to Sit;Sit to Supine     Supine to sit: Min assist Sit to supine: Min assist   General bed mobility comments: slow labored movement with frequent verbal/tactile cueing to complete tasks  Transfers Overall transfer level: Needs assistance Equipment used: Rolling walker (2 wheeled);1 person hand held  assist Transfers: Sit to/from [facility] Sit to Stand: Mod assist Stand pivot transfers: Mod assist       General transfer comment: had loss of balance with near fall prevented by writer when transferring to chair, had to use RW for safety  Ambulation/Gait Ambulation/Gait assistance: Min assist Gait Distance (Feet): 75 Feet Assistive device: Rolling walker (2 wheeled) Gait Pattern/deviations: Decreased step length - right;Decreased step length - left;Decreased stride length Gait velocity: decreased   General Gait Details: hand to place patient's right hand onto RW, then patient able to grip, very unsteady on feet during initial 10-15 steps demonstrating incoordination of legs, after a few minutes patient able to control movement of legs without knees buckling for rest of ambulation   Stairs             Wheelchair Mobility    Modified Rankin (Stroke Patients Only)       Balance Overall balance assessment: Needs assistance Sitting-balance support: Feet supported Sitting balance-Leahy Scale: Fair Sitting balance - Comments: seated at bedside   Standing balance support: During functional activity;No upper extremity supported Standing balance-Leahy Scale: Poor Standing balance comment: falls when not using an AD, fair/poor using RW                            Cognition Arousal/Alertness: Awake/alert Behavior During Therapy: WFL for tasks assessed/performed Overall Cognitive Status: No family/caregiver present to determine baseline cognitive functioning  Exercises      General Comments        Pertinent Vitals/Pain Pain Assessment: No/denies pain    Home Living                      Prior Function            PT Goals (current goals can now be found in the care plan section) Acute Rehab PT Goals Patient Stated Goal: return home PT Goal Formulation: With patient Time  For Goal Achievement: 04/28/19 Potential to Achieve Goals: Good Progress towards PT goals: Progressing toward goals    Frequency    Min 3X/week      PT Plan Current plan remains appropriate    Co-evaluation              AM-PAC PT "6 Clicks" Mobility   Outcome Measure  Help needed turning from your back to your side while in a flat bed without using bedrails?: A Little Help needed moving from lying on your back to sitting on the side of a flat bed without using bedrails?: A Little Help needed moving to and from a bed to a chair (including a wheelchair)?: A Lot Help needed standing up from a chair using your arms (e.g., wheelchair or bedside chair)?: A Lot Help needed to walk in hospital room?: A Lot Help needed climbing 3-5 steps with a railing? : A Lot 6 Click Score: 14    End of Session Equipment Utilized During Treatment: Gait belt Activity Tolerance: Patient tolerated treatment well;Patient limited by fatigue Patient left: in bed;with call bell/phone within reach;with bed alarm set Nurse Communication: Mobility status PT Visit Diagnosis: Unsteadiness on feet (R26.81);Other abnormalities of gait and mobility (R26.89);Muscle weakness (generalized) (M62.81)     Time: [id number] PT Time Calculation (min) (ACUTE ONLY): 29 min  Charges:  $Gait Training: 8-22 mins $Therapeutic Activity: 8-22 mins                     11:51 AM, 04/15/19 Lonell Grandchild, MPT Physical Therapist with [facility] [phone] office [phone] mobile phone

## 2019-04-15 NOTE — Progress Notes (Signed)
Subjective:  No complaints  Objective: Vital signs in last 24 hours: Temp:  [97.7 F (36.5 C)-98.5 F (36.9 C)] 97.7 F (36.5 C) (09/10 1323) Pulse Rate:  [64-101] 84 (09/10 1323) Resp:  [14-33] 18 (09/10 1323) BP: (126-148)/(60-82) 141/76 (09/10 1323) SpO2:  [97 %-100 %] 99 % (09/10 1323) Last BM Date: 04/15/19 General:   Alert,  Well-developed, well-nourished, pleasant and cooperative in NAD Head:  Normocephalic and atraumatic. Eyes:  Sclera clear, no icterus.  Abdomen:  Soft, nontender and nondistended.  Normal bowel sounds, without guarding, and without rebound.   Extremities:  Without clubbing, deformity or edema. Neurologic:  Alert and  oriented x4;  grossly normal neurologically. Skin:  Intact without significant lesions or rashes. Psych:  Alert and cooperative. Normal mood and affect.  Intake/Output from previous day: 09/09 0701 - 09/10 0700 In: 1645.3 [P.O.:240; I.V.:1405.3] Out: 450 [Urine:450] Intake/Output this shift: Total I/O In: 600 [P.O.:600] Out: -   Lab Results: CBC Recent Labs    04/13/19 0451 04/14/19 0447 04/15/19 0449  WBC 4.3 5.8 5.3  HGB 8.4* 8.3* 7.8*  HCT 31.6* 31.5* 29.4*  MCV 78.8* 80.4 79.2*  PLT 227 220 228   BMET Recent Labs    04/13/19 0451 04/14/19 0447 04/15/19 0449  NA 140 136 140  K 3.8 3.3* 3.9  CL 109 107 112*  CO2 24 21* 22  GLUCOSE 86 80 85  BUN 11 11 10   CREATININE 0.98 0.86 0.91  CALCIUM 9.0 8.6* 8.6*   LFTs No results for input(s): BILITOT, BILIDIR, IBILI, ALKPHOS, AST, ALT, PROT, ALBUMIN in the last 72 hours. No results for input(s): LIPASE in the last 72 hours. PT/INR No results for input(s): LABPROT, INR in the last 72 hours.    Imaging Studies: Dg Abdomen 1 View  Result Date: 04/11/2019 CLINICAL DATA:  Abdominal distension. EXAM: ABDOMEN - 1 VIEW COMPARISON:  Chest and two views abdomen 08/24/2009. FINDINGS: The bowel gas pattern is normal. No radio-opaque calculi or other significant radiographic  abnormality are seen. Multilevel lumbar spondylosis is identified. Convex left thoracolumbar curvature. IMPRESSION: No acute finding. Electronically Signed   By: Inge Rise M.D.   On: 04/11/2019 08:31  [2 weeks]   Assessment: 73 year old male admitted with profound iron deficiency anemia (admission Hgb 5.6). received two units of prbcs. No melena or rectal bleeding.  Due to significant constipation colonoscopy has been postponed.  Consumed 8 L of GoLYTELY and 4 tap water enemas.  Could not complete colonoscopy on September 8 due to poor prep.  On September 9, nursing staff reported patient passing brown stool.  Underwent fecal disimpaction and Endo with conscious sedation.  Consumed magnesium citrate (1.5 bottles) last night and this morning (total of 3 bottles). Nursing staff report four Bristol 7 stools today, watery brown in color.   EGD on September 8.  1 benign-appearing, intrinsic moderate stenosis.  Medium sized hiatal hernia.  Mild gastritis status post biopsy.   Plan: 1. Colonoscopy planned for September 11.  Laureen Ochs. Bernarda Caffey [facility] [phone] 9/10/20202:47 PM     LOS: 4 days

## 2019-04-15 NOTE — TOC Progression Note (Signed)
Transition of Care [facility]) - Progression Note    Patient Details  Name: Eric [name] MRN: [id number] Date of Birth: 06/26/1946  Transition of Care [facility]) CM/SW Contact  Onalee Steinbach, Chauncey Reading, RN Phone Number: 04/15/2019, 12:53 PM  Clinical Narrative:   Patient and wife now agreeable to SNF. Would like referral to [place] facilities.     Expected Discharge Plan: [place] Barriers to Discharge: No Barriers Identified  Expected Discharge Plan and Services Expected Discharge Plan: [place]   Discharge Planning Services: CM Consult Post Acute Care Choice: [place] arrangements for the past 2 months: Single Family Home                           HH Arranged: RN, PT [facility] Agency: [place] ([place]) Date [place]: 04/14/19 Time [place]: [id number] Representative spoke with at [place]: [place] ([place]) Interventions    Readmission Risk Interventions No flowsheet data found.

## 2019-04-15 NOTE — H&P (View-Only) (Signed)
Subjective:  No complaints  Objective: Vital signs in last 24 hours: Temp:  [97.7 F (36.5 C)-98.5 F (36.9 C)] 97.7 F (36.5 C) (09/10 1323) Pulse Rate:  [64-101] 84 (09/10 1323) Resp:  [14-33] 18 (09/10 1323) BP: (126-148)/(60-82) 141/76 (09/10 1323) SpO2:  [97 %-100 %] 99 % (09/10 1323) Last BM Date: 04/15/19 General:   Alert,  Well-developed, well-nourished, pleasant and cooperative in NAD Head:  Normocephalic and atraumatic. Eyes:  Sclera clear, no icterus.  Abdomen:  Soft, nontender and nondistended.  Normal bowel sounds, without guarding, and without rebound.   Extremities:  Without clubbing, deformity or edema. Neurologic:  Alert and  oriented x4;  grossly normal neurologically. Skin:  Intact without significant lesions or rashes. Psych:  Alert and cooperative. Normal mood and affect.  Intake/Output from previous day: 09/09 0701 - 09/10 0700 In: 1645.3 [P.O.:240; I.V.:1405.3] Out: 450 [Urine:450] Intake/Output this shift: Total I/O In: 600 [P.O.:600] Out: -   Lab Results: CBC Recent Labs    04/13/19 0451 04/14/19 0447 04/15/19 0449  WBC 4.3 5.8 5.3  HGB 8.4* 8.3* 7.8*  HCT 31.6* 31.5* 29.4*  MCV 78.8* 80.4 79.2*  PLT 227 220 228   BMET Recent Labs    04/13/19 0451 04/14/19 0447 04/15/19 0449  NA 140 136 140  K 3.8 3.3* 3.9  CL 109 107 112*  CO2 24 21* 22  GLUCOSE 86 80 85  BUN 11 11 10   CREATININE 0.98 0.86 0.91  CALCIUM 9.0 8.6* 8.6*   LFTs No results for input(s): BILITOT, BILIDIR, IBILI, ALKPHOS, AST, ALT, PROT, ALBUMIN in the last 72 hours. No results for input(s): LIPASE in the last 72 hours. PT/INR No results for input(s): LABPROT, INR in the last 72 hours.    Imaging Studies: Dg Abdomen 1 View  Result Date: 04/11/2019 CLINICAL DATA:  Abdominal distension. EXAM: ABDOMEN - 1 VIEW COMPARISON:  Chest and two views abdomen 08/24/2009. FINDINGS: The bowel gas pattern is normal. No radio-opaque calculi or other significant radiographic  abnormality are seen. Multilevel lumbar spondylosis is identified. Convex left thoracolumbar curvature. IMPRESSION: No acute finding. Electronically Signed   By: Inge Rise M.D.   On: 04/11/2019 08:31  [2 weeks]   Assessment: 73 year old male admitted with profound iron deficiency anemia (admission Hgb 5.6). received two units of prbcs. No melena or rectal bleeding.  Due to significant constipation colonoscopy has been postponed.  Consumed 8 L of GoLYTELY and 4 tap water enemas.  Could not complete colonoscopy on September 8 due to poor prep.  On September 9, nursing staff reported patient passing brown stool.  Underwent fecal disimpaction and Endo with conscious sedation.  Consumed magnesium citrate (1.5 bottles) last night and this morning (total of 3 bottles). Nursing staff report four Bristol 7 stools today, watery brown in color.   EGD on September 8.  1 benign-appearing, intrinsic moderate stenosis.  Medium sized hiatal hernia.  Mild gastritis status post biopsy.   Plan: 1. Colonoscopy planned for September 11.  Laureen Ochs. Bernarda Caffey [facility] [phone] 9/10/20202:47 PM     LOS: 4 days

## 2019-04-15 NOTE — NC FL2 (Signed)
[place] MEDICAID FL2 LEVEL OF CARE SCREENING TOOL     IDENTIFICATION  Patient Name: Eric [name] Birthdate: June 23, 1946 Sex: male Admission Date (Current Location): 04/11/2019  [facility] [place] Number:  [employer] and Address:  [facility] [address] [place]      Provider Number: [id number]  Attending Physician Name and Address:  Rodena Goldmann, DO  Relative Name and Phone Number:  Carolynn Soltis [phone]    Current Level of Care: Hospital Recommended Level of Care: [place] Prior Approval Number:    Date Approved/Denied: 04/15/19 PASRR Number: [id number]  Discharge Plan: SNF    Current Diagnoses: Patient Active Problem List   Diagnosis Date Noted  . Gastrointestinal hemorrhage   . Heme positive stool 04/11/2019  . Orthostatic hypotension 04/11/2019  . Current smoker 04/11/2019  . Hepatitis C antibody test positive 02/01/2015  . Diastolic dysfunction 0000000  . Leucopenia 01/30/2015  . Thrombocytopenia ([place]) 01/30/2015  . Generalized weakness 01/29/2015  . Diaphoresis 01/29/2015  . Essential hypertension 01/29/2015  . Abnormal EKG 01/29/2015  . Acute kidney injury ([place]) 01/29/2015  . Hyperglycemia 01/29/2015  . UTI (urinary tract infection), bacterial 01/29/2015  . Difficulty walking 11/04/2013  . Stiffness of joints, not elsewhere classified, multiple sites 11/04/2013  . Cerebral artery occlusion with cerebral infarction ([place]) 07/15/2011  . Lack of coordination 07/15/2011  . Symptomatic anemia 06/04/2010    Orientation RESPIRATION BLADDER Height & Weight     Self, Time, Situation  O2(at times (see DC summary)) External catheter Weight: 63.5 kg Height:  5\' 5"  (165.1 cm)  BEHAVIORAL SYMPTOMS/MOOD NEUROLOGICAL BOWEL NUTRITION STATUS      Incontinent(due to having  multiple bowel preps) Diet(full liquid (preparing for colonoscopy)-see DC summary)  AMBULATORY STATUS COMMUNICATION OF  NEEDS Skin   Extensive Assist Verbally Normal                       Personal Care Assistance Level of Assistance  Bathing, Feeding, Dressing Bathing Assistance: Limited assistance Feeding assistance: Limited assistance Dressing Assistance: Limited assistance     Functional Limitations Info  Sight, Hearing, Speech Sight Info: Adequate Hearing Info: Adequate Speech Info: Adequate    SPECIAL CARE FACTORS FREQUENCY  PT (By licensed PT)     PT Frequency: 5 times /week              Contractures Contractures Info: Not present    Additional Factors Info  Code Status, Allergies Code Status Info: Full code Allergies Info: NO KNOWN ALLERGIES           Current Medications (04/15/2019):  This is the current hospital active medication list Current Facility-Administered Medications  Medication Dose Route Frequency Provider Last Rate Last Dose  . 0.9 %  sodium chloride infusion  10 mL/hr Intravenous Once Fields, Sandi L, MD      . 0.9 % NaCl with KCl 40 mEq / L  infusion   Intravenous Continuous Fields, Sandi L, MD 50 mL/hr at 04/14/19 1902 50 mL/hr at 04/14/19 1902  . acetaminophen (TYLENOL) tablet 650 mg  650 mg Oral Q6H PRN Fields, Sandi L, MD       Or  . acetaminophen (TYLENOL) suppository 650 mg  650 mg Rectal Q6H PRN Fields, Sandi L, MD      . atorvastatin (LIPITOR) tablet 40 mg  40 mg Oral QHS Fields, Sandi L, MD   40 mg at 04/14/19 2256  . [START  ON 04/16/2019] influenza vaccine adjuvanted (FLUAD) injection 0.5 mL  0.5 mL Intramuscular Tomorrow-1000 Shah, Pratik D, DO      . nicotine (NICODERM CQ - dosed in mg/24 hours) patch 21 mg  21 mg Transdermal Daily PRN Fields, Sandi L, MD   21 mg at 04/12/19 1730  . ondansetron (ZOFRAN) tablet 4 mg  4 mg Oral Q6H PRN Fields, Sandi L, MD       Or  . ondansetron (ZOFRAN) injection 4 mg  4 mg Intravenous Q6H PRN Danie Binder, MD   4 mg at 04/12/19 2034  . pantoprazole (PROTONIX) EC tablet 40 mg  40 mg Oral QAC breakfast Fields,  Sandi L, MD   40 mg at 04/15/19 0836  . zolpidem (AMBIEN) tablet 5 mg  5 mg Oral Once Kirby-Graham, Karsten Fells, NP         Discharge Medications: Please see discharge summary for a list of discharge medications.  Relevant Imaging Results:  Relevant Lab Results:   Additional Information SSN # [id number]  Darrnell Mangiaracina, Chauncey Reading, RN

## 2019-04-15 NOTE — Progress Notes (Signed)
1630 tap water enema completed. At the moment stool is still watery and light brown. Will continue to monitor.

## 2019-04-15 NOTE — Progress Notes (Signed)
1830 tap water enema completed.

## 2019-04-16 ENCOUNTER — Telehealth: Payer: Self-pay | Admitting: Gastroenterology

## 2019-04-16 ENCOUNTER — Encounter (HOSPITAL_COMMUNITY)
Admission: EM | Disposition: A | Discharge status: Skilled Nursing Facility | Payer: Self-pay | Source: Home / Self Care | Attending: Internal Medicine

## 2019-04-16 ENCOUNTER — Encounter (HOSPITAL_COMMUNITY): Payer: Self-pay | Admitting: Gastroenterology

## 2019-04-16 DIAGNOSIS — D125 Benign neoplasm of sigmoid colon: Secondary | ICD-10-CM

## 2019-04-16 DIAGNOSIS — D122 Benign neoplasm of ascending colon: Secondary | ICD-10-CM

## 2019-04-16 HISTORY — PX: BIOPSY: SHX5522

## 2019-04-16 HISTORY — PX: POLYPECTOMY: SHX5525

## 2019-04-16 HISTORY — PX: COLONOSCOPY: SHX5424

## 2019-04-16 LAB — CBC
HCT: 30 % — ABNORMAL LOW (ref 39.0–52.0)
Hemoglobin: 7.9 g/dL — ABNORMAL LOW (ref 13.0–17.0)
MCH: 20.8 pg — ABNORMAL LOW (ref 26.0–34.0)
MCHC: 26.3 g/dL — ABNORMAL LOW (ref 30.0–36.0)
MCV: 79.2 fL — ABNORMAL LOW (ref 80.0–100.0)
Platelets: 215 10*3/uL (ref 150–400)
RBC: 3.79 MIL/uL — ABNORMAL LOW (ref 4.22–5.81)
RDW: 21 % — ABNORMAL HIGH (ref 11.5–15.5)
WBC: 2.9 10*3/uL — ABNORMAL LOW (ref 4.0–10.5)
nRBC: 0 % (ref 0.0–0.2)

## 2019-04-16 LAB — BASIC METABOLIC PANEL
Anion gap: 5 (ref 5–15)
BUN: 6 mg/dL — ABNORMAL LOW (ref 8–23)
CO2: 25 mmol/L (ref 22–32)
Calcium: 8.5 mg/dL — ABNORMAL LOW (ref 8.9–10.3)
Chloride: 109 mmol/L (ref 98–111)
Creatinine, Ser: 0.87 mg/dL (ref 0.61–1.24)
GFR calc Af Amer: >60 mL/min (ref >60)
GFR calc non Af Amer: >60 mL/min (ref >60)
Glucose, Bld: 87 mg/dL (ref 70–99)
Potassium: 3.9 mmol/L (ref 3.5–5.1)
Sodium: 139 mmol/L (ref 135–145)

## 2019-04-16 SURGERY — COLONOSCOPY
Anesthesia: Moderate Sedation

## 2019-04-16 MED ORDER — MIDAZOLAM HCL 5 MG/5ML IJ SOLN
INTRAMUSCULAR | Status: DC | PRN
  Administered 2019-04-16 (×4): 1 mg via INTRAVENOUS

## 2019-04-16 MED ORDER — SODIUM CHLORIDE (PF) 0.9 % IJ SOLN
PREFILLED_SYRINGE | INTRAMUSCULAR | Status: DC | PRN
  Administered 2019-04-16: 15:00:00 1 mL

## 2019-04-16 MED ORDER — MIDAZOLAM HCL 5 MG/5ML IJ SOLN
INTRAMUSCULAR | Status: AC
  Filled 2019-04-16: qty 10

## 2019-04-16 MED ORDER — SPOT INK MARKER SYRINGE KIT
PACK | SUBMUCOSAL | Status: DC | PRN
  Administered 2019-04-16: 1 mL via SUBMUCOSAL

## 2019-04-16 MED ORDER — EPINEPHRINE 1 MG/10ML IJ SOSY
PREFILLED_SYRINGE | INTRAMUSCULAR | Status: AC
  Filled 2019-04-16: qty 10

## 2019-04-16 MED ORDER — MEPERIDINE HCL 100 MG/ML IJ SOLN
INTRAMUSCULAR | Status: AC
  Filled 2019-04-16: qty 2

## 2019-04-16 MED ORDER — AMOXICILLIN 250 MG PO CAPS
500.0000 mg | ORAL_CAPSULE | Freq: Two times a day (BID) | ORAL | Status: DC
  Administered 2019-04-16 – 2019-04-17 (×2): 500 mg via ORAL
  Filled 2019-04-16 (×3): qty 2

## 2019-04-16 MED ORDER — SODIUM CHLORIDE 0.9 % IV SOLN
INTRAVENOUS | Status: DC
  Administered 2019-04-16: 13:00:00 via INTRAVENOUS

## 2019-04-16 MED ORDER — CLARITHROMYCIN 500 MG PO TABS
500.0000 mg | ORAL_TABLET | Freq: Two times a day (BID) | ORAL | Status: DC
  Administered 2019-04-16 – 2019-04-17 (×2): 500 mg via ORAL
  Filled 2019-04-16 (×2): qty 1

## 2019-04-16 MED ORDER — PANTOPRAZOLE SODIUM 40 MG PO TBEC
40.0000 mg | DELAYED_RELEASE_TABLET | Freq: Two times a day (BID) | ORAL | Status: DC
  Administered 2019-04-16 – 2019-04-17 (×2): 40 mg via ORAL
  Filled 2019-04-16 (×2): qty 1

## 2019-04-16 MED ORDER — SPOT INK MARKER SYRINGE KIT
PACK | SUBMUCOSAL | Status: AC
  Filled 2019-04-16: qty 5

## 2019-04-16 MED ORDER — MEPERIDINE HCL 100 MG/ML IJ SOLN
INTRAMUSCULAR | Status: DC | PRN
  Administered 2019-04-16: 25 mg via INTRAVENOUS

## 2019-04-16 NOTE — Plan of Care (Signed)
Pt's GASTRIC BIOPSIES SHOW H PYLORI GASTRITIS. DISCHARGE ON  AMOXICILLIN 1 GM BID, BIAXIN 500 MG BID, & PROTONIX BID FOR 10 DAYS.  ANEMIA DUE TO H PYLORI GASTRITIS, STERCORAL ULCER, AND COLON POLYPS. OPV W/ DR. FIELDS IN 6 MOS.

## 2019-04-16 NOTE — Progress Notes (Signed)
Nursing Home Choices   [facility] [address] [place], [place] [address] [phone]   [facility]- Opens in a new window 5 out of 5 starsfootnote Much Above Average 5 out of 5 starsfootnote Much Above Average 3 out of 5 starsfootnote Average 3 out of 5 starsfootnote Average 2.9 Miles [facility] [address] [place], [place] [address] [phone]   Add [facility] [place] My [facility]avor[facility]- Opens in a new window 2 out of 5 starsfootnote Below Average 2 out of 5 starsfootnote Below Average 2 out of 5 starsfootnote Below Average 2 out of 5 starsfootnote Below Average 3.0 Miles [facility] [address] [place], [place] [address] [phone]   Add [facility] My Favorites- Opens in a new window 4 out of 5 starsfootnote Above Average 4 out of 5 starsfootnote Above Average 2 out of 5 starsfootnote Below Average 4 out of 5 starsfootnote Above Average 10.7 Miles [facility] [address] [place], [place] [address] [phone]   Add [facility] My Favorites- Opens in a new window 3 out of 5 starsfootnote Average 3 out of 5 starsfootnote Average 2 out of 5 starsfootnote Below Average 3 out of 5 starsfootnote Average 12.5 Miles [facility] [address] [place], [place] [address] [phone]   Add [facility] My Favorites- Opens in a new window 1 out of 5 starsfootnote Much Below Average 2 out of 5 starsfootnote Below Average 1 out of 5 starsfootnote Much Below Average 3 out of 5 starsfootnote Average 14.7 Miles [facility] [address] [place], [place] [address] [phone]   Add [facility] My Favorites- Opens in a new window 4 out of 5 starsfootnote Above Average 3 out of 5 starsfootnote Average 2 out of 5  starsfootnote Below Average 5 out of 5 starsfootnote Much Above Average 22.5 Miles [facility] [address] [place], [place] [address] [phone]   Add [facility] My Favorites- Opens in a new window 1 out of 5 starsfootnote Much Below Average 1 out of 5 starsfootnote Much Below Average 2 out of 5 starsfootnote Below Average 2 out of 5 starsfootnote Below Average 24.2 Miles [facility] [address] [place], [place] [address] [phone]   Add [facility] My Favorites- Opens in a new window 3 out of 5 starsfootnote Average 3 out of 5 starsfootnote Average 2 out of 5 starsfootnote Below Average 2 out of 5 starsfootnote Below Average 24.4 Miles [facility] [address] [place], [place] [address] [phone]   Add [facility] My Favorites- Opens in a new window 1 out of 5 starsfootnote Much Below Average 1 out of 5 starsfootnote Much Below Average 2 out of 5 starsfootnote Below Average 3 out of 5 starsfootnote Average 25.5 Miles [facility] [address] [place], [place] [address] [phone]   Add [facility] My Favorites- Opens in a new window 2 out of 5 starsfootnote Below Average 2 out of 5 starsfootnote Below Average 2 out of 5 starsfootnote Below Average 3 out of 5 starsfootnote Average 25.9 Miles [facility] [place] [address] [place], [place] [address] [phone]   Add [facility] My Favorites-   Opens in a new window 5 out of 5 starsfootnote Much Above Average 5 out of 5 starsfootnote Much Above Average 3 out of 5 starsfootnote Average 2 out of 5 starsfootnote Below Average 26.1 Miles [facility] [address] [place] [address] [phone]   Add [facility] My Favorites- Opens in a new window 3 out of 5 starsfootnote Average 3 out of 5 starsfootnote Average 2 out of 5 starsfootnote Below Average 4 out of 5 starsfootnote Above Average 26.7 Miles [facility] [address] [place] [address] [phone]   Add [facility] My Favorites- Opens in a new window 3 out of 5 starsfootnote Average 3 out of 5 starsfootnote Average 3 out of 5 starsfootnote Average 4 out of 5 starsfootnote Above Average 27.0 Miles [facility] [place] [facility] [address] [place], [place] [address] [phone]   Add [facility] [place] [facility] My Favorites- Opens in a new window 2 out of 5 starsfootnote Below Average 2 out of 5 starsfootnote Below Average 2 out of 5 starsfootnote Below Average 2 out of 5 starsfootnote Below Average 27.0 Miles [facility] [address] [place] [address] [phone]   Add [facility] My Favorites- Opens in a new window 1 out of 5 starsfootnote Much Below Average 1 out of 5 starsfootnote Much Below Average 2 out of 5 starsfootnote Below Average 1 out of 5 starsfootnote Much Below Average 27.1 Miles [facility] [address] [place] [address] [phone]   Add [facility] My Favorites- Opens in a new window 3 out of 5 starsfootnote Average 3 out of 5 starsfootnote Average 2 out of 5 starsfootnote Below Average 4 out of 5 starsfootnote Above Average 27.8 Miles [facility] [address] [place], [place] [address] [phone]   Add [facility] My Favorites- Opens in a new window 5 out of 5 starsfootnote Much Above Average 5 out of 5 starsfootnote Much Above Average 5 out of 5 starsfootnote Much Above  Average 5 out of 5 starsfootnote Much Above Average 28.0 Miles [facility] [address] [place], [place] [address] [phone]   Add [facility] My Favorites- Opens in a new window 5 out of 5 starsfootnote Much Above Average 4 out of 5 starsfootnote Above Average 5 out of 5 starsfootnote Much Above Average 5 out of 5 starsfootnote Much Above Average 28.1 Miles [facility] [address] [place], [place] [address] [phone]   Add [facility] My Favorites- Opens in a new window 5 out of 5 starsfootnote Much Above Average 4 out of 5 starsfootnote Above Average 5 out of 5 starsfootnote Much Above Average 5 out of 5 starsfootnote Much Above Average 28.2 Miles [facility] [address] , [place] [address] [phone]   Add [facility] My Favorites- Opens in a new window 5 out of 5 starsfootnote Much Above Average 4 out of 5 starsfootnote Above Average 5 out of 5 starsfootnote Much Above Average 5 out of 5 starsfootnote Much Above Average 28.3 Miles  

## 2019-04-16 NOTE — Telephone Encounter (Addendum)
CBC/FERRITIN IN DEC 2020. OPV IN [facility] 2021, Dx: H PYLORI GASTRITIS/IRON DEFICIENCY ANEMIA.

## 2019-04-16 NOTE — TOC Progression Note (Signed)
Transition of Care [facility]) - Progression Note    Patient Details  Name: Eric [name] MRN: [id number] Date of Birth: 1945-11-09  [facility]) CM/SW Contact  Boneta Lucks, RN Phone Number: 04/16/2019, 3:55 PM  Clinical Narrative:   Spoke with Tammy at the [facility]. Okay for patient to discharge on Saturday.  Confirmed that negative COVID on 9/6 was sufficient. She approved. MD updated.     Expected Discharge Plan: [place] Barriers to Discharge: No Barriers Identified  Expected Discharge Plan and Services Expected Discharge Plan: [place]   Discharge Planning Services: CM Consult Post Acute Care Choice: [place] arrangements for the past 2 months: [place]: RN, PT [facility] Agency: [place] ([place]) Date [place]: 04/14/19 Time [place]: [id number] Representative spoke with at [place]: [place] ([place]) Interventions    Readmission Risk Interventions No flowsheet data found.

## 2019-04-16 NOTE — Interval H&P Note (Signed)
History and Physical Interval Note:  04/16/2019 1:56 PM  Eric [name]  has presented today for surgery, with the diagnosis of IDA.  The various methods of treatment have been discussed with the patient and family. After consideration of risks, benefits and other options for treatment, the patient has consented to  Procedure(s): COLONOSCOPY (N/A) as a surgical intervention.  The patient's history has been reviewed, patient examined, no change in status, stable for surgery.  I have reviewed the patient's chart and labs.  Questions were answered to the patient's satisfaction.     [employer]

## 2019-04-16 NOTE — Care Management Important Message (Signed)
Important Message  Patient Details  Name: Eric [name] MRN: [id number] Date of Birth: Sep 06, 1945   Medicare Important Message Given:  Yes     [name] 04/16/2019, 12:59 PM

## 2019-04-16 NOTE — Progress Notes (Signed)
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 He has progressively we can having a difficult time having bowel movements.He takes aspirin daily for secondary stroke prevention. He was admitted with anemia Hg 5.6 and stable, chronic GI bleed.  9/9:GI plans for colonoscopy today. PT recommending SNF/rehab that patient insisting on possible home with home health physical therapy.  9/10: GI attempted colonoscopy on 9/9 with large stool burden noted.  Magnesium citrate being attempted for the time being with plans to repeat colonoscopy by 9/11.  Hemoglobin slightly downtrending, but no overt bleeding identified.  PT still recommending SNF on discharge.  9/11: Patient and family agreeable to SNF on discharge which will likely be arranged by 9/12.  Plans for colonoscopy today.  Hemoglobin levels appear to remain stable.  Assessment & Plan:   Principal Problem:   Symptomatic anemia Active Problems:   Lack of coordination   Difficulty walking   Generalized weakness   Essential hypertension   Acute kidney injury (HCC)   Hyperglycemia   Diastolic dysfunction   Heme positive stool   Orthostatic hypotension   Current smoker   Gastrointestinal hemorrhage   Iron deficiency anemia   Acute symptomatic blood loss anemia likely secondary to GI bleed-stable -EGD on 9/8 only reflects mild gastritis with no other stigmata of active bleeding noted -Patient status post 2 unit PRBCs with hemoglobin stable at around 8 -Protonix IV twice daily -Colonoscopy per GI plan for today -Repeat CBC in a.m.  Essential hypertension-controlled -Continue home blood pressure agents as prescribed  AKI-resolved -Continue to monitor  repeat a.m. labs  Hyperglycemia-resolved -Continue to monitor -No history of DM  Generalized weakness -PT recommending SNF rehab -Wife to determine whether patient will come home with home health versus rehab  History of CVA -Holding aspirin for now -Continue atorvastatin  Tobacco abuse -Counseled on cessation -Nicotine patch  DVT prophylaxis:SCDs Code Status:Full Family Communication:None at bedside Disposition Plan:Per GI, plans for colonoscopy in a.m. possible discharge in the next 1 to 2 days to home with home health versus rehab.   Consultants:  GI  Procedures:  EGD 9/8  Antimicrobials:   None   Subjective: Patient seen and evaluated today with no new acute complaints or concerns. No acute concerns or events noted overnight.  Patient has been getting enemas for colonoscopy preparation.  Objective: Vitals:   04/15/19 0537 04/15/19 1323 04/15/19 2130 04/16/19 0654  BP: 126/68 (!) 141/76 130/71 138/64  Pulse: 75 84 65 66  Resp: 17 18 18 20   Temp: 98.1 F (36.7 C) 97.7 F (36.5 C) 97.7 F (36.5 C) 97.8 F (36.6 C)  TempSrc:  Oral Oral Oral  SpO2: 100% 99% 94% 100%  Weight:      Height:        Intake/Output Summary (Last 24 hours) at 04/16/2019 1142 Last data filed at 04/16/2019 0900 Gross per 24 hour  Intake 0 ml  Output 1450 ml  Net -1450 ml   Filed Weights   04/11/19 0706 04/11/19 1044 04/14/19 1411  Weight: 86.2 kg 65.7 kg 63.5 kg    Examination:  General exam: Appears calm and comfortable  Respiratory system: Clear to auscultation. Respiratory effort normal. Cardiovascular system: S1 & S2 heard, RRR. No JVD, murmurs, rubs, gallops or  clicks. No pedal edema. Gastrointestinal system: Abdomen is nondistended, soft and nontender. No organomegaly or masses felt. Normal bowel sounds heard. Central nervous system: Alert and awake Extremities: Symmetric 5 x 5 power. Skin: No rashes, lesions or ulcers Psychiatry: Judgement and  insight appear normal. Mood & affect appropriate.     Data Reviewed: I have personally reviewed following labs and imaging studies  CBC: Recent Labs  Lab 04/11/19 0751 04/12/19 0423 04/13/19 0451 04/14/19 0447 04/15/19 0449 04/16/19 0658  WBC 6.3 5.1 4.3 5.8 5.3 2.9*  NEUTROABS 5.2  --   --   --   --   --   HGB 5.6* 8.2* 8.4* 8.3* 7.8* 7.9*  HCT 23.2* 29.8* 31.6* 31.5* 29.4* 30.0*  MCV 75.6* 79.3* 78.8* 80.4 79.2* 79.2*  PLT 304 247 227 220 228 [id number]   Basic Metabolic Panel: Recent Labs  Lab 04/12/19 0423 04/13/19 0451 04/14/19 0447 04/15/19 0449 04/16/19 0658  NA 137 140 136 140 139  K 3.5 3.8 3.3* 3.9 3.9  CL 104 109 107 112* 109  CO2 26 24 21* 22 25  GLUCOSE 91 86 80 85 87  BUN 13 11 11 10  6*  CREATININE 1.12 0.98 0.86 0.91 0.87  CALCIUM 9.2 9.0 8.6* 8.6* 8.5*  MG 1.9 2.0 1.9  --   --    GFR: Estimated Creatinine Clearance: 65.8 mL/min (by C-G formula based on SCr of 0.87 mg/dL). Liver Function Tests: Recent Labs  Lab 04/11/19 0751  AST 24  ALT 15  ALKPHOS 62  BILITOT 0.4  PROT 8.0  ALBUMIN 4.1   No results for input(s): LIPASE, AMYLASE in the last 168 hours. No results for input(s): AMMONIA in the last 168 hours. Coagulation Profile: No results for input(s): INR, PROTIME in the last 168 hours. Cardiac Enzymes: No results for input(s): CKTOTAL, CKMB, CKMBINDEX, TROPONINI in the last 168 hours. BNP (last 3 results) No results for input(s): PROBNP in the last 8760 hours. HbA1C: No results for input(s): HGBA1C in the last 72 hours. CBG: No results for input(s): GLUCAP in the last 168 hours. Lipid Profile: No results for input(s): CHOL, HDL, LDLCALC, TRIG, CHOLHDL, LDLDIRECT in the last 72 hours. Thyroid Function Tests: No results for input(s): TSH, T4TOTAL, FREET4, T3FREE, THYROIDAB in the last 72 hours. Anemia Panel: No results for input(s): VITAMINB12, FOLATE, FERRITIN, TIBC, IRON, RETICCTPCT in the last 72 hours. Sepsis Labs: No results for  input(s): PROCALCITON, LATICACIDVEN in the last 168 hours.  Recent Results (from the past 240 hour(s))  SARS Coronavirus 2 [facility] order, Performed in [facility] hospital lab) Nasopharyngeal Nasopharyngeal Swab     Status: None   Collection Time: 04/11/19  8:39 AM   Specimen: Nasopharyngeal Swab  Result Value Ref Range Status   SARS Coronavirus 2 NEGATIVE NEGATIVE Final    Comment: (NOTE) If result is NEGATIVE SARS-CoV-2 target nucleic acids are NOT DETECTED. The SARS-CoV-2 RNA is generally detectable in upper and lower  respiratory specimens during the acute phase of infection. The lowest  concentration of SARS-CoV-2 viral copies this assay can detect is 250  copies / mL. A negative result does not preclude SARS-CoV-2 infection  and should not be used as the sole basis for treatment or other  patient management decisions.  A negative result may occur with  improper specimen collection / handling, submission of specimen other  than nasopharyngeal swab, presence of viral mutation(s) within the  areas targeted by this assay, and inadequate number of viral copies  (<250 copies /  mL). A negative result must be combined with clinical  observations, patient history, and epidemiological information. If result is POSITIVE SARS-CoV-2 target nucleic acids are DETECTED. The SARS-CoV-2 RNA is generally detectable in upper and lower  respiratory specimens dur ing the acute phase of infection.  Positive  results are indicative of active infection with SARS-CoV-2.  Clinical  correlation with patient history and other diagnostic information is  necessary to determine patient infection status.  Positive results do  not rule out bacterial infection or co-infection with other viruses. If result is PRESUMPTIVE POSTIVE SARS-CoV-2 nucleic acids MAY BE PRESENT.   A presumptive positive result was obtained on the submitted specimen  and confirmed on repeat testing.  While 2019 novel coronavirus   (SARS-CoV-2) nucleic acids may be present in the submitted sample  additional confirmatory testing may be necessary for epidemiological  and / or clinical management purposes  to differentiate between  SARS-CoV-2 and other Sarbecovirus currently known to infect humans.  If clinically indicated additional testing with an alternate test  methodology [phone]) is advised. The SARS-CoV-2 RNA is generally  detectable in upper and lower respiratory sp ecimens during the acute  phase of infection. The expected result is Negative. Fact Sheet for Patients:  StrictlyIdeas.no Fact Sheet for Healthcare Providers: BankingDealers.co.za This test is not yet approved or cleared by the [place] FDA and has been authorized for detection and/or diagnosis of SARS-CoV-2 by FDA under an Emergency Use Authorization (EUA).  This EUA will remain in effect (meaning this test can be used) for the duration of the COVID-19 declaration under Section 564(b)(1) of the Act, 21 U.S.C. section 360bbb-3(b)(1), unless the authorization is terminated or revoked sooner. Performed at [facility], [address] [place], [place] [address]          Radiology Studies: No results found.      Scheduled Meds: . atorvastatin  40 mg Oral QHS  . pantoprazole  40 mg Oral QAC breakfast  . zolpidem  5 mg Oral Once   Continuous Infusions: . sodium chloride    . 0.9 % NaCl with KCl 40 mEq / L 50 mL/hr (04/15/19 2124)     LOS: 5 days    Time spent: 30 minutes    Denzell Colasanti Darleen Crocker, DO [facility] Pager [phone]  If 7PM-7AM, please contact night-coverage [email or web address] Password [email or web address] 04/16/2019, 11:42 AM

## 2019-04-16 NOTE — Op Note (Signed)
[facility] Patient Name: Eric [name] Procedure Date: 04/16/2019 1:45 PM MRN: [id number] Date of Birth: 1946-04-10 Attending MD: Barney Drain MD, MD CSN: [id number] Age: 73 Admit Type: Outpatient Procedure:                Colonoscopy WITH COLD FORCEPS BIOPSY/SNARE                            POLYPECTOMY/SUBMUCOSAL INJECTION Indications:              Unexplained iron deficiency anemia ON ASA DAILY.                            PMHx: CVA/FECAL IMPACTION Providers:                Barney Drain MD, MD, Charlsie Quest. Theda Sers RN, RN,                            Aram Candela Referring MD:             Beacher May, MD Medicines:                Meperidine 25 mg IV, Midazolam 4 mg IV Complications:            No immediate complications. Estimated Blood Loss:     Estimated blood loss was minimal. Procedure:                Pre-Anesthesia Assessment:                           - Prior to the procedure, a History and Physical                            was performed, and patient medications and                            allergies were reviewed. The patient's tolerance of                            previous anesthesia was also reviewed. The risks                            and benefits of the procedure and the sedation                            options and risks were discussed with the patient.                            All questions were answered, and informed consent                            was obtained. Prior Anticoagulants: The patient has                            taken no previous anticoagulant or antiplatelet  agents except for aspirin. ASA Grade Assessment:                            III - A patient with severe systemic disease. After                            reviewing the risks and benefits, the patient was                            deemed in satisfactory condition to undergo the                            procedure. After obtaining informed consent, the                             colonoscope was passed under direct vision.                            Throughout the procedure, the patient's blood                            pressure, pulse, and oxygen saturations were                            monitored continuously. The [id number]                            scope was introduced through the anus and advanced                            to the 5 cm into the ileum. The terminal ileum,                            ileocecal valve, appendiceal orifice, and rectum                            were photographed. The colonoscopy was performed                            with difficulty due to restricted mobility of the                            colon and significant looping. Successful                            completion of the procedure was aided by increasing                            the dose of sedation medication, changing the                            patient to a supine position, straightening and  shortening the scope to obtain bowel loop reduction                            and COLOWRAP. The patient tolerated the procedure                            well. The quality of the bowel preparation was good. Scope In: 2:08:03 PM Scope Out: 3:10:42 PM Scope Withdrawal Time: 0 hours 48 minutes 1 second  Total Procedure Duration: 1 hour 2 minutes 39 seconds  Findings:      The terminal ileum appeared normal.      Two sessile polyps were found in the sigmoid colon. The polyps were 6 to       15 mm in size. These polyps were removed with a cold snare. Resection       and retrieval were complete. Area was tattooed with an injection of 1 mL       of Spot (carbon black). Area was successfully injected with 1 mL of a       1:10,000 solution of epinephrine for drug delivery. To prevent bleeding       after the polypectomy, one hemostatic clip was successfully placed (MR       conditional). There was no bleeding at the  end of the procedure.      Seven sessile polyps were found in the rectum, hepatic flexure(4),       ascending colon and cecum. The polyps were 3 to 8 mm in size. These       polyps were removed with a cold snare. Resection and retrieval were       complete.      Multiple small and large-mouthed diverticula were found in the       recto-sigmoid colon, sigmoid colon and descending colon.      The recto-sigmoid colon, sigmoid colon, descending colon, splenic       flexure and transverse colon were grossly tortuous.      A continuous area of nonbleeding ulcerated mucosa with no stigmata of       recent bleeding was present in the distal rectum. Biopsies were taken       with a cold forceps for histology. Area was successfully injected with 1       mL of a 1:10,000 solution of epinephrine for hemostasis of bleeding       caused by the procedure. Coagulation for hemostasis of bleeding caused       by the procedure using snare was successful. Impression:               - ANEMIA DUE TO RECTAL ULCER AND COLORECTAL POLYPS                            WHILE TAKING ASPIRIN                           - MODERATE Diverticulosis in the recto-sigmoid                            colon, in the sigmoid colon and in the descending  colon.                           - Tortuous LEFT AND RIGHT colon. Moderate Sedation:      Moderate (conscious) sedation was administered by the endoscopy nurse       and supervised by the endoscopist. The following parameters were       monitored: oxygen saturation, heart rate, blood pressure, and response       to care. Total physician intraservice time was 70 minutes. Recommendation:           - Return patient to hospital ward for ongoing care.                           - Cardiac diet.                           - Continue present medications. NEEDS STIMULANT                            LAXATIVES(COLACE WITH STIMULANT LAXATIVE, DULCOLAX                             5 MG BID) AN OUPT TO PREVENT OBSTIPATION/FECAL                            IMPACTION.                           - Await pathology results.                           - Repeat colonoscopy is not recommended due to                            current age (85 years or older) for surveillance.                           - Return to GI office in 6 months. Procedure Code(s):        --- Professional ---                           [phone], Colonoscopy, flexible; with removal of                            tumor(s), polyp(s), or other lesion(s) by snare                            technique                           [id number], Colonoscopy, flexible; with directed                            submucosal injection(s), any substance                           [id number], 59,  Colonoscopy, flexible; with biopsy,                            single or multiple                           99153, Moderate sedation; each additional 15                            minutes intraservice time                           99153, Moderate sedation; each additional 15                            minutes intraservice time                           99153, Moderate sedation; each additional 15                            minutes intraservice time                           99153, Moderate sedation; each additional 15                            minutes intraservice time                           G0500, Moderate sedation services provided by the                            same physician or other qualified health care                            professional performing a gastrointestinal                            endoscopic service that sedation supports,                            requiring the presence of an independent trained                            observer to assist in the monitoring of the                            patient's level of consciousness and physiological                            status; initial 15 minutes of intra-service  time;                            patient age 45 years or older (additional time 4  be reported with [phone], as appropriate) Diagnosis Code(s):        --- Professional ---                           K62.1, Rectal polyp                           K63.5, Polyp of colon                           D50.9, Iron deficiency anemia, unspecified                           K57.30, Diverticulosis of large intestine without                            perforation or abscess without bleeding                           Q43.8, Other specified congenital malformations of                            intestine CPT copyright 2019 [employer]. All rights reserved. The codes documented in this report are preliminary and upon coder review may  be revised to meet current compliance requirements. Barney Drain, MD Barney Drain MD, MD 04/16/2019 3:32:24 PM This report has been signed electronically. Number of Addenda: 0

## 2019-04-17 ENCOUNTER — Inpatient Hospital Stay
Admission: RE | Admit: 2019-04-17 | Discharge: 2019-05-05 | Disposition: A | Discharge status: Home / Self Care | Payer: Medicare Other | Source: Ambulatory Visit | Attending: Internal Medicine | Admitting: Internal Medicine

## 2019-04-17 LAB — CBC
HCT: 30.7 % — ABNORMAL LOW (ref 39.0–52.0)
Hemoglobin: 8.1 g/dL — ABNORMAL LOW (ref 13.0–17.0)
MCH: 20.8 pg — ABNORMAL LOW (ref 26.0–34.0)
MCHC: 26.4 g/dL — ABNORMAL LOW (ref 30.0–36.0)
MCV: 78.9 fL — ABNORMAL LOW (ref 80.0–100.0)
Platelets: 209 10*3/uL (ref 150–400)
RBC: 3.89 MIL/uL — ABNORMAL LOW (ref 4.22–5.81)
RDW: 21.2 % — ABNORMAL HIGH (ref 11.5–15.5)
WBC: 3.4 10*3/uL — ABNORMAL LOW (ref 4.0–10.5)
nRBC: 0 % (ref 0.0–0.2)

## 2019-04-17 LAB — BASIC METABOLIC PANEL WITH GFR
Anion gap: 7 (ref 5–15)
BUN: 7 mg/dL — ABNORMAL LOW (ref 8–23)
CO2: 24 mmol/L (ref 22–32)
Calcium: 8.5 mg/dL — ABNORMAL LOW (ref 8.9–10.3)
Chloride: 108 mmol/L (ref 98–111)
Creatinine, Ser: 1 mg/dL (ref 0.61–1.24)
GFR calc Af Amer: >60 mL/min
GFR calc non Af Amer: >60 mL/min
Glucose, Bld: 85 mg/dL (ref 70–99)
Potassium: 3.9 mmol/L (ref 3.5–5.1)
Sodium: 139 mmol/L (ref 135–145)

## 2019-04-17 MED ORDER — PANTOPRAZOLE SODIUM 40 MG PO TBEC: 40.0000 mg | DELAYED_RELEASE_TABLET | Freq: Two times a day (BID) | ORAL | 0 refills | Status: DC

## 2019-04-17 MED ORDER — FERROUS SULFATE 325 (65 FE) MG PO TABS: 325.0000 mg | ORAL_TABLET | Freq: Every day | ORAL | 3 refills | Status: DC

## 2019-04-17 MED ORDER — AMOXICILLIN 500 MG PO CAPS: 500.0000 mg | ORAL_CAPSULE | Freq: Two times a day (BID) | ORAL | 0 refills | Status: DC

## 2019-04-17 MED ORDER — CLARITHROMYCIN 500 MG PO TABS: 500.0000 mg | ORAL_TABLET | Freq: Two times a day (BID) | ORAL | 0 refills | Status: DC

## 2019-04-17 NOTE — Discharge Summary (Signed)
Physician Discharge Summary  Eric [name] [id number] DOB: [birth year] DOA: 04/11/2019  PCP: Beacher May, MD  Admit date: 04/11/2019  Discharge date: 04/17/2019  Admitted From:Home  Disposition:  SNF [facility]  Recommendations for Outpatient Follow-up:  1. Follow up with PCP in 1-2 weeks 2. Follow-up with GI in 6 months as recommended 3. Continue treatment for H. pylori with Amoxil, Biaxin, and Protonix for the next 10 days as prescribed 4. 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 He has progressively we can having a difficult time having bowel movements.He takes aspirin daily for secondary stroke prevention. He was admitted with anemia Hg 5.6 and stable, chronic GI bleed. He had EGD on 9/8 with noted mild gastritis and no other stigmata of active bleeding.  9/9:GI plans for colonoscopy today. PT recommending SNF/rehab that patient insisting on possible home with home health physical therapy.  9/10:GI attempted colonoscopy on 9/9 with large stool burden noted.Magnesium citrate being attempted for the time being with plans to repeat colonoscopy by 9/11. Hemoglobin slightly downtrending, but no overt bleeding identified. PT still recommending SNF on discharge.  9/11: Patient and family agreeable to SNF on discharge which will likely be arranged by 9/12.  Plans for colonoscopy today.  Hemoglobin levels appear to remain stable.  9/12: Patient underwent colonoscopy on 9/11 with findings of stercoral ulcer.  He is also had prior EGD with findings of H. pylori gastritis on  testing from EGD 9/8 and has been started on treatment with amoxicillin, Biaxin, and Protonix for 10 days.  He is noted to have iron deficiency anemia and will remain on iron supplementation as well.  He will have outpatient visits with Dr. Oneida Alar in December this year for recheck on CBC and ferritin levels as well as a visit on 73/2020.  Hemoglobin levels remained stable with no further overt bleeding noted.  He is otherwise stable for discharge and has been recommended rehabilitation by physical therapy and will be discharged to rehab facility today.  Discharge Diagnoses:  Principal Problem:   Symptomatic anemia Active Problems:   Lack of coordination   Difficulty walking   Generalized weakness   Essential hypertension   Acute kidney injury ([place])   Hyperglycemia   Diastolic dysfunction   Heme positive stool   Orthostatic hypotension   Current smoker   Gastrointestinal hemorrhage   Iron deficiency anemia  Principal discharge diagnosis: Acute symptomatic blood loss anemia with likely bleed related to H. pylori gastritis as well as stercoral ulcer.  Also has iron deficiency anemia.  Discharge Instructions  Discharge Instructions    Diet - low sodium heart healthy   Complete by: As directed    Increase activity slowly   Complete by: As directed      Allergies as of 04/17/2019   No Known Allergies     Medication List    TAKE these medications   amoxicillin 500 MG capsule Commonly known as: AMOXIL Take 1 capsule (500 mg total) by mouth 2 (two) times daily with a meal for 10 days.   aspirin EC 81 MG tablet Take 81 mg by mouth daily.   atorvastatin 80 MG tablet Commonly known  as: LIPITOR Take 40 mg by mouth at bedtime.   clarithromycin 500 MG tablet Commonly known as: BIAXIN Take 1 tablet (500 mg total) by mouth 2 (two) times daily with a meal for 10 days.   docusate sodium 100 MG capsule Commonly known as: COLACE Take 100 mg by mouth daily.   ferrous sulfate 325 (65  FE) MG tablet Take 1 tablet (325 mg total) by mouth daily.   hydrochlorothiazide 25 MG tablet Commonly known as: HYDRODIURIL Take 25 mg by mouth daily.   pantoprazole 40 MG tablet Commonly known as: PROTONIX Take 1 tablet (40 mg total) by mouth 2 (two) times daily before a meal for 10 days.       Contact information for follow-up providers    [facility] up.   Why: home health PT and RN Contact information: [address] [place]       Beacher May, MD Follow up in 2 week(s).   Specialty: Internal Medicine Contact information: [place] [address] [phone]        Danie Binder, MD Follow up in 6 month(s).   Specialty: Gastroenterology Contact information: [address] [place] [address] [phone]            Contact information for after-discharge care    [facility] .   Service: Skilled Nursing Contact information: [address] [place] [phone]                 No Known Allergies  Consultations:  GI   Procedures/Studies: Dg Abdomen 1 View  Result Date: 04/11/2019 CLINICAL DATA:  Abdominal distension. EXAM: ABDOMEN - 1 VIEW COMPARISON:  Chest and two views abdomen 08/24/2009. FINDINGS: The bowel gas pattern is normal. No radio-opaque calculi or other significant radiographic abnormality are seen. Multilevel lumbar spondylosis is identified. Convex left thoracolumbar curvature. IMPRESSION: No acute finding. Electronically Signed   By: Inge Rise M.D.   On: 04/11/2019 08:31     Discharge Exam: Vitals:   04/16/19 2113 04/17/19 0600  BP: 135/73 125/61  Pulse: 86 65  Resp: 18 18  Temp: 98.1 F (36.7 C) 98.6 F (37 C)  SpO2: 98% 98%   Vitals:   04/16/19 1505 04/16/19 1510 04/16/19 2113 04/17/19 0600  BP: (!) 147/76 (!) 149/75 135/73 125/61  Pulse: 84 81 86 65   Resp: (!) 32 (!) 21 18 18   Temp:   98.1 F (36.7 C) 98.6 F (37 C)  TempSrc:    Oral  SpO2: 100% 100% 98% 98%  Weight:      Height:        General: Pt is alert, awake, not in acute distress Cardiovascular: RRR, S1/S2 +, no rubs, no gallops Respiratory: CTA bilaterally, no wheezing, no rhonchi Abdominal: Soft, NT, ND, bowel sounds + Extremities: no edema, no cyanosis    The results of significant diagnostics from this hospitalization (including imaging, microbiology, ancillary and laboratory) are listed below for reference.     Microbiology: Recent Results (from the past 240 hour(s))  SARS Coronavirus 2 [facility] order, Performed in [facility] hospital lab) Nasopharyngeal Nasopharyngeal Swab     Status: None   Collection Time: 04/11/19  8:39 AM   Specimen: Nasopharyngeal Swab  Result Value Ref Range Status   SARS Coronavirus 2 NEGATIVE NEGATIVE Final    Comment: (NOTE) If result is NEGATIVE SARS-CoV-2 target nucleic acids  are NOT DETECTED. The SARS-CoV-2 RNA is generally detectable in upper and lower  respiratory specimens during the acute phase of infection. The lowest  concentration of SARS-CoV-2 viral copies this assay can detect is 250  copies / mL. A negative result does not preclude SARS-CoV-2 infection  and should not be used as the sole basis for treatment or other  patient management decisions.  A negative result may occur with  improper specimen collection / handling, submission of specimen other  than nasopharyngeal swab, presence of viral mutation(s) within the  areas targeted by this assay, and inadequate number of viral copies  (<250 copies / mL). A negative result must be combined with clinical  observations, patient history, and epidemiological information. If result is POSITIVE SARS-CoV-2 target nucleic acids are DETECTED. The SARS-CoV-2 RNA is generally detectable in upper and lower  respiratory specimens dur ing the acute phase of infection.   Positive  results are indicative of active infection with SARS-CoV-2.  Clinical  correlation with patient history and other diagnostic information is  necessary to determine patient infection status.  Positive results do  not rule out bacterial infection or co-infection with other viruses. If result is PRESUMPTIVE POSTIVE SARS-CoV-2 nucleic acids MAY BE PRESENT.   A presumptive positive result was obtained on the submitted specimen  and confirmed on repeat testing.  While 2019 novel coronavirus  (SARS-CoV-2) nucleic acids may be present in the submitted sample  additional confirmatory testing may be necessary for epidemiological  and / or clinical management purposes  to differentiate between  SARS-CoV-2 and other Sarbecovirus currently known to infect humans.  If clinically indicated additional testing with an alternate test  methodology [phone]) is advised. The SARS-CoV-2 RNA is generally  detectable in upper and lower respiratory sp ecimens during the acute  phase of infection. The expected result is Negative. Fact Sheet for Patients:  StrictlyIdeas.no Fact Sheet for Healthcare Providers: BankingDealers.co.za This test is not yet approved or cleared by the [place] FDA and has been authorized for detection and/or diagnosis of SARS-CoV-2 by FDA under an Emergency Use Authorization (EUA).  This EUA will remain in effect (meaning this test can be used) for the duration of the COVID-19 declaration under Section 564(b)(1) of the Act, 21 U.S.C. section 360bbb-3(b)(1), unless the authorization is terminated or revoked sooner. Performed at [facility], [address], [place], [place] [address]      Labs: BNP (last 3 results) No results for input(s): BNP in the last 8760 hours. Basic Metabolic Panel: Recent Labs  Lab 04/12/19 0423 04/13/19 0451 04/14/19 0447 04/15/19 0449 04/16/19 0658 04/17/19 0649  NA 137 140 136 140 139  139  K 3.5 3.8 3.3* 3.9 3.9 3.9  CL 104 109 107 112* 109 108  CO2 26 24 21* 22 25 24   GLUCOSE 91 86 80 85 87 85  BUN 13 11 11 10  6* 7*  CREATININE 1.12 0.98 0.86 0.91 0.87 1.00  CALCIUM 9.2 9.0 8.6* 8.6* 8.5* 8.5*  MG 1.9 2.0 1.9  --   --   --    Liver Function Tests: Recent Labs  Lab 04/11/19 0751  AST 24  ALT 15  ALKPHOS 62  BILITOT 0.4  PROT 8.0  ALBUMIN 4.1   No results for input(s): LIPASE, AMYLASE in the last 168 hours. No results for input(s): AMMONIA in the last 168 hours. CBC: Recent Labs  Lab 04/11/19 0751  04/13/19 0451 04/14/19 0447 04/15/19 0449 04/16/19 0658 04/17/19 0649  WBC 6.3   < >  4.3 5.8 5.3 2.9* 3.4*  NEUTROABS 5.2  --   --   --   --   --   --   HGB 5.6*   < > 8.4* 8.3* 7.8* 7.9* 8.1*  HCT 23.2*   < > 31.6* 31.5* 29.4* 30.0* 30.7*  MCV 75.6*   < > 78.8* 80.4 79.2* 79.2* 78.9*  PLT 304   < > 227 220 228 215 209   < > = values in this interval not displayed.   Cardiac Enzymes: No results for input(s): CKTOTAL, CKMB, CKMBINDEX, TROPONINI in the last 168 hours. BNP: Invalid input(s): POCBNP CBG: No results for input(s): GLUCAP in the last 168 hours. D-Dimer No results for input(s): DDIMER in the last 72 hours. Hgb A1c No results for input(s): HGBA1C in the last 72 hours. Lipid Profile No results for input(s): CHOL, HDL, LDLCALC, TRIG, CHOLHDL, LDLDIRECT in the last 72 hours. Thyroid function studies No results for input(s): TSH, T4TOTAL, T3FREE, THYROIDAB in the last 72 hours.  Invalid input(s): FREET3 Anemia work up No results for input(s): VITAMINB12, FOLATE, FERRITIN, TIBC, IRON, RETICCTPCT in the last 72 hours. Urinalysis    Component Value Date/Time   COLORURINE YELLOW 04/11/2019 0735   APPEARANCEUR HAZY (A) 04/11/2019 0735   LABSPEC 1.015 04/11/2019 0735   PHURINE 6.0 04/11/2019 0735   GLUCOSEU NEGATIVE 04/11/2019 0735   HGBUR NEGATIVE 04/11/2019 0735   BILIRUBINUR NEGATIVE 04/11/2019 0735   KETONESUR NEGATIVE 04/11/2019 0735    PROTEINUR NEGATIVE 04/11/2019 0735   UROBILINOGEN 2.0 (H) 01/29/2015 1108   NITRITE NEGATIVE 04/11/2019 0735   LEUKOCYTESUR NEGATIVE 04/11/2019 0735   Sepsis Labs Invalid input(s): PROCALCITONIN,  WBC,  LACTICIDVEN Microbiology Recent Results (from the past 240 hour(s))  SARS Coronavirus 2 [facility] order, Performed in [facility] hospital lab) Nasopharyngeal Nasopharyngeal Swab     Status: None   Collection Time: 04/11/19  8:39 AM   Specimen: Nasopharyngeal Swab  Result Value Ref Range Status   SARS Coronavirus 2 NEGATIVE NEGATIVE Final    Comment: (NOTE) If result is NEGATIVE SARS-CoV-2 target nucleic acids are NOT DETECTED. The SARS-CoV-2 RNA is generally detectable in upper and lower  respiratory specimens during the acute phase of infection. The lowest  concentration of SARS-CoV-2 viral copies this assay can detect is 250  copies / mL. A negative result does not preclude SARS-CoV-2 infection  and should not be used as the sole basis for treatment or other  patient management decisions.  A negative result may occur with  improper specimen collection / handling, submission of specimen other  than nasopharyngeal swab, presence of viral mutation(s) within the  areas targeted by this assay, and inadequate number of viral copies  (<250 copies / mL). A negative result must be combined with clinical  observations, patient history, and epidemiological information. If result is POSITIVE SARS-CoV-2 target nucleic acids are DETECTED. The SARS-CoV-2 RNA is generally detectable in upper and lower  respiratory specimens dur ing the acute phase of infection.  Positive  results are indicative of active infection with SARS-CoV-2.  Clinical  correlation with patient history and other diagnostic information is  necessary to determine patient infection status.  Positive results do  not rule out bacterial infection or co-infection with other viruses. If result is PRESUMPTIVE  POSTIVE SARS-CoV-2 nucleic acids MAY BE PRESENT.   A presumptive positive result was obtained on the submitted specimen  and confirmed on repeat testing.  While 2019 novel coronavirus  (SARS-CoV-2) nucleic acids may be present in  the submitted sample  additional confirmatory testing may be necessary for epidemiological  and / or clinical management purposes  to differentiate between  SARS-CoV-2 and other Sarbecovirus currently known to infect humans.  If clinically indicated additional testing with an alternate test  methodology [phone]) is advised. The SARS-CoV-2 RNA is generally  detectable in upper and lower respiratory sp ecimens during the acute  phase of infection. The expected result is Negative. Fact Sheet for Patients:  StrictlyIdeas.no Fact Sheet for Healthcare Providers: BankingDealers.co.za This test is not yet approved or cleared by the [place] FDA and has been authorized for detection and/or diagnosis of SARS-CoV-2 by FDA under an Emergency Use Authorization (EUA).  This EUA will remain in effect (meaning this test can be used) for the duration of the COVID-19 declaration under Section 564(b)(1) of the Act, 21 U.S.C. section 360bbb-3(b)(1), unless the authorization is terminated or revoked sooner. Performed at  [facility], [address] [place], [place] [address]      Time coordinating discharge: 40 minutes  SIGNED:   Rodena Goldmann, DO [facility] 04/17/2019, 9:26 AM  If 7PM-7AM, please contact night-coverage [email or web address] Password [email or web address]

## 2019-04-17 NOTE — TOC Transition Note (Signed)
Transition of Care [facility]) - CM/SW Discharge Note   Patient Details  Name: ADEEL LOUD MRN: [id number] Date of Birth: Apr 20, 1946  [facility] CM/SW Contact:  Latanya Maudlin, RN Phone Number: 04/17/2019, 11:12 AM   Clinical Narrative:  Patient medically cleared for discharge. Had previously accepted bed at [facility]. Spoke with admission RN and bed assignment has been made. Dc summary and negative COVID faxed through the HUB and manually per their request. [facility] will provide transport.      Final next level of care: Skilled Nursing Facility Barriers to Discharge: No Barriers Identified   Patient Goals and CMS Choice Patient states their goals for this hospitalization and ongoing recovery are:: patient wishes to return home and wife agrees CMS Medicare.gov Compare Post Acute Care list provided to:: Patient Choice offered to / list presented to : Patient, Spouse  Discharge Placement                       Discharge Plan and Services   Discharge Planning Services: CM Consult Post Acute Care Choice: Home Health                    HH Arranged: RN, PT [facility] Agency: [place] ([facility]) Date [place]: 04/14/19 Time [place]: [id number] Representative spoke with at [place]: [place] ([place]) Interventions     Readmission Risk Interventions Readmission Risk Prevention Plan 04/17/2019  Post Dischage Appt Complete  Medication Screening Complete  Transportation Screening Complete  Some recent data might be hidden

## 2019-04-19 ENCOUNTER — Encounter: Payer: Self-pay | Admitting: Adult Health

## 2019-04-19 ENCOUNTER — Other Ambulatory Visit: Payer: Self-pay

## 2019-04-19 ENCOUNTER — Encounter: Payer: Self-pay | Admitting: Gastroenterology

## 2019-04-19 ENCOUNTER — Non-Acute Institutional Stay (SKILLED_NURSING_FACILITY): Payer: Medicare Other | Admitting: Adult Health

## 2019-04-19 DIAGNOSIS — G8191 Hemiplegia, unspecified affecting right dominant side: Secondary | ICD-10-CM | POA: Insufficient documentation

## 2019-04-19 DIAGNOSIS — E538 Deficiency of other specified B group vitamins: Secondary | ICD-10-CM

## 2019-04-19 DIAGNOSIS — B9681 Helicobacter pylori [H. pylori] as the cause of diseases classified elsewhere: Secondary | ICD-10-CM

## 2019-04-19 DIAGNOSIS — K2901 Acute gastritis with bleeding: Secondary | ICD-10-CM | POA: Diagnosis not present

## 2019-04-19 DIAGNOSIS — D509 Iron deficiency anemia, unspecified: Secondary | ICD-10-CM

## 2019-04-19 DIAGNOSIS — K626 Ulcer of anus and rectum: Secondary | ICD-10-CM

## 2019-04-19 DIAGNOSIS — I6350 Cerebral infarction due to unspecified occlusion or stenosis of unspecified cerebral artery: Secondary | ICD-10-CM | POA: Diagnosis not present

## 2019-04-19 DIAGNOSIS — E785 Hyperlipidemia, unspecified: Secondary | ICD-10-CM | POA: Insufficient documentation

## 2019-04-19 DIAGNOSIS — I10 Essential (primary) hypertension: Secondary | ICD-10-CM | POA: Diagnosis not present

## 2019-04-19 DIAGNOSIS — K2970 Gastritis, unspecified, without bleeding: Secondary | ICD-10-CM

## 2019-04-19 DIAGNOSIS — D500 Iron deficiency anemia secondary to blood loss (chronic): Secondary | ICD-10-CM

## 2019-04-19 DIAGNOSIS — R195 Other fecal abnormalities: Secondary | ICD-10-CM

## 2019-04-19 DIAGNOSIS — I679 Cerebrovascular disease, unspecified: Secondary | ICD-10-CM | POA: Insufficient documentation

## 2019-04-19 DIAGNOSIS — K5909 Other constipation: Secondary | ICD-10-CM

## 2019-04-19 NOTE — Telephone Encounter (Signed)
PATIENT SCHEDULED  °

## 2019-04-19 NOTE — Telephone Encounter (Signed)
Lab orders on file for 07/2019.

## 2019-04-19 NOTE — Progress Notes (Signed)
Location:    [place] Room Number: 160/P Place of Service:  SNF (31)   CODE STATUS: Full Code  No Known Allergies  Chief Complaint  Patient presents with  . Hospitalization Follow-up    Hospitalization Follow Up    HPI:  He is a 73 year old man who has been hospitalized from 04-11-19 through 04-17-19. He was brought to the ED after a night of wandering by his family he had been having  progressively worsening constipation.  He was admitted with a hgb of 5.6 he did require transfusion. He did have an egd with demonstrated mild gastritis. He had an colonoscopy demonstrated rectal ulcerations and polyps. He is here for short term rehab with his goal to return back home with his wife. He denies any pain; no diarrhea; no heart burn; no uncontrolled pain. He will continue to be followed for his chronic illnesses including: cva; hypertension; dyslipidemia.   Past Medical History:  Diagnosis Date  . Abnormal EKG 01/29/2015   RBBB and left fascicular block  . Anemia 06/04/2010   Qualifier: Diagnosis of  By: Oneida Alar MD, Sandi L   . Arthritis   . Cerebral artery occlusion with cerebral infarction ([place]) 07/15/2011  . Constipation   . Diastolic dysfunction 123456   Grade 1. EF 65-70%  . Hepatitis C antibody test positive 02/01/2015  . HTN (hypertension)   . Leucopenia 01/30/2015  . Stiffness of joints, not elsewhere classified, multiple sites 11/04/2013  . Stroke ([place])   . Thrombocytopenia ([place]) 01/30/2015    Past Surgical History:  Procedure Laterality Date  . COLONOSCOPY  07/2010   FOR ANEMIA-DIVERTICULOSIS  . ESOPHAGOGASTRODUODENOSCOPY  07/2010   H PYLORI GASTRITIS  . ESOPHAGOGASTRODUODENOSCOPY (EGD) WITH PROPOFOL N/A 04/13/2019   Procedure: ESOPHAGOGASTRODUODENOSCOPY (EGD) WITH PROPOFOL;  Surgeon: Danie Binder, MD;  [facility];  Service: Endoscopy;  Laterality: N/A;  . IMPACTION REMOVAL  04/14/2019   Procedure: IMPACTION REMOVAL;  Surgeon: Danie Binder, MD;  Location: [facility];  Service: Endoscopy;;  COLON    Social History   Socioeconomic History  . Marital status: Married    Spouse name: Not on file  . Number of children: Not on file  . Years of education: Not on file  . Highest education level: Not on file  Occupational History  . Not on file  Social Needs  . Financial resource strain: Not on file  . Food insecurity    Worry: Not on file    Inability: Not on file  . Transportation needs    Medical: Not on file    Non-medical: Not on file  Tobacco Use  . Smoking status: Current Every Day Smoker    Packs/day: 1.00    Types: Cigarettes  . Smokeless tobacco: Never Used  Substance and Sexual Activity  . Alcohol use: No  . Drug use: No  . Sexual activity: Never  Lifestyle  . Physical activity    Days per week: Not on file    Minutes per session: Not on file  . Stress: Not on file  Relationships  . Social Herbalist on phone: Not on file    Gets together: Not on file    Attends religious service: Not on file    Active member of club or organization: Not on file    Attends meetings of clubs or organizations: Not on file    Relationship status: Not on file  . Intimate partner violence  Fear of current or ex partner: Not on file    Emotionally abused: Not on file    Physically abused: Not on file    Forced sexual activity: Not on file  Other Topics Concern  . 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 SPENDS FREE TIME: [employer]. BIBLE STUDY. WIFE DOES SHOPPING & DRIVING. [facility].   Family History  Problem Relation Age of Onset  . Colon polyps Neg Hx   . Colon cancer Neg Hx       VITAL SIGNS BP 124/67   Pulse 74   Temp (!) 97.4 F (36.3 C) (Oral)   Resp 18   Wt 149 lb 8 oz (67.8 kg)   BMI 24.88 kg/m   Outpatient Encounter Medications as of 04/19/2019  Medication Sig  . amoxicillin (AMOXIL)  500 MG capsule Take 1 capsule (500 mg total) by mouth 2 (two) times daily with a meal for 10 days.  Marland Kitchen aspirin EC 81 MG tablet Take 81 mg by mouth daily.  Marland Kitchen atorvastatin (LIPITOR) 80 MG tablet Take 40 mg by mouth at bedtime.  . clarithromycin (BIAXIN) 500 MG tablet Take 1 tablet (500 mg total) by mouth 2 (two) times daily with a meal for 10 days.  . cyanocobalamin 1000 MCG tablet Take 1,000 mcg by mouth daily.  Marland Kitchen docusate sodium (COLACE) 100 MG capsule Take 100 mg by mouth daily.  . ferrous sulfate 325 (65 FE) MG tablet Take 1 tablet (325 mg total) by mouth daily.  . hydrochlorothiazide (HYDRODIURIL) 25 MG tablet Take 25 mg by mouth daily.  . NON FORMULARY Diet - Liquids: __X_Regular;  Diet: Regular, NAS, Consistent Carbohydrate  . pantoprazole (PROTONIX) 40 MG tablet Take 1 tablet (40 mg total) by mouth 2 (two) times daily before a meal for 10 days.   No facility-administered encounter medications on file as of 04/19/2019.      SIGNIFICANT DIAGNOSTIC EXAMS  TODAY;   04-11-19: KUB no acute findings.   04-13-19: EGD: Benign-appearing esophageal STRICTURE   Medium-sized hiatal hernia.  MILD Gastritis. Biopsied.  04-14-19: colonoscopy: - ANEMIA DUE TO RECTAL ULCER AND COLORECTAL POLYPS WHILE TAKING ASPIRIN - MODERATE Diverticulosis in the recto-sigmoid colon, in the sigmoid colon and in thevdescending colon. - Tortuous LEFT AND RIGHT colon.  LABS REVIEWED TODAY:   04-11-19: wbc 6.3; hgb 5.6; hct 23.2; mcv 75.6 ;plt 304; glucose 144; bun 20; creat 1.42; k+ 3.6; na++ 136; ca 9.6 liver normal albumin 4.1 tsh 4.166; vit B 12: 204; iron 8 tibc 393; ferritin 4  04-13-19: wbc 4.2; hgb 8.4; hct 31.6; mcv 78.8; plt 227; glucose 86; bun 11; creat 0.98; k+ 3.8; na++ 140; ca 9.0; mag 2.0 04-15-19: wbc 4.3; hgb 8.4; hct 31.6 mcv 78.8; plt 227 glucose 85; bun 10; creat 0.91; k+ 3.9; na++ 140; ca 8.6 04-17-19: wbc 3.4; hgb 8.1; hct 30.7; mcv 78.9 plt 209; glucose 85; bun 7; creat 1.00; k+ 3.9; na++ 139; ca 8.5     Review of Systems  Constitutional: Negative for malaise/fatigue.  Respiratory: Negative for cough and shortness of breath.   Cardiovascular: Negative for chest pain, palpitations and leg swelling.  Gastrointestinal: Negative for abdominal pain, constipation and heartburn.  Musculoskeletal: Negative for back pain, joint pain and myalgias.  Skin: Negative.   Neurological: Negative for dizziness.  Psychiatric/Behavioral: The patient is not nervous/anxious.      Physical Exam Constitutional:  General: He is not in acute distress.    Appearance: He is well-developed. He is not diaphoretic.  Neck:     Musculoskeletal: Neck supple.     Thyroid: No thyromegaly.  Cardiovascular:     Rate and Rhythm: Normal rate and regular rhythm.     Pulses: Normal pulses.     Heart sounds: Normal heart sounds.  Pulmonary:     Effort: Pulmonary effort is normal. No respiratory distress.     Breath sounds: Normal breath sounds.  Abdominal:     General: Bowel sounds are normal. There is no distension.     Palpations: Abdomen is soft.     Tenderness: There is no abdominal tenderness.  Musculoskeletal:     Right lower leg: No edema.     Left lower leg: No edema.     Comments: Is able to move all extremities  Right side hemiparesis   Lymphadenopathy:     Cervical: No cervical adenopathy.  Skin:    General: Skin is warm and dry.  Neurological:     Mental Status: He is alert. Mental status is at baseline.     Comments: Has mild expressive aphasia  Psychiatric:        Mood and Affect: Mood normal.     ASSESSMENT/ PLAN:  TODAY:  1.  Essential hypertension: is stable b/p 124/67 will continue hctz 25 mg daily   2. Cerebral artery occlusion with cerebral infarction/hemiparesis of right dominant side due to cerebrovascular disease:is neurologically stable will continue asa 81 mg daily   3. Iron deficiency anemia due to chronic blood loss: is stable hgb 8.1 will continue iron daily; is status  post blood transfusion in the hospital  4. Dyslipidemia: is stable will continue lipitor 40 mg daily   5. Chronic constipation: will continue colace daily   6. Vitamin B 12 deficiency: is without change b12: 209 will begin vit b12 1000 mcg daily   7.  Stercoral ulcer of rectum/gastritis helicobacter pylori/heme positive stools: is stable will continue amoxil 500 mg twice daily biaxin 500 mg twice daily and protonix 40 mg twice daily for 10 days. Will monitor   On 04-23-19: will check cbc   MD is aware of resident's narcotic use and is in agreement with current plan of care. We will attempt to wean resident as appropriate.  Ok Edwards NP [facility]  Contact [phone] Monday through Friday 8am- 5pm  After hours call [phone]

## 2019-04-21 ENCOUNTER — Encounter: Payer: Self-pay | Admitting: Adult Health

## 2019-04-21 ENCOUNTER — Non-Acute Institutional Stay (SKILLED_NURSING_FACILITY): Payer: Medicare Other | Admitting: Adult Health

## 2019-04-21 DIAGNOSIS — K626 Ulcer of anus and rectum: Secondary | ICD-10-CM | POA: Diagnosis not present

## 2019-04-21 DIAGNOSIS — D500 Iron deficiency anemia secondary to blood loss (chronic): Secondary | ICD-10-CM | POA: Diagnosis not present

## 2019-04-21 DIAGNOSIS — G8191 Hemiplegia, unspecified affecting right dominant side: Secondary | ICD-10-CM

## 2019-04-21 DIAGNOSIS — I679 Cerebrovascular disease, unspecified: Secondary | ICD-10-CM | POA: Diagnosis not present

## 2019-04-21 NOTE — Progress Notes (Signed)
Location:    [place] Room Number: 160/P Place of Service:  SNF (31)   CODE STATUS: Full Code  No Known Allergies  Chief Complaint  Patient presents with  . Acute Visit    72 Hour Care Plan Meeting     HPI:  We have come together for his 26 hour care plan meeting. He lives with his wife. He is confused; has had one fall since his admission. There was no injury. He is ambulating. His goal is to return back home. There are no reports of changes in appetite; no agitation no uncontrolled pain. He will not need dme at this time.   Past Medical History:  Diagnosis Date  . Abnormal EKG 01/29/2015   RBBB and left fascicular block  . Anemia 06/04/2010   Qualifier: Diagnosis of  By: Oneida Alar MD, Sandi L   . Arthritis   . Cerebral artery occlusion with cerebral infarction ([place]) 07/15/2011  . Constipation   . Diastolic dysfunction 123456   Grade 1. EF 65-70%  . Hepatitis C antibody test positive 02/01/2015  . HTN (hypertension)   . Leucopenia 01/30/2015  . Stiffness of joints, not elsewhere classified, multiple sites 11/04/2013  . Stroke ([place])   . Thrombocytopenia ([place]) 01/30/2015    Past Surgical History:  Procedure Laterality Date  . BIOPSY  04/16/2019   Procedure: BIOPSY;  Surgeon: Danie Binder, MD;  [facility];  Service: Endoscopy;;  . COLONOSCOPY  07/2010   FOR ANEMIA-DIVERTICULOSIS  . COLONOSCOPY N/A 04/16/2019   Procedure: COLONOSCOPY;  Surgeon: Danie Binder, MD;  [facility];  Service: Endoscopy;  Laterality: N/A;  . ESOPHAGOGASTRODUODENOSCOPY  07/2010   H PYLORI GASTRITIS  . ESOPHAGOGASTRODUODENOSCOPY (EGD) WITH PROPOFOL N/A 04/13/2019   Procedure: ESOPHAGOGASTRODUODENOSCOPY (EGD) WITH PROPOFOL;  Surgeon: Danie Binder, MD;  [facility];  Service: Endoscopy;  Laterality: N/A;  . IMPACTION REMOVAL  04/14/2019   Procedure: IMPACTION REMOVAL;  Surgeon: Danie Binder, MD;  [facility];  Service:  Endoscopy;;  COLON  . POLYPECTOMY  04/16/2019   Procedure: POLYPECTOMY;  Surgeon: Danie Binder, MD;  [facility];  Service: Endoscopy;;    Social History   Socioeconomic History  . Marital status: Married    Spouse name: Not on file  . Number of children: Not on file  . Years of education: Not on file  . Highest education level: Not on file  Occupational History  . Not on file  Social Needs  . Financial resource strain: Not on file  . Food insecurity    Worry: Not on file    Inability: Not on file  . Transportation needs    Medical: Not on file    Non-medical: Not on file  Tobacco Use  . Smoking status: Current Every Day Smoker    Packs/day: 1.00    Types: Cigarettes  . Smokeless tobacco: Never Used  Substance and Sexual Activity  . Alcohol use: No  . Drug use: No  . Sexual activity: Never  Lifestyle  . Physical activity    Days per week: Not on file    Minutes per session: Not on file  . Stress: Not on file  Relationships  . Social Herbalist on phone: Not on file    Gets together: Not on file    Attends religious service: Not on file    Active member of club or organization: Not on file  Attends meetings of clubs or organizations: Not on file    Relationship status: Not on file  . Intimate partner violence    Fear of current or ex partner: Not on file    Emotionally abused: Not on file    Physically abused: Not on file    Forced sexual activity: Not on file  Other Topics Concern  . 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 SPENDS FREE TIME: [employer]. BIBLE STUDY. WIFE DOES SHOPPING & DRIVING. [facility].   Family History  Problem Relation Age of Onset  . Colon polyps Neg Hx   . Colon cancer Neg Hx       VITAL SIGNS BP 120/76   Pulse 83   Temp (!) 97.1 F (36.2 C) (Oral)   Resp 20   Ht 5\' 9"  (1.753 m)   Wt 146 lb 1.6 oz (66.3 kg)   BMI  21.58 kg/m   Outpatient Encounter Medications as of 04/21/2019  Medication Sig  . amoxicillin (AMOXIL) 500 MG capsule Take 1 capsule (500 mg total) by mouth 2 (two) times daily with a meal for 10 days.  Marland Kitchen aspirin EC 81 MG tablet Take 81 mg by mouth daily.  Marland Kitchen atorvastatin (LIPITOR) 80 MG tablet Take 40 mg by mouth at bedtime.  . clarithromycin (BIAXIN) 500 MG tablet Take 1 tablet (500 mg total) by mouth 2 (two) times daily with a meal for 10 days.  . cyanocobalamin 1000 MCG tablet Take 1,000 mcg by mouth daily.  Marland Kitchen docusate sodium (COLACE) 100 MG capsule Take 100 mg by mouth daily.  . ferrous sulfate 325 (65 FE) MG tablet Take 1 tablet (325 mg total) by mouth daily.  . hydrochlorothiazide (HYDRODIURIL) 25 MG tablet Take 25 mg by mouth daily.  . NON FORMULARY Diet - Liquids: __X_Regular;  Diet: Regular, NAS, Consistent Carbohydrate  . pantoprazole (PROTONIX) 40 MG tablet Take 1 tablet (40 mg total) by mouth 2 (two) times daily before a meal for 10 days.   No facility-administered encounter medications on file as of 04/21/2019.      SIGNIFICANT DIAGNOSTIC EXAMS  PREVIOUS   04-11-19: KUB no acute findings.   04-13-19: EGD: Benign-appearing esophageal STRICTURE   Medium-sized hiatal hernia.  MILD Gastritis. Biopsied.  04-14-19: colonoscopy: - ANEMIA DUE TO RECTAL ULCER AND COLORECTAL POLYPS WHILE TAKING ASPIRIN - MODERATE Diverticulosis in the recto-sigmoid colon, in the sigmoid colon and in thevdescending colon. - Tortuous LEFT AND RIGHT colon.  NO NEW EXAMS.   LABS REVIEWED PREVIOUS:   04-11-19: wbc 6.3; hgb 5.6; hct 23.2; mcv 75.6 ;plt 304; glucose 144; bun 20; creat 1.42; k+ 3.6; na++ 136; ca 9.6 liver normal albumin 4.1 tsh 4.166; vit B 12: 204; iron 8 tibc 393; ferritin 4  04-13-19: wbc 4.2; hgb 8.4; hct 31.6; mcv 78.8; plt 227; glucose 86; bun 11; creat 0.98; k+ 3.8; na++ 140; ca 9.0; mag 2.0 04-15-19: wbc 4.3; hgb 8.4; hct 31.6 mcv 78.8; plt 227 glucose 85; bun 10; creat 0.91; k+ 3.9;  na++ 140; ca 8.6 04-17-19: wbc 3.4; hgb 8.1; hct 30.7; mcv 78.9 plt 209; glucose 85; bun 7; creat 1.00; k+ 3.9; na++ 139; ca 8.5   NO NEW LABS.   Review of Systems  Constitutional: Negative for malaise/fatigue.  Respiratory: Negative for cough.   Cardiovascular: Negative for chest pain and leg swelling.  Gastrointestinal: Negative for abdominal pain, constipation and heartburn.  Musculoskeletal: Negative for back pain and myalgias.  Skin: Negative.   Neurological: Negative for dizziness.  Psychiatric/Behavioral: The patient is not nervous/anxious.     Physical Exam Constitutional:      General: He is not in acute distress.    Appearance: He is well-developed. He is not diaphoretic.  Neck:     Musculoskeletal: Neck supple.     Thyroid: No thyromegaly.  Cardiovascular:     Rate and Rhythm: Normal rate and regular rhythm.     Pulses: Normal pulses.     Heart sounds: Normal heart sounds.  Pulmonary:     Effort: Pulmonary effort is normal. No respiratory distress.     Breath sounds: Normal breath sounds.  Abdominal:     General: Bowel sounds are normal. There is no distension.     Palpations: Abdomen is soft.     Tenderness: There is no abdominal tenderness.  Musculoskeletal:     Right lower leg: No edema.     Left lower leg: No edema.     Comments: Is able to move all extremities  Right side hemiparesis    Lymphadenopathy:     Cervical: No cervical adenopathy.  Skin:    General: Skin is warm and dry.  Neurological:     Mental Status: He is alert. Mental status is at baseline.     Comments:  Has mild expressive aphasia   Psychiatric:        Mood and Affect: Mood normal.       ASSESSMENT/ PLAN:  TODAY;   1. Iron deficiency due to chronic blood loss  2. Hemiparesis of right dominant side due to cerebrovascular disease 3. stercoral ulcer of rectum  Will continue therapy as directed Will continue current medications His goal is to return back home; no dme at this  time.    MD is aware of resident's narcotic use and is in agreement with current plan of care. We will attempt to wean resident as appropriate.  Ok Edwards NP [facility]  Contact [phone] Monday through Friday 8am- 5pm  After hours call [phone]

## 2019-04-23 ENCOUNTER — Encounter (HOSPITAL_COMMUNITY)
Admission: RE | Admit: 2019-04-23 | Discharge: 2019-04-23 | Disposition: A | Discharge status: Home / Self Care | Payer: Medicare Other | Source: Skilled Nursing Facility | Attending: Adult Health | Admitting: Adult Health

## 2019-04-23 ENCOUNTER — Telehealth: Payer: Self-pay | Admitting: Gastroenterology

## 2019-04-23 ENCOUNTER — Telehealth: Payer: Self-pay

## 2019-04-23 DIAGNOSIS — B9681 Helicobacter pylori [H. pylori] as the cause of diseases classified elsewhere: Secondary | ICD-10-CM | POA: Insufficient documentation

## 2019-04-23 DIAGNOSIS — I110 Hypertensive heart disease with heart failure: Secondary | ICD-10-CM | POA: Insufficient documentation

## 2019-04-23 DIAGNOSIS — I5032 Chronic diastolic (congestive) heart failure: Secondary | ICD-10-CM | POA: Insufficient documentation

## 2019-04-23 DIAGNOSIS — D6489 Other specified anemias: Secondary | ICD-10-CM | POA: Insufficient documentation

## 2019-04-23 LAB — CBC
HCT: 27.7 % — ABNORMAL LOW (ref 39.0–52.0)
Hemoglobin: 7.6 g/dL — ABNORMAL LOW (ref 13.0–17.0)
MCH: 21.3 pg — ABNORMAL LOW (ref 26.0–34.0)
MCHC: 27.4 g/dL — ABNORMAL LOW (ref 30.0–36.0)
MCV: 77.6 fL — ABNORMAL LOW (ref 80.0–100.0)
Platelets: 220 10*3/uL (ref 150–400)
RBC: 3.57 MIL/uL — ABNORMAL LOW (ref 4.22–5.81)
RDW: 22 % — ABNORMAL HIGH (ref 11.5–15.5)
WBC: 3.5 10*3/uL — ABNORMAL LOW (ref 4.0–10.5)
nRBC: 0 % (ref 0.0–0.2)

## 2019-04-23 NOTE — Telephone Encounter (Signed)
PT's wife left Vm asking for results of path.  She said he is at the [facility] now. But she is wanting to know if Dr. Oneida Alar has found out why he was losing blood. Ok to leave vm if she is not in.

## 2019-04-23 NOTE — Telephone Encounter (Signed)
SEE TC [id number]

## 2019-04-26 ENCOUNTER — Encounter: Payer: Self-pay | Admitting: Adult Health

## 2019-04-26 ENCOUNTER — Non-Acute Institutional Stay (SKILLED_NURSING_FACILITY): Payer: Medicare Other | Admitting: Adult Health

## 2019-04-26 DIAGNOSIS — E785 Hyperlipidemia, unspecified: Secondary | ICD-10-CM

## 2019-04-26 DIAGNOSIS — K5909 Other constipation: Secondary | ICD-10-CM | POA: Diagnosis not present

## 2019-04-26 DIAGNOSIS — K626 Ulcer of anus and rectum: Secondary | ICD-10-CM

## 2019-04-26 DIAGNOSIS — K2970 Gastritis, unspecified, without bleeding: Secondary | ICD-10-CM | POA: Diagnosis not present

## 2019-04-26 DIAGNOSIS — B9681 Helicobacter pylori [H. pylori] as the cause of diseases classified elsewhere: Secondary | ICD-10-CM

## 2019-04-26 NOTE — Telephone Encounter (Signed)
Please call pt. HE HAD TWO SERRATED POLYP AND SEVEN SIMPLE ADENOMAS removed. THE RECTAL ULCERS ARE FROM FECAL IMPACTION.  DRINK WATER TO KEEP YOUR URINE LIGHT YELLOW.  FOLLOW A HIGH FIBER DIET. AVOID ITEMS THAT CAUSE BLOATING & GAS.   ADD SENOKOT BID TO KEEP BOWELS MOVING.  COMPLETE LABS DEC 2020.  FOLLOW UP MAR 2021.  YOUR SISTERS, BROTHERS, CHILDREN, AND PARENTS NEED TO HAVE A COLONOSCOPY STARTING AT THE AGE OF 45.

## 2019-04-26 NOTE — Progress Notes (Signed)
Location:    [place] Room Number: 160/P Place of Service:  SNF (31)   CODE STATUS: Full Code  No Known Allergies  Chief Complaint  Patient presents with  . Medical Management of Chronic Issues         Dyslipidemia; Chronic constipation will continue colace daily  Stercoral ulcer of rectum/gastritis Helicobacter pylori/heme positive stools:     HPI:  He  is a 73 year old short term rehab patient being seen for the management of his chronic illnesses: dyslipidemia; constipation; gastric ulceration. He denies any abdominal pain; no heart burn; no constipation or diarrhea. His appetite is good.   Past Medical History:  Diagnosis Date  . Abnormal EKG 01/29/2015   RBBB and left fascicular block  . Anemia 06/04/2010   Qualifier: Diagnosis of  By: Oneida Alar MD, Sandi L   . Arthritis   . Cerebral artery occlusion with cerebral infarction ([place]) 07/15/2011  . Constipation   . Diastolic dysfunction 123456   Grade 1. EF 65-70%  . Hepatitis C antibody test positive 02/01/2015  . HTN (hypertension)   . Leucopenia 01/30/2015  . Stiffness of joints, not elsewhere classified, multiple sites 11/04/2013  . Stroke ([place])   . Thrombocytopenia ([place]) 01/30/2015    Past Surgical History:  Procedure Laterality Date  . BIOPSY  04/16/2019   Procedure: BIOPSY;  Surgeon: Danie Binder, MD;  [facility];  Service: Endoscopy;;  . COLONOSCOPY  07/2010   FOR ANEMIA-DIVERTICULOSIS  . COLONOSCOPY N/A 04/16/2019   Procedure: COLONOSCOPY;  Surgeon: Danie Binder, MD;  [facility];  Service: Endoscopy;  Laterality: N/A;  . ESOPHAGOGASTRODUODENOSCOPY  07/2010   H PYLORI GASTRITIS  . ESOPHAGOGASTRODUODENOSCOPY (EGD) WITH PROPOFOL N/A 04/13/2019   Procedure: ESOPHAGOGASTRODUODENOSCOPY (EGD) WITH PROPOFOL;  Surgeon: Danie Binder, MD;  [facility];  Service: Endoscopy;  Laterality: N/A;  . IMPACTION REMOVAL  04/14/2019   Procedure: IMPACTION REMOVAL;   Surgeon: Danie Binder, MD;  [facility];  Service: Endoscopy;;  COLON  . POLYPECTOMY  04/16/2019   Procedure: POLYPECTOMY;  Surgeon: Danie Binder, MD;  [facility];  Service: Endoscopy;;    Social History   Socioeconomic History  . Marital status: Married    Spouse name: Not on file  . Number of children: Not on file  . Years of education: Not on file  . Highest education level: Not on file  Occupational History  . Not on file  Social Needs  . Financial resource strain: Not on file  . Food insecurity    Worry: Not on file    Inability: Not on file  . Transportation needs    Medical: Not on file    Non-medical: Not on file  Tobacco Use  . Smoking status: Current Every Day Smoker    Packs/day: 1.00    Types: Cigarettes  . Smokeless tobacco: Never Used  Substance and Sexual Activity  . Alcohol use: No  . Drug use: No  . Sexual activity: Never  Lifestyle  . Physical activity    Days per week: Not on file    Minutes per session: Not on file  . Stress: Not on file  Relationships  . Social Herbalist on phone: Not on file    Gets together: Not on file    Attends religious service: Not on file    Active member of club or organization: Not on file  Attends meetings of clubs or organizations: Not on file    Relationship status: Not on file  . Intimate partner violence    Fear of current or ex partner: Not on file    Emotionally abused: Not on file    Physically abused: Not on file    Forced sexual activity: Not on file  Other Topics Concern  . 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 SPENDS FREE TIME: [employer]. BIBLE STUDY. WIFE DOES SHOPPING & DRIVING. [facility].   Family History  Problem Relation Age of Onset  . Colon polyps Neg Hx   . Colon cancer Neg Hx       VITAL SIGNS BP 110/63   Pulse 80   Temp (!) 97 F (36.1 C) (Oral)   Resp  17   Ht 5\' 9"  (1.753 m)   Wt 146 lb 1.6 oz (66.3 kg)   BMI 21.58 kg/m   Outpatient Encounter Medications as of 04/26/2019  Medication Sig  . amoxicillin (AMOXIL) 500 MG capsule Take 1 capsule (500 mg total) by mouth 2 (two) times daily with a meal for 10 days.  Marland Kitchen aspirin EC 81 MG tablet Take 81 mg by mouth daily.  Marland Kitchen atorvastatin (LIPITOR) 80 MG tablet Take 40 mg by mouth at bedtime.  . clarithromycin (BIAXIN) 500 MG tablet Take 1 tablet (500 mg total) by mouth 2 (two) times daily with a meal for 10 days.  . cyanocobalamin 1000 MCG tablet Take 1,000 mcg by mouth daily.  Marland Kitchen docusate sodium (COLACE) 100 MG capsule Take 100 mg by mouth daily.  . feeding supplement, ENSURE ENLIVE, (ENSURE ENLIVE) LIQD Take 237 mLs by mouth daily. Patient likes Chocolate  . ferrous sulfate 325 (65 FE) MG tablet Take 1 tablet (325 mg total) by mouth daily.  . hydrochlorothiazide (HYDRODIURIL) 25 MG tablet Take 25 mg by mouth daily.  . NON FORMULARY Diet - Liquids: __X_Regular;  Diet: Regular, NAS, Consistent Carbohydrate  . pantoprazole (PROTONIX) 40 MG tablet Take 1 tablet (40 mg total) by mouth 2 (two) times daily before a meal for 10 days.   No facility-administered encounter medications on file as of 04/26/2019.      SIGNIFICANT DIAGNOSTIC EXAMS   PREVIOUS   04-11-19: KUB no acute findings.   04-13-19: EGD: Benign-appearing esophageal STRICTURE   Medium-sized hiatal hernia.  MILD Gastritis. Biopsied.  04-14-19: colonoscopy: - ANEMIA DUE TO RECTAL ULCER AND COLORECTAL POLYPS WHILE TAKING ASPIRIN - MODERATE Diverticulosis in the recto-sigmoid colon, in the sigmoid colon and in thevdescending colon. - Tortuous LEFT AND RIGHT colon.  NO NEW EXAMS.   LABS REVIEWED PREVIOUS:   04-11-19: wbc 6.3; hgb 5.6; hct 23.2; mcv 75.6 ;plt 304; glucose 144; bun 20; creat 1.42; k+ 3.6; na++ 136; ca 9.6 liver normal albumin 4.1 tsh 4.166; vit B 12: 204; iron 8 tibc 393; ferritin 4  04-13-19: wbc 4.2; hgb 8.4; hct 31.6; mcv  78.8; plt 227; glucose 86; bun 11; creat 0.98; k+ 3.8; na++ 140; ca 9.0; mag 2.0 04-15-19: wbc 4.3; hgb 8.4; hct 31.6 mcv 78.8; plt 227 glucose 85; bun 10; creat 0.91; k+ 3.9; na++ 140; ca 8.6 04-17-19: wbc 3.4; hgb 8.1; hct 30.7; mcv 78.9 plt 209; glucose 85; bun 7; creat 1.00; k+ 3.9; na++ 139; ca 8.5   TODAY  04-23-19: wbc 3.5; hgb 7.6; hct 27.7; mcv 77.6; plt 220    Review of  Systems  Constitutional: Negative for malaise/fatigue.  Respiratory: Negative for cough.   Cardiovascular: Negative for chest pain.  Gastrointestinal: Negative for abdominal pain.  Musculoskeletal: Negative for back pain and joint pain.  Skin: Negative.   Neurological: Negative for dizziness.  Psychiatric/Behavioral: The patient is not nervous/anxious.     Physical Exam Constitutional:      General: He is not in acute distress.    Appearance: He is well-developed. He is not diaphoretic.  Neck:     Musculoskeletal: Neck supple.     Thyroid: No thyromegaly.  Cardiovascular:     Rate and Rhythm: Normal rate and regular rhythm.     Pulses: Normal pulses.     Heart sounds: Normal heart sounds.  Pulmonary:     Effort: Pulmonary effort is normal. No respiratory distress.     Breath sounds: Normal breath sounds.  Abdominal:     General: Bowel sounds are normal. There is no distension.     Palpations: Abdomen is soft.     Tenderness: There is no abdominal tenderness.  Musculoskeletal:     Right lower leg: No edema.     Left lower leg: No edema.     Comments: Is able to move all extremities  Right side hemiparesis     Lymphadenopathy:     Cervical: No cervical adenopathy.  Skin:    General: Skin is warm and dry.  Neurological:     Mental Status: He is alert. Mental status is at baseline.     Comments: Has mild expressive aphasia   Psychiatric:        Mood and Affect: Mood normal.      ASSESSMENT/ PLAN:  TODAY:  1. Dyslipidemia; is stable will continue lipitor 40 mg daily  2. Chronic  constipation will continue colace daily   3. Stercoral ulcer of rectum/gastritis Helicobacter pylori/heme positive stools: is stable will continue amoxil 500 mg twice daily; biaxin 500 mg twice daily and protonix 40 mg twice daily for 10 days. Will monitor   PREVIOUS  4. Vitamin B 12 deficiency: is without change b12: 209 will continue vit b12 1000 mcg daily   5.  Essential hypertension: is stable b/p 110/63 will continue hctz 25 mg daily   6. Cerebral artery occlusion with cerebral infarction/hemiparesis of right dominant side due to cerebrovascular disease:is neurologically stable will continue asa 81 mg daily   7. Iron deficiency anemia due to chronic blood loss: is stable hgb 7.6 will continue iron daily; is status post blood transfusion in the hospital  Will monitor hgb   WILL CHECK HGB    MD is aware of resident's narcotic use and is in agreement with current plan of care. We will attempt to wean resident as appropriate.  Ok Edwards NP [facility]  Contact [phone] Monday through Friday 8am- 5pm  After hours call [phone]

## 2019-04-27 ENCOUNTER — Encounter: Payer: Self-pay | Admitting: Adult Health

## 2019-04-27 NOTE — Progress Notes (Signed)
: Provider:  Hennie Duos., MD Location:  [place] Room Number: 160-P Place of Service:  SNF ([phone])  PCP: Beacher May, MD Patient Care Team: Beacher May, MD as PCP - General (Internal Medicine)  Extended Emergency Contact Information Primary Emergency Contact: Onofre,Carolyn Address: [address]          [place], [place] [address] [place] Phone: [phone] Relation: Spouse Secondary Emergency Contact: Aymaan, [place], [place] [address] [place] Phone: [phone] Relation: Brother     Allergies: Patient has no known allergies.  Chief Complaint  Patient presents with  . New Admit To SNF    New admission to [facility]    HPI: Patient is a 73 y.o. male with old CVA with right residual hemiparesis who was brought from home because his wife is concerned that he had been up all night wandering around the house.  He was also progressively having difficult time moving his bowels.  He takes aspirin daily for secondary stroke prevention.  In the ED he was found to have a hemoglobin of 5.6.  Patient was admitted to [facility] from 9/6-12 where he underwent an EGD on 9/8 showing gastritis and no other stigmata of bleeding.  Colonoscopy finally on 912 after several attempts showing a high stool burden with findings of a stair coral ulcer.  He also had prior EGD with findings H. pylori gastritis had been started on treatment with amoxicillin Biaxin and Protonix for 10 days he is noted to have iron deficiency anemia.  Patient is admitted to skilled nursing facility for OT/PT.  While at skilled nursing facility patient will be followed for hyperlipidemia treated with Lipitor hypertension treated with hydrochlorothiazide and history of CVA treated with aspirin..                           Past Medical History:  Diagnosis Date  . Abnormal EKG 01/29/2015   RBBB and left fascicular block  . Anemia  06/04/2010   Qualifier: Diagnosis of  By: Oneida Alar MD, Sandi L   . Arthritis   . Cerebral artery occlusion with cerebral infarction ([place]) 07/15/2011  . Constipation   . Diastolic dysfunction 123456   Grade 1. EF 65-70%  . Hepatitis C antibody test positive 02/01/2015  . HTN (hypertension)   . Leucopenia 01/30/2015  . Stiffness of joints, not elsewhere classified, multiple sites 11/04/2013  . Stroke ([place])   . Thrombocytopenia ([place]) 01/30/2015    Past Surgical History:  Procedure Laterality Date  . BIOPSY  04/16/2019   Procedure: BIOPSY;  Surgeon: Danie Binder, MD;  [facility];  Service: Endoscopy;;  . COLONOSCOPY  07/2010   FOR ANEMIA-DIVERTICULOSIS  . COLONOSCOPY N/A 04/16/2019   Procedure: COLONOSCOPY;  Surgeon: Danie Binder, MD;  [facility];  Service: Endoscopy;  Laterality: N/A;  . ESOPHAGOGASTRODUODENOSCOPY  07/2010   H PYLORI GASTRITIS  . ESOPHAGOGASTRODUODENOSCOPY (EGD) WITH PROPOFOL N/A 04/13/2019   Procedure: ESOPHAGOGASTRODUODENOSCOPY (EGD) WITH PROPOFOL;  Surgeon: Danie Binder, MD;  [facility];  Service: Endoscopy;  Laterality: N/A;  . IMPACTION REMOVAL  04/14/2019   Procedure: IMPACTION REMOVAL;  Surgeon: Danie Binder, MD;  [facility];  Service: Endoscopy;;  COLON  . POLYPECTOMY  04/16/2019   Procedure: POLYPECTOMY;  Surgeon: Danie Binder, MD;  [facility];  Service: Endoscopy;;    Allergies as of 04/28/2019   No Known Allergies     Medication List    Notice   This visit is during an admission. Changes to the med list made in this visit will be reflected in the After Visit Summary of the admission.     No orders of the defined types were placed in this encounter.   Immunization History  Administered Date(s) Administered  . Fluad Quad(high Dose 65+) 04/16/2019  . Pneumococcal Polysaccharide-23 04/12/2019  . Tdap 05/28/2012    Social History   Tobacco Use  . Smoking status: Current  Every Day Smoker    Packs/day: 1.00    Types: Cigarettes  . Smokeless tobacco: Never Used  Substance Use Topics  . Alcohol use: No    Family history is mom with diabetes  Family History  Problem Relation Age of Onset  . Colon polyps Neg Hx   . Colon cancer Neg Hx       Review of Systems  DATA OBTAINED: from patient-limited; nursing no acute concerns GENERAL:  no fevers, fatigue, appetite changes SKIN: No itching, or rash EYES: No eye pain, redness, discharge EARS: No earache, tinnitus, change in hearing NOSE: No congestion, drainage or bleeding  MOUTH/THROAT: No mouth or tooth pain, No sore throat RESPIRATORY: No cough, wheezing, SOB CARDIAC: No chest pain, palpitations, lower extremity edema  GI: No abdominal pain, No N/V/D or constipation, No heartburn or reflux  GU: No dysuria, frequency or urgency, or incontinence  MUSCULOSKELETAL: No unrelieved bone/joint pain NEUROLOGIC: No headache, dizziness or focal weakness PSYCHIATRIC: No c/o anxiety or sadness   Vitals:   04/28/19 1116  BP: 123/75  Pulse: 62  Resp: 20  Temp: 98.2 F (36.8 C)    SpO2 Readings from Last 1 Encounters:  04/17/19 97%   Body mass index is 21.15 kg/m.     Physical Exam  GENERAL APPEARANCE: Alert, moderately conversant,  No acute distress.  SKIN: No diaphoresis rash HEAD: Normocephalic, atraumatic  EYES: Conjunctiva/lids clear. Pupils round, reactive. EOMs intact.  EARS: External exam WNL, canals clear. Hearing grossly normal.  NOSE: No deformity or discharge.  MOUTH/THROAT: Lips w/o lesions  RESPIRATORY: Breathing is even, unlabored. Lung sounds are clear   CARDIOVASCULAR: Heart RRR no murmurs, rubs or gallops. No peripheral edema.   GASTROINTESTINAL: Abdomen is soft, non-tender, not distended w/ normal bowel sounds. GENITOURINARY: Bladder non tender, not distended  MUSCULOSKELETAL: No abnormal joints or musculature NEUROLOGIC:  Cranial nerves 2-12 grossly intact. Moves all  extremities with some right-sided weakness PSYCHIATRIC: Mood and affect appropriate to situation with some confusion, no behavioral issues  Patient Active Problem List   Diagnosis Date Noted  . Hemiparesis of right dominant side due to cerebrovascular disease ([place]) 04/19/2019  . Dyslipidemia 04/19/2019  . Chronic constipation 04/19/2019  . Vitamin B12 deficiency 04/19/2019  . Stercoral ulcer of rectum 04/19/2019  . Gastritis, Helicobacter pylori 99991111  . Iron deficiency anemia   . Gastrointestinal hemorrhage   . Heme positive stool 04/11/2019  . Orthostatic hypotension 04/11/2019  . Current smoker 04/11/2019  . Hepatitis C antibody test positive 02/01/2015  . Diastolic dysfunction 0000000  . Leucopenia 01/30/2015  . Thrombocytopenia ([place]) 01/30/2015  . Generalized weakness 01/29/2015  . Diaphoresis 01/29/2015  . Essential hypertension 01/29/2015  . Abnormal EKG 01/29/2015  . Acute kidney injury ([place]) 01/29/2015  . Hyperglycemia 01/29/2015  . UTI (urinary tract infection), bacterial 01/29/2015  . Difficulty walking 11/04/2013  . Stiffness of joints, not  elsewhere classified, multiple sites 11/04/2013  . Cerebral artery occlusion with cerebral infarction ([place]) 07/15/2011  . Lack of coordination 07/15/2011  . Symptomatic anemia 06/04/2010      Labs reviewed: Basic Metabolic Panel:    Component Value Date/Time   NA 139 04/17/2019 0649   K 3.9 04/17/2019 0649   CL 108 04/17/2019 0649   CO2 24 04/17/2019 0649   GLUCOSE 85 04/17/2019 0649   BUN 7 (L) 04/17/2019 0649   CREATININE 1.00 04/17/2019 0649   CALCIUM 8.5 (L) 04/17/2019 0649   PROT 8.0 04/11/2019 0751   ALBUMIN 4.1 04/11/2019 0751   AST 24 04/11/2019 0751   ALT 15 04/11/2019 0751   ALKPHOS 62 04/11/2019 0751   BILITOT 0.4 04/11/2019 0751   GFRNONAA >60 04/17/2019 0649   GFRAA >60 04/17/2019 0649    Recent Labs    04/12/19 0423 04/13/19 0451 04/14/19 0447 04/15/19 0449 04/16/19 0658 04/17/19  0649  NA 137 140 136 140 139 139  K 3.5 3.8 3.3* 3.9 3.9 3.9  CL 104 109 107 112* 109 108  CO2 26 24 21* 22 25 24   GLUCOSE 91 86 80 85 87 85  BUN 13 11 11 10  6* 7*  CREATININE 1.12 0.98 0.86 0.91 0.87 1.00  CALCIUM 9.2 9.0 8.6* 8.6* 8.5* 8.5*  MG 1.9 2.0 1.9  --   --   --    Liver Function Tests: Recent Labs    04/11/19 0751  AST 24  ALT 15  ALKPHOS 62  BILITOT 0.4  PROT 8.0  ALBUMIN 4.1   No results for input(s): LIPASE, AMYLASE in the last 8760 hours. No results for input(s): AMMONIA in the last 8760 hours. CBC: Recent Labs    04/11/19 0751  04/16/19 0658 04/17/19 0649 04/23/19 0700  WBC 6.3   < > 2.9* 3.4* 3.5*  NEUTROABS 5.2  --   --   --   --   HGB 5.6*   < > 7.9* 8.1* 7.6*  HCT 23.2*   < > 30.0* 30.7* 27.7*  MCV 75.6*   < > 79.2* 78.9* 77.6*  PLT 304   < > 215 209 220   < > = values in this interval not displayed.   Lipid No results for input(s): CHOL, HDL, LDLCALC, TRIG in the last 8760 hours.  Cardiac Enzymes: No results for input(s): CKTOTAL, CKMB, CKMBINDEX, TROPONINI in the last 8760 hours. BNP: No results for input(s): BNP in the last 8760 hours. No results found for: [facility] Lab Results  Component Value Date   HGBA1C 5.8 (H) 01/29/2015   Lab Results  Component Value Date   TSH 4.166 04/11/2019   Lab Results  Component Value Date   VITAMINB12 204 04/11/2019   Lab Results  Component Value Date   FOLATE 11.6 04/12/2019   Lab Results  Component Value Date   IRON 8 (L) 04/11/2019   TIBC 393 04/11/2019   FERRITIN 4 (L) 04/11/2019    Imaging and Procedures obtained prior to SNF admission: No results found.   Not all labs, radiology exams or other studies done during hospitalization come through on my EPIC note; however they are reviewed by me.    Assessment and Plan  Chronic blood loss anemia- patient presented with a hemoglobin of 5.6 and confusion.;  EGD on 9/8 noted mild gastritis and no other stigmata of active bleeding;  patient was started on amoxicillin Biaxin and Protonix for 10 days; 9/11 colonoscopy with findings of ST ER CO  RAL ulcer; patient with an iron level of 8 and was started on iron replacement; not stated but according to hemoglobins patient was to receive some PRBC because his hemoglobin went up to 8.4 and settled in at 8.1 near the time of discharge SNF- admitted for OT/PT; continue iron 325 mg daily; for H pylori continue Biaxin 500 mg twice daily for total of 10 days, Protonix 40 mg twice daily for total of 10 days, and amoxicillin 500 mg twice daily for total of 10 days  Hyperlipidemia SNF-not stated as uncontrolled; continue Lipitor 40 mg nightly  Hypertension SNF-stable; continue hydrochlorothiazide 25 mg daily  History of CVA SNF- continue ASA 81 mg daily as stroke prophylaxis   Times program for 5 minutes;> 50% of time with patient was spent reviewing records, labs, tests and studies, counseling and developing plan of care  Hennie Duos, MD

## 2019-04-27 NOTE — Telephone Encounter (Signed)
LMOM to call.

## 2019-04-27 NOTE — Progress Notes (Signed)
Location:    [place] Room Number: 160/P Place of Service:  SNF (31)   CODE STATUS: Full Code  No Known Allergies  Chief Complaint  Patient presents with  . Acute Visit    Weight Loss     HPI:    Past Medical History:  Diagnosis Date  . Abnormal EKG 01/29/2015   RBBB and left fascicular block  . Anemia 06/04/2010   Qualifier: Diagnosis of  By: Oneida Alar MD, Sandi L   . Arthritis   . Cerebral artery occlusion with cerebral infarction ([place]) 07/15/2011  . Constipation   . Diastolic dysfunction 123456   Grade 1. EF 65-70%  . Hepatitis C antibody test positive 02/01/2015  . HTN (hypertension)   . Leucopenia 01/30/2015  . Stiffness of joints, not elsewhere classified, multiple sites 11/04/2013  . Stroke ([place])   . Thrombocytopenia ([place]) 01/30/2015    Past Surgical History:  Procedure Laterality Date  . BIOPSY  04/16/2019   Procedure: BIOPSY;  Surgeon: Danie Binder, MD;  Location: [facility];  Service: Endoscopy;;  . COLONOSCOPY  07/2010   FOR ANEMIA-DIVERTICULOSIS  . COLONOSCOPY N/A 04/16/2019   Procedure: COLONOSCOPY;  Surgeon: Danie Binder, MD;  Location: [facility];  Service: Endoscopy;  Laterality: N/A;  . ESOPHAGOGASTRODUODENOSCOPY  07/2010   H PYLORI GASTRITIS  . ESOPHAGOGASTRODUODENOSCOPY (EGD) WITH PROPOFOL N/A 04/13/2019   Procedure: ESOPHAGOGASTRODUODENOSCOPY (EGD) WITH PROPOFOL;  Surgeon: Danie Binder, MD;  [facility];  Service: Endoscopy;  Laterality: N/A;  . IMPACTION REMOVAL  04/14/2019   Procedure: IMPACTION REMOVAL;  Surgeon: Danie Binder, MD;  [facility];  Service: Endoscopy;;  COLON  . POLYPECTOMY  04/16/2019   Procedure: POLYPECTOMY;  Surgeon: Danie Binder, MD;  [facility];  Service: Endoscopy;;    Social History   Socioeconomic History  . Marital status: Married    Spouse name: Not on file  . Number of children: Not on file  . Years of education: Not on file  . Highest  education level: Not on file  Occupational History  . Not on file  Social Needs  . Financial resource strain: Not on file  . Food insecurity    Worry: Not on file    Inability: Not on file  . Transportation needs    Medical: Not on file    Non-medical: Not on file  Tobacco Use  . Smoking status: Current Every Day Smoker    Packs/day: 1.00    Types: Cigarettes  . Smokeless tobacco: Never Used  Substance and Sexual Activity  . Alcohol use: No  . Drug use: No  . Sexual activity: Never  Lifestyle  . Physical activity    Days per week: Not on file    Minutes per session: Not on file  . Stress: Not on file  Relationships  . Social Herbalist on phone: Not on file    Gets together: Not on file    Attends religious service: Not on file    Active member of club or organization: Not on file    Attends meetings of clubs or organizations: Not on file    Relationship status: Not on file  . Intimate partner violence    Fear of current or ex partner: Not on file    Emotionally abused: Not on file    Physically abused: Not on file    Forced sexual activity: Not on file  Other  Topics Concern  . 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 SPENDS FREE TIME: [employer]. BIBLE STUDY. WIFE DOES SHOPPING & DRIVING. [facility].   Family History  Problem Relation Age of Onset  . Colon polyps Neg Hx   . Colon cancer Neg Hx       VITAL SIGNS BP 123/75   Pulse 62   Temp 98.2 F (36.8 C) (Oral)   Resp 20   Ht 5\' 9"  (1.753 m)   Wt 143 lb 3.2 oz (65 kg)   BMI 21.15 kg/m   Outpatient Encounter Medications as of 04/27/2019  Medication Sig  . aspirin EC 81 MG tablet Take 81 mg by mouth daily.  Marland Kitchen atorvastatin (LIPITOR) 80 MG tablet Take 40 mg by mouth at bedtime.  . cyanocobalamin 1000 MCG tablet Take 1,000 mcg by mouth daily.  Marland Kitchen docusate sodium (COLACE) 100 MG capsule Take 100 mg by mouth daily.   . feeding supplement, ENSURE ENLIVE, (ENSURE ENLIVE) LIQD Take 237 mLs by mouth daily. Patient likes Chocolate  . ferrous sulfate 325 (65 FE) MG tablet Take 1 tablet (325 mg total) by mouth daily.  . hydrochlorothiazide (HYDRODIURIL) 25 MG tablet Take 25 mg by mouth daily.  . NON FORMULARY Diet - Liquids: __X_Regular;  Diet: Regular, NAS, Consistent Carbohydrate  . [DISCONTINUED] amoxicillin (AMOXIL) 500 MG capsule Take 1 capsule (500 mg total) by mouth 2 (two) times daily with a meal for 10 days.  . [DISCONTINUED] clarithromycin (BIAXIN) 500 MG tablet Take 1 tablet (500 mg total) by mouth 2 (two) times daily with a meal for 10 days.  . [DISCONTINUED] pantoprazole (PROTONIX) 40 MG tablet Take 1 tablet (40 mg total) by mouth 2 (two) times daily before a meal for 10 days.   No facility-administered encounter medications on file as of 04/27/2019.      SIGNIFICANT DIAGNOSTIC EXAMS       ASSESSMENT/ PLAN:    MD is aware of resident's narcotic use and is in agreement with current plan of care. We will attempt to wean resident as appropriate.  Ok Edwards NP [facility]  Contact [phone] Monday through Friday 8am- 5pm  After hours call [phone]

## 2019-04-28 ENCOUNTER — Non-Acute Institutional Stay (SKILLED_NURSING_FACILITY): Payer: Medicare Other | Admitting: Internal Medicine

## 2019-04-28 DIAGNOSIS — B9681 Helicobacter pylori [H. pylori] as the cause of diseases classified elsewhere: Secondary | ICD-10-CM

## 2019-04-28 DIAGNOSIS — D500 Iron deficiency anemia secondary to blood loss (chronic): Secondary | ICD-10-CM | POA: Diagnosis not present

## 2019-04-28 DIAGNOSIS — E785 Hyperlipidemia, unspecified: Secondary | ICD-10-CM

## 2019-04-28 DIAGNOSIS — I10 Essential (primary) hypertension: Secondary | ICD-10-CM | POA: Diagnosis not present

## 2019-04-28 DIAGNOSIS — K2970 Gastritis, unspecified, without bleeding: Secondary | ICD-10-CM

## 2019-04-28 DIAGNOSIS — I6930 Unspecified sequelae of cerebral infarction: Secondary | ICD-10-CM

## 2019-04-28 DIAGNOSIS — K626 Ulcer of anus and rectum: Secondary | ICD-10-CM

## 2019-04-29 NOTE — Telephone Encounter (Signed)
PATIENT SCHEDULED  °

## 2019-04-30 ENCOUNTER — Encounter: Payer: Self-pay | Admitting: Internal Medicine

## 2019-05-01 ENCOUNTER — Encounter: Payer: Self-pay | Admitting: Internal Medicine

## 2019-05-01 ENCOUNTER — Other Ambulatory Visit: Payer: Self-pay | Admitting: Internal Medicine

## 2019-05-01 DIAGNOSIS — D500 Iron deficiency anemia secondary to blood loss (chronic): Secondary | ICD-10-CM | POA: Insufficient documentation

## 2019-05-01 DIAGNOSIS — I6930 Unspecified sequelae of cerebral infarction: Secondary | ICD-10-CM | POA: Insufficient documentation

## 2019-05-01 NOTE — Progress Notes (Signed)
This encounter was created in error - please disregard.

## 2019-05-03 ENCOUNTER — Encounter (HOSPITAL_COMMUNITY)
Admission: RE | Admit: 2019-05-03 | Discharge: 2019-05-03 | Disposition: A | Discharge status: Home / Self Care | Payer: Medicare Other | Source: Skilled Nursing Facility | Attending: *Deleted | Admitting: *Deleted

## 2019-05-03 LAB — HEMOGLOBIN AND HEMATOCRIT, BLOOD
HCT: 27.2 % — ABNORMAL LOW (ref 39.0–52.0)
Hemoglobin: 7.2 g/dL — ABNORMAL LOW (ref 13.0–17.0)

## 2019-05-04 ENCOUNTER — Encounter: Payer: Self-pay | Admitting: Adult Health

## 2019-05-04 ENCOUNTER — Non-Acute Institutional Stay (SKILLED_NURSING_FACILITY): Payer: Medicare Other | Admitting: Adult Health

## 2019-05-04 DIAGNOSIS — I10 Essential (primary) hypertension: Secondary | ICD-10-CM | POA: Diagnosis not present

## 2019-05-04 DIAGNOSIS — I6350 Cerebral infarction due to unspecified occlusion or stenosis of unspecified cerebral artery: Secondary | ICD-10-CM | POA: Diagnosis not present

## 2019-05-04 DIAGNOSIS — E538 Deficiency of other specified B group vitamins: Secondary | ICD-10-CM | POA: Diagnosis not present

## 2019-05-04 NOTE — Progress Notes (Signed)
Location:    [place] Room Number: 144/P Place of Service:  SNF (31)   CODE STATUS: Full Code  No Known Allergies  Chief Complaint  Patient presents with  . Medical Management of Chronic Issues         Vitamin B 12 deficiency  . Essential hypertension   Cerebral artery occlusion with cerebral infarction hemiparesis of right dominant side due to cerebrovascular disease   Weekly follow up for the first 30 days post hospitalization      HPI:  He is a 73 year old short term rehab patient being seen for the management of his chronic illnesses: vit B12 def; hypertension; cva he denies any uncontrolled pain; no changes in appetite;no anxiety no insomnia.   Past Medical History:  Diagnosis Date  . Abnormal EKG 01/29/2015   RBBB and left fascicular block  . Anemia 06/04/2010   Qualifier: Diagnosis of  By: Oneida Alar MD, Sandi L   . Arthritis   . Cerebral artery occlusion with cerebral infarction ([place]) 07/15/2011  . Constipation   . Diastolic dysfunction 123456   Grade 1. EF 65-70%  . Hepatitis C antibody test positive 02/01/2015  . HTN (hypertension)   . Leucopenia 01/30/2015  . Stiffness of joints, not elsewhere classified, multiple sites 11/04/2013  . Stroke ([place])   . Thrombocytopenia ([place]) 01/30/2015    Past Surgical History:  Procedure Laterality Date  . BIOPSY  04/16/2019   Procedure: BIOPSY;  Surgeon: Danie Binder, MD;  [facility];  Service: Endoscopy;;  . COLONOSCOPY  07/2010   FOR ANEMIA-DIVERTICULOSIS  . COLONOSCOPY N/A 04/16/2019   Procedure: COLONOSCOPY;  Surgeon: Danie Binder, MD;  [facility];  Service: Endoscopy;  Laterality: N/A;  . ESOPHAGOGASTRODUODENOSCOPY  07/2010   H PYLORI GASTRITIS  . ESOPHAGOGASTRODUODENOSCOPY (EGD) WITH PROPOFOL N/A 04/13/2019   Procedure: ESOPHAGOGASTRODUODENOSCOPY (EGD) WITH PROPOFOL;  Surgeon: Danie Binder, MD;  [facility];  Service: Endoscopy;  Laterality: N/A;  .  IMPACTION REMOVAL  04/14/2019   Procedure: IMPACTION REMOVAL;  Surgeon: Danie Binder, MD;  [facility];  Service: Endoscopy;;  COLON  . POLYPECTOMY  04/16/2019   Procedure: POLYPECTOMY;  Surgeon: Danie Binder, MD;  [facility];  Service: Endoscopy;;    Social History   Socioeconomic History  . Marital status: Married    Spouse name: Not on file  . Number of children: Not on file  . Years of education: Not on file  . Highest education level: Not on file  Occupational History  . Not on file  Social Needs  . Financial resource strain: Not on file  . Food insecurity    Worry: Not on file    Inability: Not on file  . Transportation needs    Medical: Not on file    Non-medical: Not on file  Tobacco Use  . Smoking status: Current Every Day Smoker    Packs/day: 1.00    Types: Cigarettes  . Smokeless tobacco: Never Used  Substance and Sexual Activity  . Alcohol use: No  . Drug use: No  . Sexual activity: Never  Lifestyle  . Physical activity    Days per week: Not on file    Minutes per session: Not on file  . Stress: Not on file  Relationships  . Social Herbalist on phone: Not on file    Gets together: Not on file    Attends religious  service: Not on file    Active member of club or organization: Not on file    Attends meetings of clubs or organizations: Not on file    Relationship status: Not on file  . Intimate partner violence    Fear of current or ex partner: Not on file    Emotionally abused: Not on file    Physically abused: Not on file    Forced sexual activity: Not on file  Other Topics Concern  . 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 SPENDS FREE TIME: [employer]. BIBLE STUDY. WIFE DOES SHOPPING & DRIVING. [facility] WITH [facility].   Family History  Problem Relation Age of Onset  . Colon polyps Neg Hx   . Colon cancer Neg Hx       VITAL SIGNS BP  110/74   Pulse 78   Temp 98 F (36.7 C) (Oral)   Resp 18   Ht 5\' 9"  (1.753 m)   Wt 142 lb 6.4 oz (64.6 kg)   BMI 21.03 kg/m   Outpatient Encounter Medications as of 05/04/2019  Medication Sig  . aspirin EC 81 MG tablet Take 81 mg by mouth daily.  Marland Kitchen atorvastatin (LIPITOR) 40 MG tablet Take 40 mg by mouth at bedtime.  . cyanocobalamin 1000 MCG tablet Take 1,000 mcg by mouth daily.  Marland Kitchen docusate sodium (COLACE) 100 MG capsule Take 100 mg by mouth daily.  . feeding supplement, ENSURE ENLIVE, (ENSURE ENLIVE) LIQD Take 237 mLs by mouth 2 (two) times daily between meals. Patient likes Chocolate   . ferrous sulfate 325 (65 FE) MG tablet Take 1 tablet (325 mg total) by mouth daily.  . hydrochlorothiazide (HYDRODIURIL) 25 MG tablet Take 25 mg by mouth daily.  . NON FORMULARY Diet - Liquids: __X_Regular;  Diet: Regular, NAS, Consistent Carbohydrate   No facility-administered encounter medications on file as of 05/04/2019.      SIGNIFICANT DIAGNOSTIC EXAMS   PREVIOUS   04-11-19: KUB no acute findings.   04-13-19: EGD: Benign-appearing esophageal STRICTURE   Medium-sized hiatal hernia.  MILD Gastritis. Biopsied.  04-14-19: colonoscopy: - ANEMIA DUE TO RECTAL ULCER AND COLORECTAL POLYPS WHILE TAKING ASPIRIN - MODERATE Diverticulosis in the recto-sigmoid colon, in the sigmoid colon and in thevdescending colon. - Tortuous LEFT AND RIGHT colon.  NO NEW EXAMS.   LABS REVIEWED PREVIOUS:   04-11-19: wbc 6.3; hgb 5.6; hct 23.2; mcv 75.6 ;plt 304; glucose 144; bun 20; creat 1.42; k+ 3.6; na++ 136; ca 9.6 liver normal albumin 4.1 tsh 4.166; vit B 12: 204; iron 8 tibc 393; ferritin 4  04-13-19: wbc 4.2; hgb 8.4; hct 31.6; mcv 78.8; plt 227; glucose 86; bun 11; creat 0.98; k+ 3.8; na++ 140; ca 9.0; mag 2.0 04-15-19: wbc 4.3; hgb 8.4; hct 31.6 mcv 78.8; plt 227 glucose 85; bun 10; creat 0.91; k+ 3.9; na++ 140; ca 8.6 04-17-19: wbc 3.4; hgb 8.1; hct 30.7; mcv 78.9 plt 209; glucose 85; bun 7; creat 1.00; k+ 3.9;  na++ 139; ca 8.5  04-23-19: wbc 3.5; hgb 7.6; hct 27.7; mcv 77.6; plt 220   NO NEW LABS.   Review of Systems  Constitutional: Negative for malaise/fatigue.  Respiratory: Negative for cough and shortness of breath.   Cardiovascular: Negative for chest pain, palpitations and leg swelling.  Gastrointestinal: Negative for abdominal pain, constipation and heartburn.  Musculoskeletal: Negative for back pain, joint pain and myalgias.  Skin: Negative.  Neurological: Negative for dizziness.  Psychiatric/Behavioral: The patient is not nervous/anxious.    Physical Exam Constitutional:      General: He is not in acute distress.    Appearance: He is well-developed. He is not diaphoretic.  Neck:     Musculoskeletal: Neck supple.     Thyroid: No thyromegaly.  Cardiovascular:     Rate and Rhythm: Normal rate and regular rhythm.     Pulses: Normal pulses.     Heart sounds: Normal heart sounds.  Pulmonary:     Effort: Pulmonary effort is normal. No respiratory distress.     Breath sounds: Normal breath sounds.  Abdominal:     General: Bowel sounds are normal. There is no distension.     Palpations: Abdomen is soft.     Tenderness: There is no abdominal tenderness.  Musculoskeletal:     Right lower leg: No edema.     Left lower leg: No edema.     Comments: Is able to move all extremities Has mild right side hemiparesis   Lymphadenopathy:     Cervical: No cervical adenopathy.  Skin:    General: Skin is warm and dry.  Neurological:     Mental Status: He is alert. Mental status is at baseline.  Psychiatric:        Mood and Affect: Mood normal.      ASSESSMENT/ PLAN:  TODAY:  1. Vitamin B 12 deficiency is without change b 12 209 will continue 1000 mcg daily  2. Essential hypertension is stable b/p 110/74 will continue hctz 25 mg daily   3. Cerebral artery occlusion with cerebral infarction hemiparesis of right dominant side due to cerebrovascular disease is neurologically stable  will continue asa 81 mg daily    PREVIOUS  4. Iron deficiency anemia due to chronic blood loss: is stable hgb 7.6 will continue iron daily; is status post blood transfusion in the hospital    5. Dyslipidemia; is stable will continue lipitor 40 mg daily  6. Chronic constipation will continue colace daily   7. Stercoral ulcer of rectum/gastritis Helicobacter pylori/heme positive stools: has completed treatment will monitor . Will monitor      MD is aware of resident's narcotic use and is in agreement with current plan of care. We will attempt to wean resident as appropriate.  Ok Edwards NP [facility]  Contact [phone] Monday through Friday 8am- 5pm  After hours call [phone]

## 2019-05-05 ENCOUNTER — Other Ambulatory Visit: Payer: Self-pay | Admitting: Adult Health

## 2019-05-05 ENCOUNTER — Non-Acute Institutional Stay (SKILLED_NURSING_FACILITY): Payer: Medicare Other | Admitting: Adult Health

## 2019-05-05 ENCOUNTER — Encounter: Payer: Self-pay | Admitting: Adult Health

## 2019-05-05 DIAGNOSIS — I6930 Unspecified sequelae of cerebral infarction: Secondary | ICD-10-CM

## 2019-05-05 DIAGNOSIS — D500 Iron deficiency anemia secondary to blood loss (chronic): Secondary | ICD-10-CM | POA: Diagnosis not present

## 2019-05-05 DIAGNOSIS — K626 Ulcer of anus and rectum: Secondary | ICD-10-CM

## 2019-05-05 DIAGNOSIS — K2970 Gastritis, unspecified, without bleeding: Secondary | ICD-10-CM | POA: Diagnosis not present

## 2019-05-05 DIAGNOSIS — B9681 Helicobacter pylori [H. pylori] as the cause of diseases classified elsewhere: Secondary | ICD-10-CM

## 2019-05-05 MED ORDER — HYDROCHLOROTHIAZIDE 25 MG PO TABS: 25.0000 mg | ORAL_TABLET | Freq: Every day | ORAL | 0 refills | Status: DC

## 2019-05-05 MED ORDER — ATORVASTATIN CALCIUM 40 MG PO TABS: 40.0000 mg | ORAL_TABLET | Freq: Every day | ORAL | 0 refills | Status: DC

## 2019-05-05 NOTE — Progress Notes (Deleted)
Location:    [place] Room Number: 144/P Place of Service:  SNF (31)   CODE STATUS: Full Code  No Known Allergies  Chief Complaint  Patient presents with  . Acute Visit    Care Plan Meeting    HPI:    Past Medical History:  Diagnosis Date  . Abnormal EKG 01/29/2015   RBBB and left fascicular block  . Anemia 06/04/2010   Qualifier: Diagnosis of  By: Oneida Alar MD, Sandi L   . Arthritis   . Cerebral artery occlusion with cerebral infarction ([place]) 07/15/2011  . Constipation   . Diastolic dysfunction 123456   Grade 1. EF 65-70%  . Hepatitis C antibody test positive 02/01/2015  . HTN (hypertension)   . Leucopenia 01/30/2015  . Stiffness of joints, not elsewhere classified, multiple sites 11/04/2013  . Stroke ([place])   . Thrombocytopenia ([place]) 01/30/2015    Past Surgical History:  Procedure Laterality Date  . BIOPSY  04/16/2019   Procedure: BIOPSY;  Surgeon: Danie Binder, MD;  [facility];  Service: Endoscopy;;  . COLONOSCOPY  07/2010   FOR ANEMIA-DIVERTICULOSIS  . COLONOSCOPY N/A 04/16/2019   Procedure: COLONOSCOPY;  Surgeon: Danie Binder, MD;  [facility];  Service: Endoscopy;  Laterality: N/A;  . ESOPHAGOGASTRODUODENOSCOPY  07/2010   H PYLORI GASTRITIS  . ESOPHAGOGASTRODUODENOSCOPY (EGD) WITH PROPOFOL N/A 04/13/2019   Procedure: ESOPHAGOGASTRODUODENOSCOPY (EGD) WITH PROPOFOL;  Surgeon: Danie Binder, MD;  [facility];  Service: Endoscopy;  Laterality: N/A;  . IMPACTION REMOVAL  04/14/2019   Procedure: IMPACTION REMOVAL;  Surgeon: Danie Binder, MD;  [facility];  Service: Endoscopy;;  COLON  . POLYPECTOMY  04/16/2019   Procedure: POLYPECTOMY;  Surgeon: Danie Binder, MD;  [facility];  Service: Endoscopy;;    Social History   Socioeconomic History  . Marital status: Married    Spouse name: Not on file  . Number of children: Not on file  . Years of education: Not on file  .  Highest education level: Not on file  Occupational History  . Not on file  Social Needs  . Financial resource strain: Not on file  . Food insecurity    Worry: Not on file    Inability: Not on file  . Transportation needs    Medical: Not on file    Non-medical: Not on file  Tobacco Use  . Smoking status: Current Every Day Smoker    Packs/day: 1.00    Types: Cigarettes  . Smokeless tobacco: Never Used  Substance and Sexual Activity  . Alcohol use: No  . Drug use: No  . Sexual activity: Never  Lifestyle  . Physical activity    Days per week: Not on file    Minutes per session: Not on file  . Stress: Not on file  Relationships  . Social Herbalist on phone: Not on file    Gets together: Not on file    Attends religious service: Not on file    Active member of club or organization: Not on file    Attends meetings of clubs or organizations: Not on file    Relationship status: Not on file  . Intimate partner violence    Fear of current or ex partner: Not on file    Emotionally abused: Not on file    Physically abused: Not on file    Forced sexual activity: Not on file  Other  Topics Concern  . 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 SPENDS FREE TIME: [employer]. BIBLE STUDY. WIFE DOES SHOPPING & DRIVING. [facility] TO [facility] WITH [facility].   Family History  Problem Relation Age of Onset  . Colon polyps Neg Hx   . Colon cancer Neg Hx       VITAL SIGNS BP 108/63   Pulse 72   Temp 98 F (36.7 C) (Oral)   Resp 20   Ht 5\' 9"  (1.753 m)   Wt 142 lb 6.4 oz (64.6 kg)   BMI 21.03 kg/m   Outpatient Encounter Medications as of 05/05/2019  Medication Sig  . aspirin EC 81 MG tablet Take 81 mg by mouth daily.  Marland Kitchen atorvastatin (LIPITOR) 40 MG tablet Take 40 mg by mouth at bedtime.  . cyanocobalamin 1000 MCG tablet Take 1,000 mcg by mouth daily.  Marland Kitchen docusate sodium (COLACE) 100 MG capsule Take 100 mg by mouth  daily.  . feeding supplement, ENSURE ENLIVE, (ENSURE ENLIVE) LIQD Take 237 mLs by mouth 2 (two) times daily between meals. Patient likes Chocolate   . ferrous sulfate 325 (65 FE) MG tablet Take 1 tablet (325 mg total) by mouth daily.  . hydrochlorothiazide (HYDRODIURIL) 25 MG tablet Take 25 mg by mouth daily.  . NON FORMULARY Diet - Liquids: __X_Regular;  Diet: Regular, NAS, Consistent Carbohydrate   No facility-administered encounter medications on file as of 05/05/2019.      SIGNIFICANT DIAGNOSTIC EXAMS       ASSESSMENT/ PLAN:    MD is aware of resident's narcotic use and is in agreement with current plan of care. We will attempt to wean resident as appropriate.  Ok Edwards NP [facility]  Contact [phone] Monday through Friday 8am- 5pm  After hours call [phone]

## 2019-05-05 NOTE — Telephone Encounter (Signed)
Letter mailed to pt to call for results.  

## 2019-05-05 NOTE — Progress Notes (Signed)
Location:    [facility] Room Number: 144/P Place of Service:  SNF (31)    CODE STATUS: Full Code  No Known Allergies  Chief Complaint  Patient presents with  . Discharge Note    Discharge Visit    HPI:  He is being discharged to home with home health for pt/ot. He will need a front wheel walker. He will need his prescriptions written and will need to follow up with his medical provider. He had been hospitalized for weakness; anemia; gastritis. He was admitted to this facility for short term rehab. He has participated in pt and ot. He is ready to complete his therapy on a home health basis.     Past Medical History:  Diagnosis Date  . Abnormal EKG 01/29/2015   RBBB and left fascicular block  . Anemia 06/04/2010   Qualifier: Diagnosis of  By: Oneida Alar MD, Sandi L   . Arthritis   . Cerebral artery occlusion with cerebral infarction ([place]) 07/15/2011  . Constipation   . Diastolic dysfunction 123456   Grade 1. EF 65-70%  . Hepatitis C antibody test positive 02/01/2015  . HTN (hypertension)   . Leucopenia 01/30/2015  . Stiffness of joints, not elsewhere classified, multiple sites 11/04/2013  . Stroke ([place])   . Thrombocytopenia ([place]) 01/30/2015    Past Surgical History:  Procedure Laterality Date  . BIOPSY  04/16/2019   Procedure: BIOPSY;  Surgeon: Danie Binder, MD;  [facility];  Service: Endoscopy;;  . COLONOSCOPY  07/2010   FOR ANEMIA-DIVERTICULOSIS  . COLONOSCOPY N/A 04/16/2019   Procedure: COLONOSCOPY;  Surgeon: Danie Binder, MD;  [facility];  Service: Endoscopy;  Laterality: N/A;  . ESOPHAGOGASTRODUODENOSCOPY  07/2010   H PYLORI GASTRITIS  . ESOPHAGOGASTRODUODENOSCOPY (EGD) WITH PROPOFOL N/A 04/13/2019   Procedure: ESOPHAGOGASTRODUODENOSCOPY (EGD) WITH PROPOFOL;  Surgeon: Danie Binder, MD;  [facility];  Service: Endoscopy;  Laterality: N/A;  . IMPACTION REMOVAL  04/14/2019   Procedure: IMPACTION REMOVAL;   Surgeon: Danie Binder, MD;  [facility];  Service: Endoscopy;;  COLON  . POLYPECTOMY  04/16/2019   Procedure: POLYPECTOMY;  Surgeon: Danie Binder, MD;  [facility];  Service: Endoscopy;;    Social History   Socioeconomic History  . Marital status: Married    Spouse name: Not on file  . Number of children: Not on file  . Years of education: Not on file  . Highest education level: Not on file  Occupational History  . Not on file  Social Needs  . Financial resource strain: Not on file  . Food insecurity    Worry: Not on file    Inability: Not on file  . Transportation needs    Medical: Not on file    Non-medical: Not on file  Tobacco Use  . Smoking status: Current Every Day Smoker    Packs/day: 1.00    Types: Cigarettes  . Smokeless tobacco: Never Used  Substance and Sexual Activity  . Alcohol use: No  . Drug use: No  . Sexual activity: Never  Lifestyle  . Physical activity    Days per week: Not on file    Minutes per session: Not on file  . Stress: Not on file  Relationships  . Social Herbalist on phone: Not on file    Gets together: Not on file    Attends religious service: Not on file  Active member of club or organization: Not on file    Attends meetings of clubs or organizations: Not on file    Relationship status: Not on file  . Intimate partner violence    Fear of current or ex partner: Not on file    Emotionally abused: Not on file    Physically abused: Not on file    Forced sexual activity: Not on file  Other Topics Concern  . 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 SPENDS FREE TIME: [employer]. BIBLE STUDY. WIFE DOES SHOPPING & DRIVING. [facility].   Family History  Problem Relation Age of Onset  . Colon polyps Neg Hx   . Colon cancer Neg Hx     VITAL SIGNS BP 108/63   Pulse 72   Temp 98 F (36.7 C) (Oral)   Resp 20    Ht 5\' 9"  (1.753 m)   Wt 142 lb 6.4 oz (64.6 kg)   BMI 21.03 kg/m   Patient's Medications  New Prescriptions   No medications on file  Previous Medications   ASPIRIN EC 81 MG TABLET    Take 81 mg by mouth daily.   ATORVASTATIN (LIPITOR) 40 MG TABLET    Take 40 mg by mouth at bedtime.   CYANOCOBALAMIN 1000 MCG TABLET    Take 1,000 mcg by mouth daily.   DOCUSATE SODIUM (COLACE) 100 MG CAPSULE    Take 100 mg by mouth daily.   FEEDING SUPPLEMENT, ENSURE ENLIVE, (ENSURE ENLIVE) LIQD    Take 237 mLs by mouth 2 (two) times daily between meals. Patient likes Chocolate    FERROUS SULFATE 325 (65 FE) MG TABLET    Take 1 tablet (325 mg total) by mouth daily.   HYDROCHLOROTHIAZIDE (HYDRODIURIL) 25 MG TABLET    Take 25 mg by mouth daily.   NON FORMULARY    Diet - Liquids: __X_Regular;  Diet: Regular, NAS, Consistent Carbohydrate  Modified Medications   No medications on file  Discontinued Medications   No medications on file     SIGNIFICANT DIAGNOSTIC EXAMS   PREVIOUS   04-11-19: KUB no acute findings.   04-13-19: EGD: Benign-appearing esophageal STRICTURE   Medium-sized hiatal hernia.  MILD Gastritis. Biopsied.  04-14-19: colonoscopy: - ANEMIA DUE TO RECTAL ULCER AND COLORECTAL POLYPS WHILE TAKING ASPIRIN - MODERATE Diverticulosis in the recto-sigmoid colon, in the sigmoid colon and in thevdescending colon. - Tortuous LEFT AND RIGHT colon.  NO NEW EXAMS.   LABS REVIEWED PREVIOUS:   04-11-19: wbc 6.3; hgb 5.6; hct 23.2; mcv 75.6 ;plt 304; glucose 144; bun 20; creat 1.42; k+ 3.6; na++ 136; ca 9.6 liver normal albumin 4.1 tsh 4.166; vit B 12: 204; iron 8 tibc 393; ferritin 4  04-13-19: wbc 4.2; hgb 8.4; hct 31.6; mcv 78.8; plt 227; glucose 86; bun 11; creat 0.98; k+ 3.8; na++ 140; ca 9.0; mag 2.0 04-15-19: wbc 4.3; hgb 8.4; hct 31.6 mcv 78.8; plt 227 glucose 85; bun 10; creat 0.91; k+ 3.9; na++ 140; ca 8.6 04-17-19: wbc 3.4; hgb 8.1; hct 30.7; mcv 78.9 plt 209; glucose 85; bun 7; creat 1.00; k+  3.9; na++ 139; ca 8.5  04-23-19: wbc 3.5; hgb 7.6; hct 27.7; mcv 77.6; plt 220   NO NEW LABS.    Review of Systems  Constitutional: Negative for malaise/fatigue.  Respiratory: Negative for cough and shortness of breath.   Cardiovascular: Negative for chest pain, palpitations  and leg swelling.  Gastrointestinal: Negative for abdominal pain, constipation and heartburn.  Musculoskeletal: Negative for back pain, joint pain and myalgias.  Skin: Negative.   Neurological: Negative for dizziness.  Psychiatric/Behavioral: The patient is not nervous/anxious.     Physical Exam Constitutional:      General: He is not in acute distress.    Appearance: He is well-developed. He is not diaphoretic.  Neck:     Musculoskeletal: Neck supple.     Thyroid: No thyromegaly.  Cardiovascular:     Rate and Rhythm: Normal rate and regular rhythm.     Pulses: Normal pulses.     Heart sounds: Normal heart sounds.  Pulmonary:     Effort: Pulmonary effort is normal. No respiratory distress.     Breath sounds: Normal breath sounds.  Abdominal:     General: Bowel sounds are normal. There is no distension.     Palpations: Abdomen is soft.     Tenderness: There is no abdominal tenderness.  Musculoskeletal:     Right lower leg: No edema.     Left lower leg: No edema.     Comments:  Is able to move all extremities Has mild right side hemiparesis    Lymphadenopathy:     Cervical: No cervical adenopathy.  Skin:    General: Skin is warm and dry.  Neurological:     Mental Status: He is alert. Mental status is at baseline.  Psychiatric:        Mood and Affect: Mood normal.     ASSESSMENT/ PLAN:  Patient is being discharged with the following home health services:  Pt/ot to evaluate and treat as indicated for gait balance strength adl training   Patient is being discharged with the following durable medical equipment:  Front wheel walker to allow him to maintain his current level of independence with his  adls.   Patient has been advised to f/u with their PCP in 1-2 weeks to bring them up to date on their rehab stay.  Social services at facility was responsible for arranging this appointment.  Pt was provided with a 30 day supply of prescriptions for medications and refills must be obtained from their PCP.  For controlled substances, a more limited supply may be provided adequate until PCP appointment only.   A 30 day supply of his prescription medications have been sent to [facility]  Time spent with patient: 35 minutes: medications; home health; dme.   Ok Edwards NP [facility]  Contact [phone] Monday through Friday 8am- 5pm  After hours call [phone]

## 2019-05-11 ENCOUNTER — Encounter (HOSPITAL_COMMUNITY)
Admission: RE | Admit: 2019-05-11 | Discharge: 2019-05-11 | Disposition: A | Discharge status: Home / Self Care | Payer: Medicare Other | Source: Skilled Nursing Facility | Attending: Internal Medicine | Admitting: Internal Medicine

## 2019-05-11 DIAGNOSIS — I69398 Other sequelae of cerebral infarction: Secondary | ICD-10-CM | POA: Insufficient documentation

## 2019-05-11 DIAGNOSIS — B9681 Helicobacter pylori [H. pylori] as the cause of diseases classified elsewhere: Secondary | ICD-10-CM | POA: Insufficient documentation

## 2019-05-11 DIAGNOSIS — I6350 Cerebral infarction due to unspecified occlusion or stenosis of unspecified cerebral artery: Secondary | ICD-10-CM | POA: Insufficient documentation

## 2019-06-24 ENCOUNTER — Other Ambulatory Visit: Payer: Self-pay

## 2019-06-24 DIAGNOSIS — D509 Iron deficiency anemia, unspecified: Secondary | ICD-10-CM

## 2019-06-24 DIAGNOSIS — K2970 Gastritis, unspecified, without bleeding: Secondary | ICD-10-CM

## 2019-06-24 DIAGNOSIS — B9681 Helicobacter pylori [H. pylori] as the cause of diseases classified elsewhere: Secondary | ICD-10-CM

## 2019-10-11 NOTE — Progress Notes (Signed)
Referring Provider: Beacher May, MD Primary Care Physician:  Beacher May, MD Primary GI: Dr. Oneida Alar   Chief Complaint  Patient presents with  . Anemia    doing ok    HPI:   TARICK STANCIL is a 74 y.o. male presenting today with a history of  profound IDA (Hgb 5.6 in Sept 2020), hospitalized at [facility]. EGD on September 8.  1 benign-appearing, intrinsic moderate stenosis.  Medium sized hiatal hernia.  Mild gastritis status post biopsy. + H.pylori. TCS Sept 2020: rectal ulcer (due to fecal impaction) and polyps ( 2 serrated and seven simple tubular adenomas). Moderate diverticulosis in rectosigmoid, sigmoid, and descending colon. Prescribed Amoxicillin/Biaxin, Protonix for 10 days.   Niece, Hermina Staggers, is present with him today. Denies rectal bleeding, constipation, N/V, dysphagia, GERD. Has good appetite. No longer on PPI. Still taking iron. Needs blood work done. History of H.pylori gastritis in remote past as well. 81 mg aspirin daily.   Past Medical History:  Diagnosis Date  . Abnormal EKG 01/29/2015   RBBB and left fascicular block  . Anemia 06/04/2010   Qualifier: Diagnosis of  By: Oneida Alar MD, Sandi L   . Arthritis   . Cerebral artery occlusion with cerebral infarction ([place]) 07/15/2011  . Constipation   . Diastolic dysfunction 123456   Grade 1. EF 65-70%  . Hepatitis C antibody test positive 02/01/2015  . HTN (hypertension)   . Leucopenia 01/30/2015  . Stiffness of joints, not elsewhere classified, multiple sites 11/04/2013  . Stroke ([place])   . Thrombocytopenia ([place]) 01/30/2015    Past Surgical History:  Procedure Laterality Date  . BIOPSY  04/16/2019   Procedure: BIOPSY;  Surgeon: Danie Binder, MD;  [facility];  Service: Endoscopy;;  . COLONOSCOPY  07/2010   FOR ANEMIA-DIVERTICULOSIS  . COLONOSCOPY N/A 04/16/2019    rectal ulcer (due to fecal impaction) and polyps ( 2 serrated and seven simple tubular adenomas). Moderate diverticulosis  in rectosigmoid, sigmoid, and descending colon.  . ESOPHAGOGASTRODUODENOSCOPY  07/2010   H PYLORI GASTRITIS  . ESOPHAGOGASTRODUODENOSCOPY (EGD) WITH PROPOFOL N/A 04/13/2019   1 benign-appearing, intrinsic moderate stenosis.  Medium sized hiatal hernia.  Mild gastritis status post biopsy. + H.pylori.  Marland Kitchen IMPACTION REMOVAL  04/14/2019   Procedure: IMPACTION REMOVAL;  Surgeon: Danie Binder, MD;  [facility];  Service: Endoscopy;;  COLON  . POLYPECTOMY  04/16/2019   Procedure: POLYPECTOMY;  Surgeon: Danie Binder, MD;  [facility];  Service: Endoscopy;;    Current Outpatient Medications  Medication Sig Dispense Refill  . aspirin EC 81 MG tablet Take 81 mg by mouth daily.    Marland Kitchen atorvastatin (LIPITOR) 40 MG tablet Take 1 tablet (40 mg total) by mouth at bedtime. 30 tablet 0  . cyanocobalamin 1000 MCG tablet Take 1,000 mcg by mouth daily.    Marland Kitchen docusate sodium (COLACE) 100 MG capsule Take 100 mg by mouth daily.    . feeding supplement, ENSURE ENLIVE, (ENSURE ENLIVE) LIQD Take 237 mLs by mouth 2 (two) times daily between meals. Patient likes Chocolate     . ferrous sulfate 325 (65 FE) MG tablet Take 1 tablet (325 mg total) by mouth daily. 30 tablet 3  . hydrochlorothiazide (HYDRODIURIL) 25 MG tablet Take 1 tablet (25 mg total) by mouth daily. 30 tablet 0  . NON FORMULARY Diet - Liquids: __X_Regular;  Diet: Regular, NAS, Consistent Carbohydrate    . pantoprazole (PROTONIX) 40 MG tablet  Take 1 tablet (40 mg total) by mouth daily. 30 minutes before breakfast 90 tablet 3   No current facility-administered medications for this visit.    Allergies as of 10/12/2019  . (No Known Allergies)    Family History  Problem Relation Age of Onset  . Colon polyps Neg Hx   . Colon cancer Neg Hx     Social History   Socioeconomic History  . Marital status: Married    Spouse name: Not on file  . Number of children: Not on file  . Years of education: Not on file  . Highest  education level: Not on file  Occupational History  . Not on file  Tobacco Use  . Smoking status: Current Every Day Smoker    Packs/day: 1.00    Types: Cigarettes  . Smokeless tobacco: Never Used  Substance and Sexual Activity  . Alcohol use: No  . Drug use: No  . Sexual activity: Never  Other Topics Concern  . 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 SPENDS FREE TIME: [employer]. BIBLE STUDY. WIFE DOES SHOPPING & DRIVING. [facility] WITH [facility].   Social Determinants of Health   Financial Resource Strain:   . Difficulty of Paying Living Expenses: Not on file  Food Insecurity:   . Worried About Charity fundraiser in the Last Year: Not on file  . Ran Out of Food in the Last Year: Not on file  Transportation Needs:   . Lack of Transportation (Medical): Not on file  . Lack of Transportation (Non-Medical): Not on file  Physical Activity:   . Days of Exercise per Week: Not on file  . Minutes of Exercise per Session: Not on file  Stress:   . Feeling of Stress : Not on file  Social Connections:   . Frequency of Communication with Friends and Family: Not on file  . Frequency of Social Gatherings with Friends and Family: Not on file  . Attends Religious Services: Not on file  . Active Member of Clubs or Organizations: Not on file  . Attends Archivist Meetings: Not on file  . Marital Status: Not on file    Review of Systems: Gen: Denies fever, chills, anorexia. Denies fatigue, weakness, weight loss.  CV: Denies chest pain, palpitations, syncope, peripheral edema, and claudication. Resp: Denies dyspnea at rest, cough, wheezing, coughing up blood, and pleurisy. GI: see HPI Derm: Denies rash, itching, dry skin Psych: Denies depression, anxiety, memory loss, confusion. No homicidal or suicidal ideation.  Heme: Denies bruising, bleeding, and enlarged lymph nodes.  Physical Exam: BP 133/75   Pulse  86   Temp (!) 96.6 F (35.9 C) (Temporal)   Ht 5\' 9"  (1.753 m)   Wt 169 lb 6.4 oz (76.8 kg)   BMI 25.02 kg/m  General:   Alert and oriented. No distress noted. Pleasant and cooperative.  Head:  Normocephalic and atraumatic. Eyes:  Conjuctiva clear without scleral icterus. Abdomen:  +BS, soft, non-tender and non-distended. Limited exam with patient sitting in chair. Msk:  Uses cane for ambulation Extremities:  Without edema. Neurologic:  Alert and  oriented x4 Psych:  Alert and cooperative. Normal mood and affect.  ASSESSMENT: LEHI CORNS is a 74 y.o. male presenting today with history of profound anemia requiring hospitalization Sept 2020, with findings of H.pylori gastritis on EGD and rectal ulcer due to fecal impaction on colonoscopy. Joseph Art, his niece, is  present with him today. Clinically, he is doing well with no reported overt GI bleeding, notes good appetite, no constipation, and states he completed antibiotic therapy for H.pylori. We will need to document eradication, as he has been treated for this twice (previously in 2011 it appears).   Will check urea breath test, as he has been off a PPI for some time. Update CBC, iron studies today. I would like for him to start Protonix once daily after urea breath test completed. He is on 81 mg aspirin daily.    PLAN:   Urea breath test   CBC, iron studies  Start Protonix after breath test completed  Return in 6 months  Annitta Needs, PhD, [facility]

## 2019-10-12 ENCOUNTER — Ambulatory Visit (INDEPENDENT_AMBULATORY_CARE_PROVIDER_SITE_OTHER): Payer: Medicare Other | Admitting: Gastroenterology

## 2019-10-12 ENCOUNTER — Encounter: Payer: Self-pay | Admitting: Internal Medicine

## 2019-10-12 ENCOUNTER — Other Ambulatory Visit: Payer: Self-pay

## 2019-10-12 ENCOUNTER — Encounter: Payer: Self-pay | Admitting: Gastroenterology

## 2019-10-12 VITALS — BP 133/75 | HR 86 | Temp 96.6°F | Ht 69.0 in | Wt 169.4 lb

## 2019-10-12 DIAGNOSIS — D509 Iron deficiency anemia, unspecified: Secondary | ICD-10-CM

## 2019-10-12 MED ORDER — PANTOPRAZOLE SODIUM 40 MG PO TBEC: 40.0000 mg | DELAYED_RELEASE_TABLET | Freq: Every day | ORAL | 3 refills | Status: DC

## 2019-10-12 NOTE — Patient Instructions (Signed)
Please complete blood work and breath test in the next few days, first thing in the morning without eating or drinking.  I have sent in a medication called Protonix (pantoprazole) to take AFTER you finish breath test. This is help to protect your stomach.  We will see you in 6 months!  It was a pleasure to see you today. I want to create trusting relationships with patients to provide genuine, compassionate, and quality care. I value your feedback. If you receive a survey regarding your visit,  I greatly appreciate you taking time to fill this out.   Annitta Needs, PhD, ANP-BC [facility]

## 2019-10-14 ENCOUNTER — Other Ambulatory Visit: Payer: Self-pay | Admitting: *Deleted

## 2019-10-14 DIAGNOSIS — D509 Iron deficiency anemia, unspecified: Secondary | ICD-10-CM

## 2019-10-14 LAB — IRON,TIBC AND FERRITIN PANEL
%SAT: 24 % (calc) (ref 20–48)
Ferritin: 54 ng/mL (ref 24–380)
Iron: 66 ug/dL (ref 50–180)
TIBC: 273 mcg/dL (calc) (ref 250–425)

## 2019-10-14 LAB — CBC WITH DIFFERENTIAL/PLATELET
Absolute Monocytes: 531 cells/uL (ref 200–950)
Basophils Absolute: 19 cells/uL (ref 0–200)
Basophils Relative: 0.4 %
Eosinophils Absolute: 108 cells/uL (ref 15–500)
Eosinophils Relative: 2.3 %
HCT: 41.3 % (ref 38.5–50.0)
Hemoglobin: 13.6 g/dL (ref 13.2–17.1)
Lymphs Abs: 1241 cells/uL (ref 850–3900)
MCH: 31.9 pg (ref 27.0–33.0)
MCHC: 32.9 g/dL (ref 32.0–36.0)
MCV: 96.9 fL (ref 80.0–100.0)
MPV: 13.4 fL — ABNORMAL HIGH (ref 7.5–12.5)
Monocytes Relative: 11.3 %
Neutro Abs: 2801 cells/uL (ref 1500–7800)
Neutrophils Relative %: 59.6 %
Platelets: 178 10*3/uL (ref 140–400)
RBC: 4.26 10*6/uL (ref 4.20–5.80)
RDW: 11.9 % (ref 11.0–15.0)
Total Lymphocyte: 26.4 %
WBC: 4.7 10*3/uL (ref 3.8–10.8)

## 2019-10-14 LAB — H. PYLORI BREATH TEST: H. pylori Breath Test: NOT DETECTED

## 2019-10-15 ENCOUNTER — Telehealth: Payer: Self-pay | Admitting: Gastroenterology

## 2019-10-15 NOTE — Telephone Encounter (Signed)
Called pt and reviewed results with him.

## 2019-10-15 NOTE — Telephone Encounter (Signed)
Pt returning call. Please call before 1030. [phone]

## 2019-11-08 ENCOUNTER — Other Ambulatory Visit: Payer: Self-pay

## 2019-11-08 DIAGNOSIS — D509 Iron deficiency anemia, unspecified: Secondary | ICD-10-CM

## 2020-04-13 ENCOUNTER — Ambulatory Visit: Payer: Medicare Other | Admitting: Gastroenterology

## 2020-05-27 ENCOUNTER — Other Ambulatory Visit: Payer: Self-pay

## 2020-05-27 ENCOUNTER — Emergency Department (HOSPITAL_COMMUNITY)
Admission: EM | Admit: 2020-05-27 | Discharge: 2020-05-27 | Disposition: A | Discharge status: Home / Self Care | Payer: No Typology Code available for payment source | Attending: Emergency Medicine | Admitting: Emergency Medicine

## 2020-05-27 DIAGNOSIS — I452 Bifascicular block: Secondary | ICD-10-CM | POA: Insufficient documentation

## 2020-05-27 DIAGNOSIS — R9431 Abnormal electrocardiogram [ECG] [EKG]: Secondary | ICD-10-CM | POA: Diagnosis present

## 2020-05-27 DIAGNOSIS — I10 Essential (primary) hypertension: Secondary | ICD-10-CM | POA: Insufficient documentation

## 2020-05-27 DIAGNOSIS — Z7982 Long term (current) use of aspirin: Secondary | ICD-10-CM | POA: Diagnosis not present

## 2020-05-27 DIAGNOSIS — Z79899 Other long term (current) drug therapy: Secondary | ICD-10-CM | POA: Diagnosis not present

## 2020-05-27 DIAGNOSIS — F17210 Nicotine dependence, cigarettes, uncomplicated: Secondary | ICD-10-CM | POA: Insufficient documentation

## 2020-05-27 LAB — CBC
HCT: 41.9 % (ref 39.0–52.0)
Hemoglobin: 12.7 g/dL — ABNORMAL LOW (ref 13.0–17.0)
MCH: 29.2 pg (ref 26.0–34.0)
MCHC: 30.3 g/dL (ref 30.0–36.0)
MCV: 96.3 fL (ref 80.0–100.0)
Platelets: 198 10*3/uL (ref 150–400)
RBC: 4.35 MIL/uL (ref 4.22–5.81)
RDW: 18.2 % — ABNORMAL HIGH (ref 11.5–15.5)
WBC: 5.1 10*3/uL (ref 4.0–10.5)
nRBC: 0 % (ref 0.0–0.2)

## 2020-05-27 LAB — BASIC METABOLIC PANEL
Anion gap: 8 (ref 5–15)
BUN: 17 mg/dL (ref 8–23)
CO2: 28 mmol/L (ref 22–32)
Calcium: 9.4 mg/dL (ref 8.9–10.3)
Chloride: 103 mmol/L (ref 98–111)
Creatinine, Ser: 1.34 mg/dL — ABNORMAL HIGH (ref 0.61–1.24)
GFR, Estimated: 56 mL/min — ABNORMAL LOW (ref >60)
Glucose, Bld: 98 mg/dL (ref 70–99)
Potassium: 4 mmol/L (ref 3.5–5.1)
Sodium: 139 mmol/L (ref 135–145)

## 2020-05-27 NOTE — ED Notes (Signed)
Not in WR

## 2020-05-27 NOTE — Discharge Instructions (Signed)
It was our pleasure to provide your ER care today - we hope that you feel better.  We discussed your case with our cardiologist - they indicate for you to follow up with them in their office this week, in the next few days - call office tomorrow morning to arrange  appointment time (when you call, tell them that you were in the ER today, and that your recent Holter exam was noted to show a brief period of complete heart block, and that we discussed your case with our on-call cardiologist today, who wanted them to see you in the office this week).  Return to ER if worse, new symptoms, fainting or passing out, weak/dizzy, chest pain, trouble breathing, or other concern.

## 2020-05-27 NOTE — ED Triage Notes (Signed)
Pt was wearing a holter monitor for 2 weeks. Had it taken off 2 weeks ago. [place] contacted wife today to let her know that there was a complete heart block for 32 seconds and that he needed to be seen in the ED because he could need a pacemaker.

## 2020-05-27 NOTE — ED Provider Notes (Signed)
[facility] EMERGENCY DEPARTMENT Provider Note   CSN: [id number] Arrival date & time: 05/27/20  1409     History Chief Complaint  Patient presents with  . Cardiac Issue    Eric [name] is a 74 y.o. male.  Patient presents after someone at the [facility] called and instructed patient to go to ED. Pt/family indicate two weeks ago he completed wearing his Holter monitor, and states today they called to say there was an ~ 30 second period of complete heart block. Patient denies feeling faint or any recent episodes of syncope. Pt denies any chest pain or discomfort. No sob. Pt states he feels fine, at baseline.   The history is provided by the patient.       Past Medical History:  Diagnosis Date  . Abnormal EKG 01/29/2015   RBBB and left fascicular block  . Anemia 06/04/2010   Qualifier: Diagnosis of  By: Oneida Alar MD, Sandi L   . Arthritis   . Cerebral artery occlusion with cerebral infarction ([place]) 07/15/2011  . Constipation   . Diastolic dysfunction 7/84/6962   Grade 1. EF 65-70%  . Hepatitis C antibody test positive 02/01/2015  . HTN (hypertension)   . Leucopenia 01/30/2015  . Stiffness of joints, not elsewhere classified, multiple sites 11/04/2013  . Stroke ([place])   . Thrombocytopenia ([place]) 01/30/2015    Patient Active Problem List   Diagnosis Date Noted  . Chronic blood loss anemia 05/01/2019  . History of cerebrovascular accident (CVA) with residual deficit 05/01/2019  . Hemiparesis of right dominant side due to cerebrovascular disease ([place]) 04/19/2019  . Dyslipidemia 04/19/2019  . Chronic constipation 04/19/2019  . Vitamin B12 deficiency 04/19/2019  . Stercoral ulcer of rectum 04/19/2019  . Gastritis, Helicobacter pylori 95/28/4132  . Iron deficiency anemia   . Gastrointestinal hemorrhage   . Heme positive stool 04/11/2019  . Orthostatic hypotension 04/11/2019  . Current smoker 04/11/2019  . Hepatitis C antibody test positive 02/01/2015  . Diastolic dysfunction  44/08/270  . Leucopenia 01/30/2015  . Thrombocytopenia ([place]) 01/30/2015  . Generalized weakness 01/29/2015  . Diaphoresis 01/29/2015  . Essential hypertension 01/29/2015  . Abnormal EKG 01/29/2015  . Acute kidney injury ([place]) 01/29/2015  . Hyperglycemia 01/29/2015  . UTI (urinary tract infection), bacterial 01/29/2015  . Difficulty walking 11/04/2013  . Stiffness of joints, not elsewhere classified, multiple sites 11/04/2013  . Cerebral artery occlusion with cerebral infarction ([place]) 07/15/2011  . Lack of coordination 07/15/2011  . Symptomatic anemia 06/04/2010    Past Surgical History:  Procedure Laterality Date  . BIOPSY  04/16/2019   Procedure: BIOPSY;  Surgeon: Danie Binder, MD;  [facility];  Service: Endoscopy;;  . COLONOSCOPY  07/2010   FOR ANEMIA-DIVERTICULOSIS  . COLONOSCOPY N/A 04/16/2019    rectal ulcer (due to fecal impaction) and polyps ( 2 serrated and seven simple tubular adenomas). Moderate diverticulosis in rectosigmoid, sigmoid, and descending colon.  . ESOPHAGOGASTRODUODENOSCOPY  07/2010   H PYLORI GASTRITIS  . ESOPHAGOGASTRODUODENOSCOPY (EGD) WITH PROPOFOL N/A 04/13/2019   1 benign-appearing, intrinsic moderate stenosis.  Medium sized hiatal hernia.  Mild gastritis status post biopsy. + H.pylori.  Marland Kitchen IMPACTION REMOVAL  04/14/2019   Procedure: IMPACTION REMOVAL;  Surgeon: Danie Binder, MD;  Location[facility];  Service: Endoscopy;;  COLON  . POLYPECTOMY  04/16/2019   Procedure: POLYPECTOMY;  Surgeon: Danie Binder, MD;  Location[facility];  Service: Endoscopy;;       Family History  Problem Relation  Age of Onset  . Colon polyps Neg Hx   . Colon cancer Neg Hx     Social History   Tobacco Use  . Smoking status: Current Every Day Smoker    Packs/day: 1.00    Types: Cigarettes  . Smokeless tobacco: Never Used  Substance Use Topics  . Alcohol use: No  . Drug use: No    Home Medications Prior to Admission medications     Medication Sig Start Date End Date Taking? Authorizing Provider  aspirin EC 81 MG tablet Take 81 mg by mouth daily.    [provider]  atorvastatin (LIPITOR) 40 MG tablet Take 1 tablet (40 mg total) by mouth at bedtime. 05/05/19   Gerlene Fee, NP  cyanocobalamin 1000 MCG tablet Take 1,000 mcg by mouth daily.    [provider]  docusate sodium (COLACE) 100 MG capsule Take 100 mg by mouth daily.    [provider]  feeding supplement, ENSURE ENLIVE, (ENSURE ENLIVE) LIQD Take 237 mLs by mouth 2 (two) times daily between meals. Patient likes Chocolate     [provider]  ferrous sulfate 325 (65 FE) MG tablet Take 1 tablet (325 mg total) by mouth daily. 04/17/19 04/16/20  Manuella Ghazi, Pratik D, DO  hydrochlorothiazide (HYDRODIURIL) 25 MG tablet Take 1 tablet (25 mg total) by mouth daily. 05/05/19   Gerlene Fee, NP  NON FORMULARY Diet - Liquids: __X_Regular;  Diet: Regular, NAS, Consistent Carbohydrate    [provider]  pantoprazole (PROTONIX) 40 MG tablet Take 1 tablet (40 mg total) by mouth daily. 30 minutes before breakfast 10/12/19   Annitta Needs, NP    Allergies    Patient has no known allergies.  Review of Systems   Review of Systems  Constitutional: Negative for fever.  HENT: Negative for sore throat.   Eyes: Negative for visual disturbance.  Respiratory: Negative for shortness of breath.   Cardiovascular: Negative for chest pain and palpitations.  Gastrointestinal: Negative for abdominal pain, nausea and vomiting.  Genitourinary: Negative for flank pain.  Musculoskeletal: Negative for back pain and neck pain.  Skin: Negative for rash.  Neurological: Negative for dizziness, syncope, light-headedness and headaches.  Hematological: Does not bruise/bleed easily.  Psychiatric/Behavioral: Negative for confusion.    Physical Exam Updated Vital Signs BP (!) 141/83 (BP Location: Right Arm)   Pulse 73   Temp 97.7 F (36.5 C) (Oral)    Resp (!) 26   SpO2 97%   Physical Exam Vitals and nursing note reviewed.  Constitutional:      Appearance: Normal appearance. He is well-developed.  HENT:     Head: Atraumatic.     Nose: Nose normal.     Mouth/Throat:     Mouth: Mucous membranes are moist.     Pharynx: Oropharynx is clear.  Eyes:     General: No scleral icterus.    Conjunctiva/sclera: Conjunctivae normal.     Pupils: Pupils are equal, round, and reactive to light.  Neck:     Trachea: No tracheal deviation.  Cardiovascular:     Rate and Rhythm: Normal rate and regular rhythm.     Pulses: Normal pulses.     Heart sounds: Normal heart sounds. No murmur heard.  No friction rub. No gallop.   Pulmonary:     Effort: Pulmonary effort is normal. No accessory muscle usage or respiratory distress.     Breath sounds: Normal breath sounds.  Abdominal:     General: Bowel sounds are normal.  There is no distension.     Palpations: Abdomen is soft.     Tenderness: There is no abdominal tenderness. There is no guarding.  Genitourinary:    Comments: No cva tenderness. Musculoskeletal:        General: No swelling or tenderness.     Cervical back: Normal range of motion and neck supple. No rigidity.  Skin:    General: Skin is warm and dry.     Findings: No rash.  Neurological:     Mental Status: He is alert.     Comments: Alert, speech clear.   Psychiatric:        Mood and Affect: Mood normal.     ED Results / Procedures / Treatments   Labs (all labs ordered are listed, but only abnormal results are displayed) Results for orders placed or performed during the hospital encounter of 05/27/20  CBC  Result Value Ref Range   WBC 5.1 4.0 - 10.5 K/uL   RBC 4.35 4.22 - 5.81 MIL/uL   Hemoglobin 12.7 (L) 13.0 - 17.0 g/dL   HCT 41.9 39 - 52 %   MCV 96.3 80.0 - 100.0 fL   MCH 29.2 26.0 - 34.0 pg   MCHC 30.3 30.0 - 36.0 g/dL   RDW 18.2 (H) 11.5 - 15.5 %   Platelets 198 150 - 400 K/uL   nRBC 0.0 0.0 - 0.2 %  Basic  metabolic panel  Result Value Ref Range   Sodium 139 135 - 145 mmol/L   Potassium 4.0 3.5 - 5.1 mmol/L   Chloride 103 98 - 111 mmol/L   CO2 28 22 - 32 mmol/L   Glucose, Bld 98 70 - 99 mg/dL   BUN 17 8 - 23 mg/dL   Creatinine, Ser 1.34 (H) 0.61 - 1.24 mg/dL   Calcium 9.4 8.9 - 10.3 mg/dL   GFR, Estimated 56 (L) >60 mL/min   Anion gap 8 5 - 15    EKG EKG Interpretation  Date/Time:  Saturday May 27 2020 14:22:36 EDT Ventricular Rate:  73 PR Interval:    QRS Duration: 150 QT Interval:  456 QTC Calculation: 503 R Axis:   -79 Text Interpretation: Sinus rhythm Prolonged PR interval RBBB and LAFB Confirmed by Lajean Saver [id number]) on 05/27/2020 3:05:11 PM   Radiology No results found.  Procedures Procedures (including critical care time)  Medications Ordered in ED Medications - No data to display  ED Course  I have reviewed the triage vital signs and the nursing notes.  Pertinent labs & imaging results that were available during my care of the patient were reviewed by me and considered in my medical decision making (see chart for details).    MDM Rules/Calculators/A&P                         Continuous pulse ox and cardiac monitoring - sinus rhythm.   Reviewed nursing notes and prior charts for additional history.   Labs reviewed/interpreted by me - chem normal. hct 41, c/w prior.   Discussed pt with Dr Pamala Hurry, resident at [facility] - he indicates patient had holter for bifascicular block, that patient was asymptomatic, no syncope or dizziness, and that holter had shown a 32 second period of CHB. He indicates patient may go there for evaluation for pacemaker, or stay at [facility], to discuss w pt.   Patient indicates would prefer to see cardiology locally, in [place] - will consult cardiology on call.  Recheck pt, remains asymptomatic, in sinus rhythm.   Discussed with our cardiologist on call, including report of 32 second episode chb when on  Holter, in sinus, etc.  He indicates no need for admission or emergent pacemaker today, states to have f/u in office this week.   Recheck pt, in sinus rhythm, asymptomatic, no dizziness or faintness. No cp or discomfort. No sob or unusual doe.    Pt currently appears stable for d/c. Return precautions provided.     Final Clinical Impression(s) / ED Diagnoses Final diagnoses:  None    Rx / DC Orders ED Discharge Orders    None       Lajean Saver, MD 05/27/20 4742

## 2020-06-06 ENCOUNTER — Other Ambulatory Visit: Payer: Self-pay

## 2020-06-06 ENCOUNTER — Encounter: Payer: Self-pay | Admitting: Cardiology

## 2020-06-06 ENCOUNTER — Ambulatory Visit (INDEPENDENT_AMBULATORY_CARE_PROVIDER_SITE_OTHER): Payer: Medicare Other | Admitting: Cardiology

## 2020-06-06 VITALS — BP 132/72 | HR 86 | Ht 69.0 in | Wt 181.0 lb

## 2020-06-06 DIAGNOSIS — I4729 Other ventricular tachycardia: Secondary | ICD-10-CM

## 2020-06-06 DIAGNOSIS — R001 Bradycardia, unspecified: Secondary | ICD-10-CM | POA: Diagnosis not present

## 2020-06-06 DIAGNOSIS — I472 Ventricular tachycardia: Secondary | ICD-10-CM

## 2020-06-06 NOTE — Patient Instructions (Addendum)
Medication Instructions:  Your physician recommends that you continue on your current medications as directed. Please refer to the Current Medication list given to you today. *If you need a refill on your cardiac medications before your next appointment, please call your pharmacy*  Lab Work: None ordered. If you have labs (blood work) drawn today and your tests are completely normal, you will receive your results only by: Marland Kitchen MyChart Message (if you have MyChart) OR . A paper copy in the mail If you have any lab test that is abnormal or we need to change your treatment, we will call you to review the results.  Testing/Procedures: None ordered.  Follow-Up: At [facility], you and your health needs are our priority.  As part of our continuing mission to provide you with exceptional heart care, we have created designated Provider Care Teams.  These Care Teams include your primary Cardiologist (physician) and Advanced Practice Providers (APPs -  Physician Assistants and Nurse Practitioners) who all work together to provide you with the care you need, when you need it.  We recommend signing up for the patient portal called "MyChart".  Sign up information is provided on this After Visit Summary.  MyChart is used to connect with patients for Virtual Visits (Telemedicine).  Patients are able to view lab/test results, encounter notes, upcoming appointments, etc.  Non-urgent messages can be sent to your provider as well.   To learn more about what you can do with MyChart, go to [email or web address].    Your next appointment:   Your physician wants you to follow-up in: as needed with Dr. Quentin Ore.

## 2020-06-06 NOTE — Progress Notes (Signed)
Electrophysiology Office Note:    Date:  06/06/2020   ID:  Eric [name], DOB 25-Apr-1946, MRN [id number]  PCP:  Beacher May, MD  [facility] Cardiologist:  No primary care provider on file.  [facility] Electrophysiologist:  None   Referring MD: Beacher May, MD   Chief Complaint: Bradycardia  History of Present Illness:    Eric [name] is a 74 y.o. male who presents for an evaluation of bradycardia at the request of Dr. Koleen Nimrod. Their medical history includes right bundle branch block, left anterior fascicular block, first-degree AV delay, hypertension, hepatitis C, stroke, anemia.  Patient recently presented to the emergency department after he was called by his primary care's office due to abnormal Holter monitor results.  The patient denies any presyncope, syncope, lightheadedness, dizziness.  He feels fine at baseline and is able to do all of the things he needs to do around the house.  He is here with his family who confirm him to be asymptomatic.  Past Medical History:  Diagnosis Date  . Abnormal EKG 01/29/2015   RBBB and left fascicular block  . Anemia 06/04/2010   Qualifier: Diagnosis of  By: Oneida Alar MD, Sandi L   . Arthritis   . Cerebral artery occlusion with cerebral infarction ([place]) 07/15/2011  . Constipation   . Diastolic dysfunction 0/25/4270   Grade 1. EF 65-70%  . Hepatitis C antibody test positive 02/01/2015  . HTN (hypertension)   . Leucopenia 01/30/2015  . Stiffness of joints, not elsewhere classified, multiple sites 11/04/2013  . Stroke ([place])   . Thrombocytopenia ([place]) 01/30/2015    Past Surgical History:  Procedure Laterality Date  . BIOPSY  04/16/2019   Procedure: BIOPSY;  Surgeon: Danie Binder, MD;  [facility];  Service: Endoscopy;;  . COLONOSCOPY  07/2010   FOR ANEMIA-DIVERTICULOSIS  . COLONOSCOPY N/A 04/16/2019    rectal ulcer (due to fecal impaction) and polyps ( 2 serrated and seven simple tubular adenomas). Moderate  diverticulosis in rectosigmoid, sigmoid, and descending colon.  . ESOPHAGOGASTRODUODENOSCOPY  07/2010   H PYLORI GASTRITIS  . ESOPHAGOGASTRODUODENOSCOPY (EGD) WITH PROPOFOL N/A 04/13/2019   1 benign-appearing, intrinsic moderate stenosis.  Medium sized hiatal hernia.  Mild gastritis status post biopsy. + H.pylori.  Marland Kitchen IMPACTION REMOVAL  04/14/2019   Procedure: IMPACTION REMOVAL;  Surgeon: Danie Binder, MD;  [facility];  Service: Endoscopy;;  COLON  . POLYPECTOMY  04/16/2019   Procedure: POLYPECTOMY;  Surgeon: Danie Binder, MD;  [facility];  Service: Endoscopy;;    Current Medications: Current Meds  Medication Sig  . aspirin EC 81 MG tablet Take 81 mg by mouth daily.  Marland Kitchen atorvastatin (LIPITOR) 40 MG tablet Take 1 tablet (40 mg total) by mouth at bedtime.  . cyanocobalamin 1000 MCG tablet Take 1,000 mcg by mouth daily.  Marland Kitchen docusate sodium (COLACE) 100 MG capsule Take 100 mg by mouth daily.  . feeding supplement, ENSURE ENLIVE, (ENSURE ENLIVE) LIQD Take 237 mLs by mouth 2 (two) times daily between meals. Patient likes Chocolate   . hydrochlorothiazide (HYDRODIURIL) 25 MG tablet Take 1 tablet (25 mg total) by mouth daily.  . NON FORMULARY Diet - Liquids: __X_Regular;  Diet: Regular, NAS, Consistent Carbohydrate  . pantoprazole (PROTONIX) 40 MG tablet Take 1 tablet (40 mg total) by mouth daily. 30 minutes before breakfast     Allergies:   Patient has no known allergies.   Social History   Socioeconomic History  . Marital status:  Married    Spouse name: Not on file  . Number of children: Not on file  . Years of education: Not on file  . Highest education level: Not on file  Occupational History  . Not on file  Tobacco Use  . Smoking status: Current Every Day Smoker    Packs/day: 1.00    Types: Cigarettes  . Smokeless tobacco: Never Used  Substance and Sexual Activity  . Alcohol use: No  . Drug use: No  . Sexual activity: Never  Other Topics Concern  .  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 SPENDS FREE TIME: [employer]. BIBLE STUDY. WIFE DOES SHOPPING & DRIVING. [facility].   Social Determinants of Health   Financial Resource Strain:   . Difficulty of Paying Living Expenses: Not on file  Food Insecurity:   . Worried About Charity fundraiser in the Last Year: Not on file  . Ran Out of Food in the Last Year: Not on file  Transportation Needs:   . Lack of Transportation (Medical): Not on file  . Lack of Transportation (Non-Medical): Not on file  Physical Activity:   . Days of Exercise per Week: Not on file  . Minutes of Exercise per Session: Not on file  Stress:   . Feeling of Stress : Not on file  Social Connections:   . Frequency of Communication with Friends and Family: Not on file  . Frequency of Social Gatherings with Friends and Family: Not on file  . Attends Religious Services: Not on file  . Active Member of Clubs or Organizations: Not on file  . Attends Archivist Meetings: Not on file  . Marital Status: Not on file     Family History: The patient's family history is negative for Colon polyps and Colon cancer.  ROS:   Please see the history of present illness.    All other systems reviewed and are negative.  EKGs/Labs/Other Studies Reviewed:    The following studies were reviewed today: [facility], Holter results  ZIO results personally reviewed Heart rate 26-1 67, average 56 First-degree AV block IVCD Four episodes of VT: Longest 16 seconds with average heart rate 124 Seven episodes of SVT, longest seven beats 2-1 AV block noted multiple times during the monitoring period no clear evidence of complete heart block.     EKG:  The ekg ordered today demonstrates right bundle branch block, first-degree AV delay, left anterior fascicular block  Recent Labs: 05/27/2020: BUN 17; Creatinine, Ser 1.34; Hemoglobin 12.7;  Platelets 198; Potassium 4.0; Sodium 139  Recent Lipid Panel    Component Value Date/Time   CHOL  01/05/2010 0518    169        ATP III CLASSIFICATION:  <200     mg/dL   Desirable  200-239  mg/dL   Borderline High  >=240    mg/dL   High          TRIG 80 01/05/2010 0518   HDL 40 01/05/2010 0518   CHOLHDL 4.2 01/05/2010 0518   VLDL 16 01/05/2010 0518   LDLCALC (H) 01/05/2010 0518    113        Total Cholesterol/HDL:CHD Risk Coronary Heart Disease Risk Table                     Men   Women  1/2 Average Risk   3.4  3.3  Average Risk       5.0   4.4  2 X Average Risk   9.6   7.1  3 X Average Risk  23.4   11.0        Use the calculated Patient Ratio above and the CHD Risk Table to determine the patient's CHD Risk.        ATP III CLASSIFICATION (LDL):  <100     mg/dL   Optimal  100-129  mg/dL   Near or Above                    Optimal  130-159  mg/dL   Borderline  160-189  mg/dL   High  >190     mg/dL   Very High    Physical Exam:    VS:  BP 132/72   Pulse 86   Ht 5\' 9"  (1.753 m)   Wt 181 lb (82.1 kg)   SpO2 99%   BMI 26.73 kg/m     Wt Readings from Last 3 Encounters:  06/06/20 181 lb (82.1 kg)  10/12/19 169 lb 6.4 oz (76.8 kg)  05/05/19 142 lb 6.4 oz (64.6 kg)     GEN: Well nourished, well developed in no acute distress.  Frail HEENT: Normal NECK: No JVD; No carotid bruits LYMPHATICS: No lymphadenopathy CARDIAC: RRR, no murmurs, rubs, gallops RESPIRATORY:  Clear to auscultation without rales, wheezing or rhonchi  ABDOMEN: Soft, non-tender, non-distended MUSCULOSKELETAL:  No edema; No deformity  SKIN: Warm and dry NEUROLOGIC:  Alert and oriented x 3 PSYCHIATRIC:  Normal affect   ASSESSMENT:    1. NSVT (nonsustained ventricular tachycardia) ([place])   2. Bradycardia    PLAN:    In order of problems listed above:  1. Bradycardia Patient with asymptomatic 2-1 AV block noted on recent ZIO monitor.  Patient confirms multiple times during today's visit he  is asymptomatic without syncope or presyncope.  Both the patient and his family are not very interested in permanent pacemaker and I do not think this is the best option at this point given I am not confident that it would improve his quality of life and is not without risk.  Discussed warning signs for more severe AV conduction disease including presyncope, dizziness, reduced exercise capacity.  I have informed him that he should seek care if he experiences any of the symptoms as it may be a sign of worsening AV conduction.   2. NSVT Nonsustained, asymptomatic ventricular tachycardia noted on ZIO.      Medication Adjustments/Labs and Tests Ordered: Current medicines are reviewed at length with the patient today.  Concerns regarding medicines are outlined above.  Orders Placed This Encounter  Procedures  . EKG 12-Lead   No orders of the defined types were placed in this encounter.    Signed, Lars Mage, MD, [facility]  06/06/2020 2:28 PM    Electrophysiology [place]

## 2020-11-02 DIAGNOSIS — I739 Peripheral vascular disease, unspecified: Secondary | ICD-10-CM | POA: Diagnosis not present

## 2020-11-02 DIAGNOSIS — M79674 Pain in right toe(s): Secondary | ICD-10-CM | POA: Diagnosis not present

## 2020-11-02 DIAGNOSIS — M79672 Pain in left foot: Secondary | ICD-10-CM | POA: Diagnosis not present

## 2020-11-02 DIAGNOSIS — M79675 Pain in left toe(s): Secondary | ICD-10-CM | POA: Diagnosis not present

## 2020-11-02 DIAGNOSIS — L110 Acquired keratosis follicularis: Secondary | ICD-10-CM | POA: Diagnosis not present

## 2020-11-02 DIAGNOSIS — M79671 Pain in right foot: Secondary | ICD-10-CM | POA: Diagnosis not present

## 2021-04-13 DIAGNOSIS — M79672 Pain in left foot: Secondary | ICD-10-CM | POA: Diagnosis not present

## 2021-04-13 DIAGNOSIS — I739 Peripheral vascular disease, unspecified: Secondary | ICD-10-CM | POA: Diagnosis not present

## 2021-04-13 DIAGNOSIS — M79674 Pain in right toe(s): Secondary | ICD-10-CM | POA: Diagnosis not present

## 2021-04-13 DIAGNOSIS — M79675 Pain in left toe(s): Secondary | ICD-10-CM | POA: Diagnosis not present

## 2021-04-13 DIAGNOSIS — M79671 Pain in right foot: Secondary | ICD-10-CM | POA: Diagnosis not present

## 2021-04-13 DIAGNOSIS — L110 Acquired keratosis follicularis: Secondary | ICD-10-CM | POA: Diagnosis not present

## 2021-07-13 IMAGING — CR DG ABDOMEN 1V
1 series · 1 of 1 positions shown · non-contrast
Comparison: Chest and two views abdomen 08/24/2009.

CLINICAL DATA: Abdominal distension.

EXAM:
ABDOMEN - 1 VIEW

[supine ap]
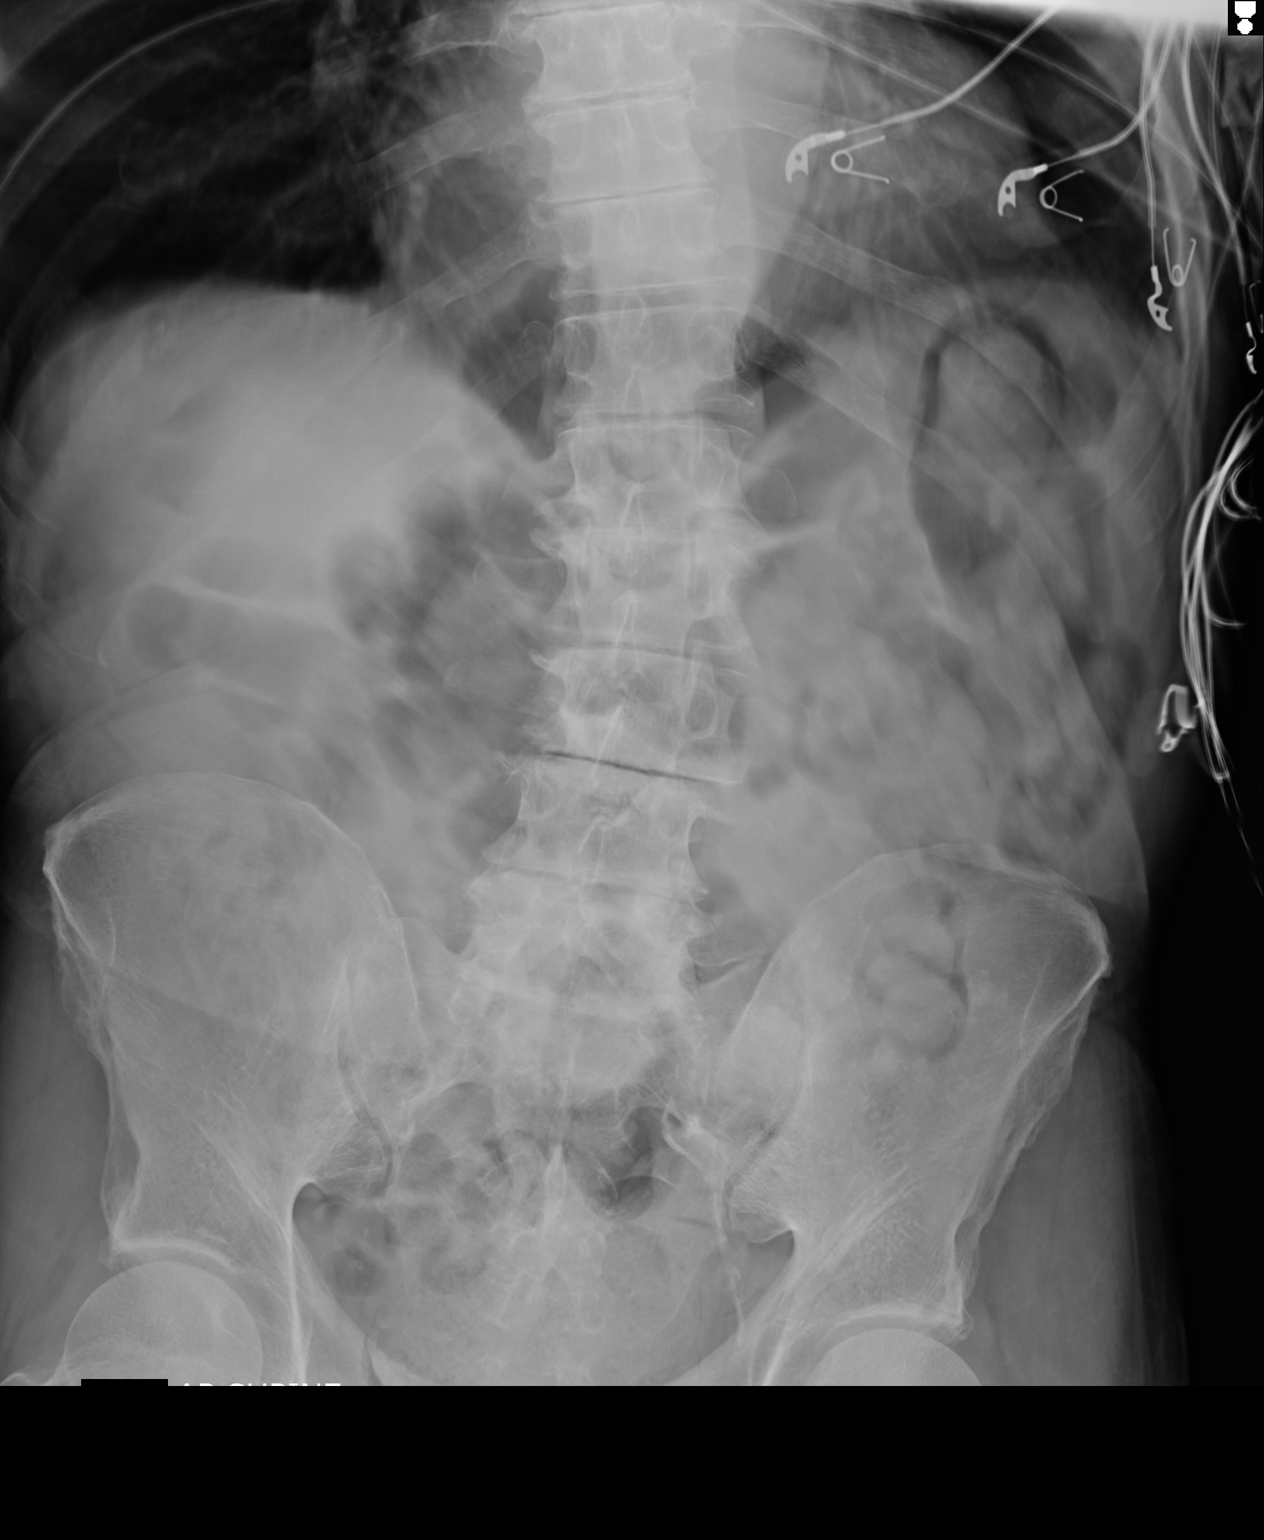

[1 of 1 positions shown; findings below may reference images not displayed]

FINDINGS: The bowel gas pattern is normal. No radio-opaque calculi or other
significant radiographic abnormality are seen. Multilevel lumbar
spondylosis is identified. Convex left thoracolumbar curvature.
IMPRESSION: No acute finding.

## 2021-08-23 DIAGNOSIS — M79674 Pain in right toe(s): Secondary | ICD-10-CM | POA: Diagnosis not present

## 2021-08-23 DIAGNOSIS — M79671 Pain in right foot: Secondary | ICD-10-CM | POA: Diagnosis not present

## 2021-08-23 DIAGNOSIS — I739 Peripheral vascular disease, unspecified: Secondary | ICD-10-CM | POA: Diagnosis not present

## 2021-08-23 DIAGNOSIS — M79672 Pain in left foot: Secondary | ICD-10-CM | POA: Diagnosis not present

## 2021-08-23 DIAGNOSIS — M79675 Pain in left toe(s): Secondary | ICD-10-CM | POA: Diagnosis not present

## 2021-08-23 DIAGNOSIS — L110 Acquired keratosis follicularis: Secondary | ICD-10-CM | POA: Diagnosis not present

## 2022-05-06 DIAGNOSIS — M79672 Pain in left foot: Secondary | ICD-10-CM | POA: Diagnosis not present

## 2022-05-06 DIAGNOSIS — M79671 Pain in right foot: Secondary | ICD-10-CM | POA: Diagnosis not present

## 2022-05-06 DIAGNOSIS — I739 Peripheral vascular disease, unspecified: Secondary | ICD-10-CM | POA: Diagnosis not present

## 2022-05-06 DIAGNOSIS — L110 Acquired keratosis follicularis: Secondary | ICD-10-CM | POA: Diagnosis not present

## 2022-05-06 DIAGNOSIS — M79675 Pain in left toe(s): Secondary | ICD-10-CM | POA: Diagnosis not present

## 2022-05-06 DIAGNOSIS — M79674 Pain in right toe(s): Secondary | ICD-10-CM | POA: Diagnosis not present

## 2022-12-19 DIAGNOSIS — M79675 Pain in left toe(s): Secondary | ICD-10-CM | POA: Diagnosis not present

## 2022-12-19 DIAGNOSIS — M79674 Pain in right toe(s): Secondary | ICD-10-CM | POA: Diagnosis not present

## 2022-12-19 DIAGNOSIS — I739 Peripheral vascular disease, unspecified: Secondary | ICD-10-CM | POA: Diagnosis not present

## 2022-12-19 DIAGNOSIS — M79672 Pain in left foot: Secondary | ICD-10-CM | POA: Diagnosis not present

## 2022-12-19 DIAGNOSIS — L110 Acquired keratosis follicularis: Secondary | ICD-10-CM | POA: Diagnosis not present

## 2022-12-19 DIAGNOSIS — M79671 Pain in right foot: Secondary | ICD-10-CM | POA: Diagnosis not present

## 2023-03-27 DIAGNOSIS — M79671 Pain in right foot: Secondary | ICD-10-CM | POA: Diagnosis not present

## 2023-03-27 DIAGNOSIS — L110 Acquired keratosis follicularis: Secondary | ICD-10-CM | POA: Diagnosis not present

## 2023-03-27 DIAGNOSIS — I739 Peripheral vascular disease, unspecified: Secondary | ICD-10-CM | POA: Diagnosis not present

## 2023-03-27 DIAGNOSIS — M79674 Pain in right toe(s): Secondary | ICD-10-CM | POA: Diagnosis not present

## 2023-03-27 DIAGNOSIS — M79672 Pain in left foot: Secondary | ICD-10-CM | POA: Diagnosis not present

## 2023-03-27 DIAGNOSIS — M79675 Pain in left toe(s): Secondary | ICD-10-CM | POA: Diagnosis not present

## 2024-05-13 ENCOUNTER — Emergency Department (HOSPITAL_COMMUNITY)

## 2024-05-13 ENCOUNTER — Encounter (HOSPITAL_COMMUNITY): Payer: Self-pay | Admitting: Emergency Medicine

## 2024-05-13 ENCOUNTER — Observation Stay (HOSPITAL_COMMUNITY)

## 2024-05-13 ENCOUNTER — Inpatient Hospital Stay (HOSPITAL_COMMUNITY)
Admission: EM | Admit: 2024-05-13 | Discharge: 2024-05-20 | DRG: 243 | Disposition: A | Discharge status: Skilled Nursing Facility | Attending: Internal Medicine | Admitting: Internal Medicine

## 2024-05-13 ENCOUNTER — Other Ambulatory Visit: Payer: Self-pay

## 2024-05-13 DIAGNOSIS — Z515 Encounter for palliative care: Secondary | ICD-10-CM

## 2024-05-13 DIAGNOSIS — E876 Hypokalemia: Secondary | ICD-10-CM | POA: Diagnosis present

## 2024-05-13 DIAGNOSIS — I351 Nonrheumatic aortic (valve) insufficiency: Secondary | ICD-10-CM | POA: Diagnosis present

## 2024-05-13 DIAGNOSIS — I459 Conduction disorder, unspecified: Secondary | ICD-10-CM

## 2024-05-13 DIAGNOSIS — R001 Bradycardia, unspecified: Secondary | ICD-10-CM

## 2024-05-13 DIAGNOSIS — R296 Repeated falls: Secondary | ICD-10-CM | POA: Diagnosis present

## 2024-05-13 DIAGNOSIS — I129 Hypertensive chronic kidney disease with stage 1 through stage 4 chronic kidney disease, or unspecified chronic kidney disease: Secondary | ICD-10-CM | POA: Diagnosis present

## 2024-05-13 DIAGNOSIS — E785 Hyperlipidemia, unspecified: Secondary | ICD-10-CM | POA: Diagnosis present

## 2024-05-13 DIAGNOSIS — F0390 Unspecified dementia without behavioral disturbance: Secondary | ICD-10-CM | POA: Diagnosis present

## 2024-05-13 DIAGNOSIS — I6930 Unspecified sequelae of cerebral infarction: Secondary | ICD-10-CM

## 2024-05-13 DIAGNOSIS — I440 Atrioventricular block, first degree: Secondary | ICD-10-CM | POA: Diagnosis not present

## 2024-05-13 DIAGNOSIS — I10 Essential (primary) hypertension: Secondary | ICD-10-CM | POA: Diagnosis not present

## 2024-05-13 DIAGNOSIS — I4510 Unspecified right bundle-branch block: Secondary | ICD-10-CM | POA: Diagnosis not present

## 2024-05-13 DIAGNOSIS — I639 Cerebral infarction, unspecified: Secondary | ICD-10-CM | POA: Diagnosis not present

## 2024-05-13 DIAGNOSIS — I442 Atrioventricular block, complete: Principal | ICD-10-CM | POA: Diagnosis present

## 2024-05-13 DIAGNOSIS — Z23 Encounter for immunization: Secondary | ICD-10-CM

## 2024-05-13 DIAGNOSIS — I69351 Hemiplegia and hemiparesis following cerebral infarction affecting right dominant side: Secondary | ICD-10-CM

## 2024-05-13 DIAGNOSIS — R262 Difficulty in walking, not elsewhere classified: Secondary | ICD-10-CM

## 2024-05-13 DIAGNOSIS — I700 Atherosclerosis of aorta: Secondary | ICD-10-CM | POA: Diagnosis present

## 2024-05-13 DIAGNOSIS — R531 Weakness: Secondary | ICD-10-CM | POA: Diagnosis present

## 2024-05-13 DIAGNOSIS — Z66 Do not resuscitate: Secondary | ICD-10-CM | POA: Diagnosis present

## 2024-05-13 DIAGNOSIS — M4804 Spinal stenosis, thoracic region: Secondary | ICD-10-CM | POA: Diagnosis present

## 2024-05-13 DIAGNOSIS — R7303 Prediabetes: Secondary | ICD-10-CM | POA: Diagnosis present

## 2024-05-13 DIAGNOSIS — Z8619 Personal history of other infectious and parasitic diseases: Secondary | ICD-10-CM

## 2024-05-13 DIAGNOSIS — Z79899 Other long term (current) drug therapy: Secondary | ICD-10-CM

## 2024-05-13 DIAGNOSIS — W19XXXA Unspecified fall, initial encounter: Secondary | ICD-10-CM | POA: Diagnosis not present

## 2024-05-13 DIAGNOSIS — Z7982 Long term (current) use of aspirin: Secondary | ICD-10-CM

## 2024-05-13 DIAGNOSIS — R5381 Other malaise: Secondary | ICD-10-CM | POA: Diagnosis present

## 2024-05-13 DIAGNOSIS — F17210 Nicotine dependence, cigarettes, uncomplicated: Secondary | ICD-10-CM | POA: Diagnosis present

## 2024-05-13 DIAGNOSIS — N182 Chronic kidney disease, stage 2 (mild): Secondary | ICD-10-CM | POA: Diagnosis present

## 2024-05-13 DIAGNOSIS — I6523 Occlusion and stenosis of bilateral carotid arteries: Secondary | ICD-10-CM | POA: Diagnosis present

## 2024-05-13 DIAGNOSIS — Z9181 History of falling: Secondary | ICD-10-CM

## 2024-05-13 DIAGNOSIS — I452 Bifascicular block: Secondary | ICD-10-CM | POA: Diagnosis present

## 2024-05-13 DIAGNOSIS — G40909 Epilepsy, unspecified, not intractable, without status epilepticus: Secondary | ICD-10-CM | POA: Diagnosis present

## 2024-05-13 DIAGNOSIS — I6502 Occlusion and stenosis of left vertebral artery: Secondary | ICD-10-CM | POA: Diagnosis present

## 2024-05-13 LAB — COMPREHENSIVE METABOLIC PANEL WITH GFR
ALT: 12 U/L (ref 0–44)
AST: 54 U/L — ABNORMAL HIGH (ref 15–41)
Albumin: 4 g/dL (ref 3.5–5.0)
Alkaline Phosphatase: 67 U/L (ref 38–126)
Anion gap: 14 (ref 5–15)
BUN: 16 mg/dL (ref 8–23)
CO2: 26 mmol/L (ref 22–32)
Calcium: 10 mg/dL (ref 8.9–10.3)
Chloride: 102 mmol/L (ref 98–111)
Creatinine, Ser: 1.31 mg/dL — ABNORMAL HIGH (ref 0.61–1.24)
GFR, Estimated: 56 mL/min — ABNORMAL LOW (ref >60)
Glucose, Bld: 115 mg/dL — ABNORMAL HIGH (ref 70–99)
Potassium: 3.8 mmol/L (ref 3.5–5.1)
Sodium: 141 mmol/L (ref 135–145)
Total Bilirubin: 1.2 mg/dL (ref 0.0–1.2)
Total Protein: 8.7 g/dL — ABNORMAL HIGH (ref 6.5–8.1)

## 2024-05-13 LAB — CBC WITH DIFFERENTIAL/PLATELET
Abs Immature Granulocytes: 0.01 K/uL (ref 0.00–0.07)
Basophils Absolute: 0 K/uL (ref 0.0–0.1)
Basophils Relative: 0 %
Eosinophils Absolute: 0.1 K/uL (ref 0.0–0.5)
Eosinophils Relative: 2 %
HCT: 47 % (ref 39.0–52.0)
Hemoglobin: 15.1 g/dL (ref 13.0–17.0)
Immature Granulocytes: 0 %
Lymphocytes Relative: 13 %
Lymphs Abs: 1 K/uL (ref 0.7–4.0)
MCH: 30.8 pg (ref 26.0–34.0)
MCHC: 32.1 g/dL (ref 30.0–36.0)
MCV: 95.9 fL (ref 80.0–100.0)
Monocytes Absolute: 0.9 K/uL (ref 0.1–1.0)
Monocytes Relative: 13 %
Neutro Abs: 5.3 K/uL (ref 1.7–7.7)
Neutrophils Relative %: 72 %
Platelets: 148 K/uL — ABNORMAL LOW (ref 150–400)
RBC: 4.9 MIL/uL (ref 4.22–5.81)
RDW: 14.5 % (ref 11.5–15.5)
WBC: 7.3 K/uL (ref 4.0–10.5)
nRBC: 0 % (ref 0.0–0.2)

## 2024-05-13 LAB — ECHOCARDIOGRAM COMPLETE
AR max vel: 1.89 cm2
AV Area VTI: 1.75 cm2
AV Area mean vel: 1.89 cm2
AV Mean grad: 15 mmHg
AV Peak grad: 29.4 mmHg
Ao pk vel: 2.71 m/s
Area-P 1/2: 3.26 cm2
Height: 69 in
S' Lateral: 3.5 cm
Weight: 2892.44 [oz_av]

## 2024-05-13 LAB — URINALYSIS, ROUTINE W REFLEX MICROSCOPIC
Bacteria, UA: NONE SEEN
Bilirubin Urine: NEGATIVE
Glucose, UA: NEGATIVE mg/dL
Hgb urine dipstick: NEGATIVE
Ketones, ur: NEGATIVE mg/dL
Leukocytes,Ua: NEGATIVE
Nitrite: NEGATIVE
Protein, ur: 100 mg/dL — AB
Specific Gravity, Urine: 1.017 (ref 1.005–1.030)
pH: 5 (ref 5.0–8.0)

## 2024-05-13 LAB — MRSA NEXT GEN BY PCR, NASAL: MRSA by PCR Next Gen: NOT DETECTED

## 2024-05-13 LAB — HEMOGLOBIN A1C
Hgb A1c MFr Bld: 5.8 % — ABNORMAL HIGH (ref 4.8–5.6)
Mean Plasma Glucose: 119.76 mg/dL

## 2024-05-13 LAB — CBG MONITORING, ED: Glucose-Capillary: 96 mg/dL (ref 70–99)

## 2024-05-13 MED ORDER — SODIUM CHLORIDE 0.9 % IV SOLN: INTRAVENOUS | Status: AC

## 2024-05-13 MED ORDER — CLOPIDOGREL BISULFATE 75 MG PO TABS
300.0000 mg | ORAL_TABLET | Freq: Once | ORAL | Status: AC
  Administered 2024-05-13: 300 mg via ORAL
  Filled 2024-05-13: qty 4

## 2024-05-13 MED ORDER — ENSURE ENLIVE PO LIQD
237.0000 mL | Freq: Two times a day (BID) | ORAL | Status: DC
  Administered 2024-05-14 – 2024-05-17 (×5): 237 mL via ORAL
  Filled 2024-05-13 (×15): qty 237

## 2024-05-13 MED ORDER — CLOPIDOGREL BISULFATE 75 MG PO TABS: 75.0000 mg | ORAL_TABLET | Freq: Every day | ORAL | Status: DC

## 2024-05-13 MED ORDER — ACETAMINOPHEN 650 MG RE SUPP: 650.0000 mg | RECTAL | Status: DC | PRN

## 2024-05-13 MED ORDER — SODIUM CHLORIDE 0.9 % IV BOLUS
500.0000 mL | Freq: Once | INTRAVENOUS | Status: AC
  Administered 2024-05-13: 500 mL via INTRAVENOUS

## 2024-05-13 MED ORDER — ENOXAPARIN SODIUM 40 MG/0.4ML IJ SOSY
40.0000 mg | PREFILLED_SYRINGE | INTRAMUSCULAR | Status: DC
  Administered 2024-05-13 – 2024-05-17 (×5): 40 mg via SUBCUTANEOUS
  Filled 2024-05-13 (×5): qty 0.4

## 2024-05-13 MED ORDER — ASPIRIN 81 MG PO TBEC
81.0000 mg | DELAYED_RELEASE_TABLET | Freq: Every day | ORAL | Status: DC
  Administered 2024-05-14 – 2024-05-20 (×7): 81 mg via ORAL
  Filled 2024-05-13 (×7): qty 1

## 2024-05-13 MED ORDER — ACETAMINOPHEN 160 MG/5ML PO SOLN: 650.0000 mg | ORAL | Status: DC | PRN

## 2024-05-13 MED ORDER — PANTOPRAZOLE SODIUM 40 MG PO TBEC
40.0000 mg | DELAYED_RELEASE_TABLET | Freq: Every day | ORAL | Status: DC
  Administered 2024-05-14 – 2024-05-20 (×7): 40 mg via ORAL
  Filled 2024-05-13 (×7): qty 1

## 2024-05-13 MED ORDER — STROKE: EARLY STAGES OF RECOVERY BOOK
Freq: Once | Status: AC
  Filled 2024-05-13: qty 1

## 2024-05-13 MED ORDER — DOCUSATE SODIUM 100 MG PO CAPS
100.0000 mg | ORAL_CAPSULE | Freq: Every day | ORAL | Status: DC
  Administered 2024-05-14 – 2024-05-20 (×7): 100 mg via ORAL
  Filled 2024-05-13 (×7): qty 1

## 2024-05-13 MED ORDER — SENNOSIDES-DOCUSATE SODIUM 8.6-50 MG PO TABS: 1.0000 | ORAL_TABLET | Freq: Every evening | ORAL | Status: DC | PRN

## 2024-05-13 MED ORDER — IOHEXOL 350 MG/ML SOLN
80.0000 mL | Freq: Once | INTRAVENOUS | Status: AC | PRN
  Administered 2024-05-13: 75 mL via INTRAVENOUS

## 2024-05-13 MED ORDER — FERROUS SULFATE 325 (65 FE) MG PO TABS
325.0000 mg | ORAL_TABLET | Freq: Every day | ORAL | Status: DC
  Administered 2024-05-14 – 2024-05-20 (×7): 325 mg via ORAL
  Filled 2024-05-13 (×7): qty 1

## 2024-05-13 MED ORDER — ATORVASTATIN CALCIUM 40 MG PO TABS
40.0000 mg | ORAL_TABLET | Freq: Every day | ORAL | Status: DC
  Administered 2024-05-13 – 2024-05-19 (×7): 40 mg via ORAL
  Filled 2024-05-13 (×7): qty 1

## 2024-05-13 MED ORDER — ACETAMINOPHEN 325 MG PO TABS
650.0000 mg | ORAL_TABLET | ORAL | Status: DC | PRN
  Administered 2024-05-15: 650 mg via ORAL
  Filled 2024-05-13 (×2): qty 2

## 2024-05-13 MED ORDER — VITAMIN B-12 1000 MCG PO TABS
1000.0000 ug | ORAL_TABLET | Freq: Every day | ORAL | Status: DC
Start: 2024-05-14 — End: 2024-05-20
  Administered 2024-05-14 – 2024-05-20 (×7): 1000 ug via ORAL
  Filled 2024-05-13 (×7): qty 1

## 2024-05-13 MED ORDER — CHLORHEXIDINE GLUCONATE CLOTH 2 % EX PADS
6.0000 | MEDICATED_PAD | Freq: Every day | CUTANEOUS | Status: DC
  Administered 2024-05-14 – 2024-05-19 (×6): 6 via TOPICAL
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
## 2024-05-13 NOTE — Progress Notes (Signed)
  Echo results called by Cardiologist Dr. Mallipeddi, he has complete heart block, reviewed EKG. I reached out to [facility] for transfer to [facility] ICU, connected to cardiologist who agreed for transfer. Discussed with PCCM service no beds available at this time.  I talked to wife over phone regarding his current care. She mentioned he refused pacemaker when he was asked by cardiologist during zio interrogation. She wishes him to stay here at [facility]. I did mention that if his heart stops he will remain full code. She understand and wishes goals of care discussion tomorrow.  Orders to transfer him to ICU with pacer at bedside placed. He remains asymptomatic with HR >40, BP stable. No further medications needed at this time per cardiology. Will continue to monitor him closely in ICU.
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
## 2024-05-13 NOTE — Progress Notes (Signed)
   05/13/24 2005  Provider Notification  Provider Name/Title Erminio Cone NP  Date Provider Notified 05/13/24  Time Provider Notified 2015  Method of Notification Face-to-face  Notification Reason Other (Comment) (HR 42-55, 2nd degree HB vs complete HB on monitor, BP stable and pt asymptomatic, Zoll/code cart at bedside, pads currently on patient per order)  Provider response At bedside;No new orders  Date of Provider Response 05/13/24  Time of Provider Response 2015

## 2024-05-13 NOTE — Progress Notes (Addendum)
   05/13/24 1655  Vitals  Temp 97.6 F (36.4 C)  Temp Source Oral  BP (!) 139/58  MAP (mmHg) 76  BP Location Left Arm  BP Method Automatic  Patient Position (if appropriate) Lying  Pulse Rate (!) 44  MEWS COLOR  MEWS Score Color Yellow  Oxygen Therapy  SpO2 100 %  O2 Device Room Air    Dr Darci notified RN that patient is in complete heart block and will be transferring to [facility]. RN gave report to Zachary RN in ICU for transfer to [facility] and MD placed order for placement of pacer pads. Charge RN Inocente aware   At 1708: Respiratory coming to do STAT EKG   AT 1740 patient transferred to ICU bed 5, and wife named Elveria made aware via telephone of transfer.   Assessment/Admission questions not completed as patient transferred asap after arrival to floor from ER.

## 2024-05-14 ENCOUNTER — Inpatient Hospital Stay (HOSPITAL_COMMUNITY)

## 2024-05-14 ENCOUNTER — Observation Stay (HOSPITAL_COMMUNITY)

## 2024-05-14 DIAGNOSIS — I442 Atrioventricular block, complete: Secondary | ICD-10-CM | POA: Insufficient documentation

## 2024-05-14 DIAGNOSIS — Z48812 Encounter for surgical aftercare following surgery on the circulatory system: Secondary | ICD-10-CM | POA: Diagnosis not present

## 2024-05-14 DIAGNOSIS — I351 Nonrheumatic aortic (valve) insufficiency: Secondary | ICD-10-CM | POA: Diagnosis present

## 2024-05-14 DIAGNOSIS — I459 Conduction disorder, unspecified: Secondary | ICD-10-CM | POA: Diagnosis not present

## 2024-05-14 DIAGNOSIS — Z515 Encounter for palliative care: Secondary | ICD-10-CM | POA: Diagnosis not present

## 2024-05-14 DIAGNOSIS — M5410 Radiculopathy, site unspecified: Secondary | ICD-10-CM | POA: Diagnosis not present

## 2024-05-14 DIAGNOSIS — I639 Cerebral infarction, unspecified: Secondary | ICD-10-CM | POA: Diagnosis not present

## 2024-05-14 DIAGNOSIS — I951 Orthostatic hypotension: Secondary | ICD-10-CM | POA: Diagnosis not present

## 2024-05-14 DIAGNOSIS — I10 Essential (primary) hypertension: Secondary | ICD-10-CM | POA: Diagnosis not present

## 2024-05-14 DIAGNOSIS — Z7189 Other specified counseling: Secondary | ICD-10-CM | POA: Diagnosis not present

## 2024-05-14 DIAGNOSIS — I452 Bifascicular block: Secondary | ICD-10-CM | POA: Diagnosis present

## 2024-05-14 DIAGNOSIS — M6281 Muscle weakness (generalized): Secondary | ICD-10-CM | POA: Diagnosis not present

## 2024-05-14 DIAGNOSIS — M4804 Spinal stenosis, thoracic region: Secondary | ICD-10-CM | POA: Diagnosis present

## 2024-05-14 DIAGNOSIS — F17210 Nicotine dependence, cigarettes, uncomplicated: Secondary | ICD-10-CM | POA: Diagnosis present

## 2024-05-14 DIAGNOSIS — Z7982 Long term (current) use of aspirin: Secondary | ICD-10-CM | POA: Diagnosis not present

## 2024-05-14 DIAGNOSIS — I669 Occlusion and stenosis of unspecified cerebral artery: Secondary | ICD-10-CM | POA: Diagnosis not present

## 2024-05-14 DIAGNOSIS — Z7401 Bed confinement status: Secondary | ICD-10-CM | POA: Diagnosis not present

## 2024-05-14 DIAGNOSIS — R404 Transient alteration of awareness: Secondary | ICD-10-CM | POA: Diagnosis not present

## 2024-05-14 DIAGNOSIS — I110 Hypertensive heart disease with heart failure: Secondary | ICD-10-CM | POA: Diagnosis not present

## 2024-05-14 DIAGNOSIS — I69341 Monoplegia of lower limb following cerebral infarction affecting right dominant side: Secondary | ICD-10-CM | POA: Diagnosis not present

## 2024-05-14 DIAGNOSIS — Z66 Do not resuscitate: Secondary | ICD-10-CM | POA: Diagnosis present

## 2024-05-14 DIAGNOSIS — R296 Repeated falls: Secondary | ICD-10-CM | POA: Diagnosis present

## 2024-05-14 DIAGNOSIS — Z79899 Other long term (current) drug therapy: Secondary | ICD-10-CM | POA: Diagnosis not present

## 2024-05-14 DIAGNOSIS — I69351 Hemiplegia and hemiparesis following cerebral infarction affecting right dominant side: Secondary | ICD-10-CM | POA: Diagnosis not present

## 2024-05-14 DIAGNOSIS — R7303 Prediabetes: Secondary | ICD-10-CM | POA: Diagnosis present

## 2024-05-14 DIAGNOSIS — I6930 Unspecified sequelae of cerebral infarction: Secondary | ICD-10-CM | POA: Diagnosis not present

## 2024-05-14 DIAGNOSIS — R5381 Other malaise: Secondary | ICD-10-CM

## 2024-05-14 DIAGNOSIS — I129 Hypertensive chronic kidney disease with stage 1 through stage 4 chronic kidney disease, or unspecified chronic kidney disease: Secondary | ICD-10-CM | POA: Diagnosis present

## 2024-05-14 DIAGNOSIS — F0390 Unspecified dementia without behavioral disturbance: Secondary | ICD-10-CM | POA: Diagnosis present

## 2024-05-14 DIAGNOSIS — I6502 Occlusion and stenosis of left vertebral artery: Secondary | ICD-10-CM | POA: Diagnosis present

## 2024-05-14 DIAGNOSIS — I5032 Chronic diastolic (congestive) heart failure: Secondary | ICD-10-CM | POA: Diagnosis not present

## 2024-05-14 DIAGNOSIS — G40909 Epilepsy, unspecified, not intractable, without status epilepticus: Secondary | ICD-10-CM | POA: Diagnosis present

## 2024-05-14 DIAGNOSIS — E785 Hyperlipidemia, unspecified: Secondary | ICD-10-CM | POA: Diagnosis present

## 2024-05-14 DIAGNOSIS — I700 Atherosclerosis of aorta: Secondary | ICD-10-CM | POA: Diagnosis present

## 2024-05-14 DIAGNOSIS — I6523 Occlusion and stenosis of bilateral carotid arteries: Secondary | ICD-10-CM | POA: Diagnosis present

## 2024-05-14 DIAGNOSIS — R001 Bradycardia, unspecified: Secondary | ICD-10-CM | POA: Diagnosis present

## 2024-05-14 DIAGNOSIS — Z8619 Personal history of other infectious and parasitic diseases: Secondary | ICD-10-CM | POA: Diagnosis not present

## 2024-05-14 DIAGNOSIS — Z789 Other specified health status: Secondary | ICD-10-CM

## 2024-05-14 DIAGNOSIS — E876 Hypokalemia: Secondary | ICD-10-CM | POA: Diagnosis present

## 2024-05-14 DIAGNOSIS — Z9181 History of falling: Secondary | ICD-10-CM | POA: Diagnosis not present

## 2024-05-14 DIAGNOSIS — Z23 Encounter for immunization: Secondary | ICD-10-CM | POA: Diagnosis not present

## 2024-05-14 DIAGNOSIS — N182 Chronic kidney disease, stage 2 (mild): Secondary | ICD-10-CM | POA: Diagnosis present

## 2024-05-14 LAB — LIPID PANEL
Cholesterol: 139 mg/dL (ref 0–200)
HDL: 55 mg/dL (ref >40)
LDL Cholesterol: 72 mg/dL (ref 0–99)
Total CHOL/HDL Ratio: 2.5 ratio
Triglycerides: 62 mg/dL (ref <150)
VLDL: 12 mg/dL (ref 0–40)

## 2024-05-14 MED ORDER — INFLUENZA VAC SPLIT HIGH-DOSE 0.5 ML IM SUSY
0.5000 mL | PREFILLED_SYRINGE | INTRAMUSCULAR | Status: AC | PRN
  Administered 2024-05-16: 0.5 mL via INTRAMUSCULAR
  Filled 2024-05-14: qty 0.5
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
## 2024-05-14 NOTE — Progress Notes (Signed)
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
## 2024-05-14 NOTE — TOC Initial Note (Addendum)
 Transition of Care [facility]) - Initial/Assessment Note    Patient Details  Name: Eric [name] MRN: [id number] Date of Birth: 08-May-1946  Transition of Care [facility]) CM/SW Contact:    Noreen KATHEE Cleotilde ISRAEL Phone Number: 05/14/2024, 11:50 AM  Clinical Narrative:                  CSW spoke with patient at bedside and assessed patient. Family was also at bedside. Patient agreeable to CIR. [facility] [place] [facility]. Patient currently has a walker and cane at home that he does not use. Spouse assist with all of patient needs and transportation. Spouse and patient agreeable to CSW reaching out to [facility] about admission.   Addendum 1:55 pm  [facility] Notification: [phone]  Expected Discharge Plan: IP Rehab Facility Barriers to Discharge: Continued Medical Work up   Patient Goals and CMS Choice Patient states their goals for this hospitalization and ongoing recovery are:: Cir for rehab CMS Medicare.gov Compare Post Acute Care list provided to:: Patient Represenative (must comment) (spouse) Choice offered to / list presented to : Patient, Adult Children      Expected Discharge Plan and Services In-house Referral: Clinical Social Work   Post Acute Care Choice: Durable Medical Equipment Living arrangements for the past 2 months: Single Family Home                                      Prior Living Arrangements/Services Living arrangements for the past 2 months: Single Family Home Lives with:: Spouse Patient language and need for interpreter reviewed:: Yes Do you feel safe going back to the place where you live?: Yes      Need for Family Participation in Patient Care: Yes (Comment) Care giver support system in place?: Yes (comment) Current home services: DME Criminal Activity/Legal Involvement Pertinent to Current Situation/Hospitalization: No - Comment as needed  Activities of Daily Living   ADL Screening (condition at time of  admission) Independently performs ADLs?: Yes (appropriate for developmental age) Is the patient deaf or have difficulty hearing?: No Does the patient have difficulty seeing, even when wearing glasses/contacts?: No Does the patient have difficulty concentrating, remembering, or making decisions?: Yes  Permission Sought/Granted      Share Information with NAME: Sufian and Elveria     Permission granted to share info w Relationship: Patient and spouse     Emotional Assessment Appearance:: Appears stated age Attitude/Demeanor/Rapport: Engaged Affect (typically observed): Accepting Orientation: : Oriented to Self, Oriented to Place, Oriented to  Time, Oriented to Situation Alcohol / Substance Use: Tobacco Use Psych Involvement: No (comment)  Admission diagnosis:  Bradycardia [R00.1] Acute ischemic stroke [facility]) [I63.9] Cerebrovascular accident (CVA), unspecified mechanism (HCC) [I63.9] Patient Active Problem List   Diagnosis Date Noted   Acute ischemic stroke (HCC) 05/13/2024   Bradycardia 06/06/2020   Chronic blood loss anemia 05/01/2019   History of cerebrovascular accident (CVA) with residual deficit 05/01/2019   Hemiparesis of right dominant side due to cerebrovascular disease (HCC) 04/19/2019   Dyslipidemia 04/19/2019   Chronic constipation 04/19/2019   Vitamin B12 deficiency 04/19/2019   Stercoral ulcer of rectum 04/19/2019   Gastritis, Helicobacter pylori 04/19/2019   Iron deficiency anemia    Gastrointestinal hemorrhage    Heme positive stool 04/11/2019   Orthostatic hypotension 04/11/2019   Current smoker 04/11/2019   Hepatitis C antibody test positive 02/01/2015  Diastolic dysfunction 01/30/2015   Leucopenia 01/30/2015   Thrombocytopenia 01/30/2015   Generalized weakness 01/29/2015   Diaphoresis 01/29/2015   Essential hypertension 01/29/2015   Abnormal EKG 01/29/2015   Acute kidney injury (HCC) 01/29/2015   Hyperglycemia 01/29/2015   UTI (urinary tract  infection), bacterial 01/29/2015   Difficulty walking 11/04/2013   Stiffness of joints, not elsewhere classified, multiple sites 11/04/2013   Cerebral artery occlusion with cerebral infarction (HCC) 07/15/2011   Lack of coordination 07/15/2011   Symptomatic anemia 06/04/2010   PCP:  Charlott Harvey, MD Pharmacy:   [facility] [place], [place] - [address] [place] [address] Phone: [phone] Fax: [phone]  [facility] [address] [place] - [address] [place] [address] [place] [address] [place] [address] Phone: [phone] Fax: [phone]  [facility] [place], [place] - [address] [place] [address] Phone: [phone] Fax: [phone]     Social Drivers of Health (SDOH) Social History: SDOH Screenings   Food Insecurity: No Food Insecurity (05/13/2024)  Housing: Low Risk  (05/13/2024)  Transportation Needs: No Transportation Needs (05/13/2024)  Utilities: Not At Risk (05/13/2024)  Social Connections: Unknown (05/13/2024)  Tobacco Use: High Risk (05/13/2024)   SDOH Interventions:     Readmission Risk Interventions     No data to display
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
## 2024-05-14 NOTE — Evaluation (Signed)
 Speech Language Pathology Evaluation Patient Details Name: Eric [name] MRN: [id number] DOB: Jun 15, 1946 Today's Date: 05/14/2024 Time: 1150-1210 SLP Time Calculation (min) (ACUTE ONLY): 20 min  Problem List:  Patient Active Problem List   Diagnosis Date Noted   Acute ischemic stroke (HCC) 05/13/2024   Bradycardia 06/06/2020   Chronic blood loss anemia 05/01/2019   History of cerebrovascular accident (CVA) with residual deficit 05/01/2019   Hemiparesis of right dominant side due to cerebrovascular disease (HCC) 04/19/2019   Dyslipidemia 04/19/2019   Chronic constipation 04/19/2019   Vitamin B12 deficiency 04/19/2019   Stercoral ulcer of rectum 04/19/2019   Gastritis, Helicobacter pylori 04/19/2019   Iron deficiency anemia    Gastrointestinal hemorrhage    Heme positive stool 04/11/2019   Orthostatic hypotension 04/11/2019   Current smoker 04/11/2019   Hepatitis C antibody test positive 02/01/2015   Diastolic dysfunction 01/30/2015   Leucopenia 01/30/2015   Thrombocytopenia 01/30/2015   Generalized weakness 01/29/2015   Diaphoresis 01/29/2015   Essential hypertension 01/29/2015   Abnormal EKG 01/29/2015   Acute kidney injury (HCC) 01/29/2015   Hyperglycemia 01/29/2015   UTI (urinary tract infection), bacterial 01/29/2015   Difficulty walking 11/04/2013   Stiffness of joints, not elsewhere classified, multiple sites 11/04/2013   Cerebral artery occlusion with cerebral infarction (HCC) 07/15/2011   Lack of coordination 07/15/2011   Symptomatic anemia 06/04/2010   Past Medical History:  Past Medical History:  Diagnosis Date   Abnormal EKG 01/29/2015   RBBB and left fascicular block   Anemia 06/04/2010   Qualifier: Diagnosis of  By: Harvey MD, Sandi L    Arthritis    Cerebral artery occlusion with cerebral infarction (HCC) 07/15/2011   Constipation    Diastolic dysfunction 01/30/2015   Grade 1. EF 65-70%   Hepatitis C antibody test positive 02/01/2015   HTN  (hypertension)    Leucopenia 01/30/2015   Stiffness of joints, not elsewhere classified, multiple sites 11/04/2013   Stroke (HCC)    Thrombocytopenia 01/30/2015   Past Surgical History:  Past Surgical History:  Procedure Laterality Date   BIOPSY  04/16/2019   Procedure: BIOPSY;  Surgeon: Harvey Margo CROME, MD;  [facility];  Service: Endoscopy;;   COLONOSCOPY  07/2010   FOR ANEMIA-DIVERTICULOSIS   COLONOSCOPY N/A 04/16/2019    rectal ulcer (due to fecal impaction) and polyps ( 2 serrated and seven simple tubular adenomas). Moderate diverticulosis in rectosigmoid, sigmoid, and descending colon.   ESOPHAGOGASTRODUODENOSCOPY  07/2010   H PYLORI GASTRITIS   ESOPHAGOGASTRODUODENOSCOPY (EGD) WITH PROPOFOL  N/A 04/13/2019   1 benign-appearing, intrinsic moderate stenosis.  Medium sized hiatal hernia.  Mild gastritis status post biopsy. + H.pylori.   IMPACTION REMOVAL  04/14/2019   Procedure: IMPACTION REMOVAL;  Surgeon: Harvey Margo CROME, MD;  [facility]: [facility];  Service: Endoscopy;;  COLON   POLYPECTOMY  04/16/2019   Procedure: POLYPECTOMY;  Surgeon: Harvey Margo CROME, MD;  [facility]: [facility];  Service: Endoscopy;;   HPI:  Norvil E Kappes is a 78 y.o. male with medical history significant of s right bundle branch block, left anterior fascicular block, first-degree AV delay, hypertension, hepatitis C, stroke, anemia presented to ED for evaluation of ambulatory dysfunction since Monday.  Patient is a poor historian.  As per ED, EMS patient has walking difficulty since Monday morning per wife he could not get out of bed could not walk due to right-sided weakness. Usually able to walk with cane per family, noted that he had multiple falls since 3 days  with difficulty ambulation. ST consulted for speech/language cognitive assessment.  Assessment / Plan / Recommendation Clinical Impression  Pt seen for speech/language cognitive assessment per MD request.  Pt and family present for evaluation.   Pt exhibits baseline cognitive impairment per wife's report with memory retrieval being primary deficit.  Portion of the [employer] Mental Status Examination (SLUMS) administered to pt paired with chart review and observations/informal assessment and information provided by family members regarding prior level of functioning.  Pt oriented to self, place and situation, but not time.  Pt passed [employer] swallow screen and family denotes no prior dysphagia.  He exhibited a hoarse, raspy vocal quality, but family stated this was his baseline.  He was able to retrieve information for long-term memory recall, but exhibited deficits in short-term memory tasks such as object recall after a time delay and word fluency without mod verbal (category) cues and/or repetition provided by SLP.  Pt able to follow 2 step commands accurately, but processing of information impaired throughout evaluation.  Pt is likely at baseline functioning for cognition per evaluation and family member's report, so no further ST needs recommended at this time.  ST will s/o in acute setting.    SLP Assessment  SLP Recommendation/Assessment: Patient does not need any further Speech Language Pathology Services SLP Visit Diagnosis: Cognitive communication deficit (R41.841)     Assistance Recommended at Discharge  Frequent or constant Supervision/Assistance  Functional Status Assessment Patient has had a recent decline in their functional status and demonstrates the ability to make significant improvements in function in a reasonable and predictable amount of time.  Frequency and Duration  (evaluation only)         SLP Evaluation Cognition  Overall Cognitive Status: History of cognitive impairments - at baseline Arousal/Alertness: Awake/alert Orientation Level: Oriented to person;Oriented to place;Oriented to situation;Disoriented to time Attention: Sustained Sustained Attention: Appears intact Memory: Impaired Memory  Impairment: Decreased short term memory;Decreased recall of new information;Retrieval deficit Decreased Short Term Memory: Verbal basic;Functional basic Awareness: Appears intact Problem Solving: Impaired Problem Solving Impairment: Verbal complex;Functional complex (likely at baseline) Behaviors: Perseveration Safety/Judgment: Appears intact       Comprehension  Auditory Comprehension Overall Auditory Comprehension: Impaired at baseline Conversation: Simple Interfering Components: Working Theatre manager: Not tested Reading Comprehension Reading Status: Not tested    Expression Expression Primary Mode of Expression: Verbal Verbal Expression Overall Verbal Expression: Impaired at baseline (word finding) Level of Generative/Spontaneous Verbalization: Conversation Naming: Impairment Other Naming Comments: Baseline naming deficits (word finding) Pragmatics: No impairment Interfering Components: Premorbid deficit Non-Verbal Means of Communication: Not applicable Written Expression Written Expression: Not tested   Oral / Motor  Oral Motor/Sensory Function Overall Oral Motor/Sensory Function: Within functional limits Motor Speech Overall Motor Speech: Appears within functional limits for tasks assessed Respiration: Within functional limits Phonation: Hoarse Resonance: Within functional limits Articulation: Within functional limitis Intelligibility: Intelligible Motor Planning: Within functional limits Motor Speech Errors: Not applicable Interfering Components: Premorbid status            Pat Torii Royse,M.S.,CCC-SLP 05/14/2024, 12:38 PM

## 2024-05-14 NOTE — Plan of Care (Signed)
  Problem: Acute Rehab PT Goals(only PT should resolve) Goal: Pt Will Go Supine/Side To Sit Outcome: Progressing Flowsheets (Taken 05/14/2024 1350) Pt will go Supine/Side to Sit: with minimal assist Goal: Patient Will Transfer Sit To/From Stand Outcome: Progressing Flowsheets (Taken 05/14/2024 1350) Patient will transfer sit to/from stand: with minimal assist Goal: Pt Will Transfer Bed To Chair/Chair To Bed Outcome: Progressing Flowsheets (Taken 05/14/2024 1350) Pt will Transfer Bed to Chair/Chair to Bed: with min assist Goal: Pt Will Ambulate Outcome: Progressing Flowsheets (Taken 05/14/2024 1350) Pt will Ambulate:  25 feet  with minimal assist  with moderate assist  with rolling walker   1:50 PM, 05/14/24 Lynwood Music, MPT Physical Therapist with [facility] [phone] office [phone] mobile phone
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
## 2024-05-14 NOTE — Plan of Care (Signed)
  Problem: Acute Rehab OT Goals (only OT should resolve) Goal: Pt. Will Perform Eating Flowsheets (Taken 05/14/2024 1049) Pt Will Perform Eating:  with set-up  sitting Goal: Pt. Will Perform Grooming Flowsheets (Taken 05/14/2024 1049) Pt Will Perform Grooming:  with supervision  with set-up  sitting Goal: Pt. Will Perform Upper Body Bathing Flowsheets (Taken 05/14/2024 1049) Pt Will Perform Upper Body Bathing:  with supervision  with set-up  sitting Goal: Pt. Will Perform Upper Body Dressing Flowsheets (Taken 05/14/2024 1049) Pt Will Perform Upper Body Dressing:  with set-up  with supervision  sitting Goal: Pt. Will Perform Lower Body Dressing Flowsheets (Taken 05/14/2024 1049) Pt Will Perform Lower Body Dressing:  with min assist  with adaptive equipment  sitting/lateral leans Goal: Pt. Will Transfer To Toilet Flowsheets (Taken 05/14/2024 1049) Pt Will Transfer to Toilet:  with contact guard assist  with min assist  ambulating  stand pivot transfer Goal: Pt. Will Perform Toileting-Clothing Manipulation Flowsheets (Taken 05/14/2024 1049) Pt Will Perform Toileting - Clothing Manipulation and hygiene:  with contact guard assist  sitting/lateral leans  with min assist Goal: Pt/Caregiver Will Perform Home Exercise Program Flowsheets (Taken 05/14/2024 1049) Pt/caregiver will Perform Home Exercise Program:  Increased strength  Increased ROM and coordination  Right Upper extremity  Left upper extremity  With minimal assist  Riyan Haile OT, MOT
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
## 2024-05-15 DIAGNOSIS — R001 Bradycardia, unspecified: Secondary | ICD-10-CM | POA: Diagnosis not present

## 2024-05-15 DIAGNOSIS — I459 Conduction disorder, unspecified: Secondary | ICD-10-CM | POA: Diagnosis not present

## 2024-05-15 DIAGNOSIS — I639 Cerebral infarction, unspecified: Secondary | ICD-10-CM | POA: Diagnosis not present

## 2024-05-15 DIAGNOSIS — R5381 Other malaise: Secondary | ICD-10-CM | POA: Diagnosis not present

## 2024-05-15 LAB — BASIC METABOLIC PANEL WITH GFR
Anion gap: 11 (ref 5–15)
BUN: 18 mg/dL (ref 8–23)
CO2: 23 mmol/L (ref 22–32)
Calcium: 8.9 mg/dL (ref 8.9–10.3)
Chloride: 105 mmol/L (ref 98–111)
Creatinine, Ser: 1.2 mg/dL (ref 0.61–1.24)
GFR, Estimated: >60 mL/min (ref >60)
Glucose, Bld: 93 mg/dL (ref 70–99)
Potassium: 3.3 mmol/L — ABNORMAL LOW (ref 3.5–5.1)
Sodium: 138 mmol/L (ref 135–145)

## 2024-05-15 LAB — MAGNESIUM: Magnesium: 1.8 mg/dL (ref 1.7–2.4)

## 2024-05-15 MED ORDER — POTASSIUM CHLORIDE 20 MEQ PO PACK
40.0000 meq | PACK | Freq: Every day | ORAL | Status: DC
Start: 2024-05-15 — End: 2024-05-20
  Administered 2024-05-15 – 2024-05-20 (×6): 40 meq via ORAL
  Filled 2024-05-15 (×6): qty 2

## 2024-05-15 MED ORDER — HYDROCHLOROTHIAZIDE 25 MG PO TABS
25.0000 mg | ORAL_TABLET | Freq: Every day | ORAL | Status: DC
  Administered 2024-05-15 – 2024-05-20 (×6): 25 mg via ORAL
  Filled 2024-05-15 (×6): qty 1

## 2024-05-15 MED ORDER — ORAL CARE MOUTH RINSE: 15.0000 mL | OROMUCOSAL | Status: DC | PRN

## 2024-05-15 NOTE — Plan of Care (Signed)
  Problem: Education: Goal: Knowledge of disease or condition will improve Outcome: Progressing Goal: Knowledge of secondary prevention will improve (MUST DOCUMENT ALL) Outcome: Progressing Goal: Knowledge of patient specific risk factors will improve (DELETE if not current risk factor) Outcome: Progressing   Problem: Ischemic Stroke/TIA Tissue Perfusion: Goal: Complications of ischemic stroke/TIA will be minimized Outcome: Progressing   Problem: Coping: Goal: Will verbalize positive feelings about self Outcome: Progressing Goal: Will identify appropriate support needs Outcome: Progressing   Problem: Health Behavior/Discharge Planning: Goal: Ability to manage health-related needs will improve Outcome: Progressing Goal: Goals will be collaboratively established with patient/family Outcome: Progressing   Problem: Nutrition: Goal: Risk of aspiration will decrease Outcome: Progressing Goal: Dietary intake will improve Outcome: Progressing   Problem: Health Behavior/Discharge Planning: Goal: Ability to manage health-related needs will improve Outcome: Progressing

## 2024-05-15 NOTE — Progress Notes (Signed)
 Inpatient Rehab Admissions Coordinator:   CIR following, note plan to transfer to [facility]. I will follow up with Pt. And wife once they arrive here.   Leita Kleine, MS, CCC-SLP Rehab Admissions Coordinator  [phone] (celll) [phone] (office)
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
## 2024-05-15 NOTE — Plan of Care (Signed)
 This patient remains on [facility]-CCU as of time of writing. The patinet is awaiting transfer to [facility]. Zoll at bedside. The patient remains in CHB; however, MAP has remained > 65 mmHg. NIHSS = 2 for visual deficits.   Problem: Education: Goal: Knowledge of disease or condition will improve Outcome: Progressing Goal: Knowledge of secondary prevention will improve (MUST DOCUMENT ALL) Outcome: Progressing Goal: Knowledge of patient specific risk factors will improve (DELETE if not current risk factor) Outcome: Progressing   Problem: Ischemic Stroke/TIA Tissue Perfusion: Goal: Complications of ischemic stroke/TIA will be minimized Outcome: Progressing   Problem: Coping: Goal: Will verbalize positive feelings about self Outcome: Progressing Goal: Will identify appropriate support needs Outcome: Progressing   Problem: Health Behavior/Discharge Planning: Goal: Ability to manage health-related needs will improve Outcome: Progressing Goal: Goals will be collaboratively established with patient/family Outcome: Progressing   Problem: Self-Care: Goal: Ability to participate in self-care as condition permits will improve Outcome: Progressing Goal: Verbalization of feelings and concerns over difficulty with self-care will improve Outcome: Progressing Goal: Ability to communicate needs accurately will improve Outcome: Progressing   Problem: Nutrition: Goal: Risk of aspiration will decrease Outcome: Progressing Goal: Dietary intake will improve Outcome: Progressing   Problem: Education: Goal: Knowledge of General Education information will improve Description: Including pain rating scale, medication(s)/side effects and non-pharmacologic comfort measures Outcome: Progressing   Problem: Health Behavior/Discharge Planning: Goal: Ability to manage health-related needs will improve Outcome: Progressing   Problem: Clinical Measurements: Goal: Ability to maintain clinical  measurements within normal limits will improve Outcome: Progressing Goal: Will remain free from infection Outcome: Progressing Goal: Diagnostic test results will improve Outcome: Progressing Goal: Respiratory complications will improve Outcome: Progressing Goal: Cardiovascular complication will be avoided Outcome: Progressing   Problem: Activity: Goal: Risk for activity intolerance will decrease Outcome: Progressing   Problem: Nutrition: Goal: Adequate nutrition will be maintained Outcome: Progressing   Problem: Coping: Goal: Level of anxiety will decrease Outcome: Progressing   Problem: Elimination: Goal: Will not experience complications related to bowel motility Outcome: Progressing Goal: Will not experience complications related to urinary retention Outcome: Progressing   Problem: Pain Managment: Goal: General experience of comfort will improve and/or be controlled Outcome: Progressing   Problem: Safety: Goal: Ability to remain free from injury will improve Outcome: Progressing   Problem: Skin Integrity: Goal: Risk for impaired skin integrity will decrease Outcome: Progressing

## 2024-05-15 NOTE — Plan of Care (Signed)
 This patient still remains on [facility]-CCU as of time of writing; still awaiting transfer to [facility] Zoll and pads remain at bedside per orders. The patient remains a high falls risk; prevention measures in place per protocol.   Problem: Education: Goal: Knowledge of disease or condition will improve Outcome: Not Progressing Goal: Knowledge of secondary prevention will improve (MUST DOCUMENT ALL) Outcome: Not Progressing Goal: Knowledge of patient specific risk factors will improve (DELETE if not current risk factor) Outcome: Not Progressing   Problem: Ischemic Stroke/TIA Tissue Perfusion: Goal: Complications of ischemic stroke/TIA will be minimized Outcome: Not Progressing   Problem: Coping: Goal: Will verbalize positive feelings about self Outcome: Not Progressing Goal: Will identify appropriate support needs Outcome: Not Progressing   Problem: Health Behavior/Discharge Planning: Goal: Ability to manage health-related needs will improve Outcome: Not Progressing Goal: Goals will be collaboratively established with patient/family Outcome: Not Progressing   Problem: Self-Care: Goal: Ability to participate in self-care as condition permits will improve Outcome: Not Progressing Goal: Verbalization of feelings and concerns over difficulty with self-care will improve Outcome: Not Progressing Goal: Ability to communicate needs accurately will improve Outcome: Not Progressing   Problem: Nutrition: Goal: Risk of aspiration will decrease Outcome: Not Progressing Goal: Dietary intake will improve Outcome: Not Progressing   Problem: Education: Goal: Knowledge of General Education information will improve Description: Including pain rating scale, medication(s)/side effects and non-pharmacologic comfort measures Outcome: Not Progressing   Problem: Health Behavior/Discharge Planning: Goal: Ability to manage health-related needs will improve Outcome: Not Progressing   Problem: Clinical  Measurements: Goal: Ability to maintain clinical measurements within normal limits will improve Outcome: Not Progressing Goal: Will remain free from infection Outcome: Not Progressing Goal: Diagnostic test results will improve Outcome: Not Progressing Goal: Respiratory complications will improve Outcome: Not Progressing Goal: Cardiovascular complication will be avoided Outcome: Not Progressing   Problem: Activity: Goal: Risk for activity intolerance will decrease Outcome: Not Progressing   Problem: Nutrition: Goal: Adequate nutrition will be maintained Outcome: Not Progressing   Problem: Coping: Goal: Level of anxiety will decrease Outcome: Not Progressing   Problem: Elimination: Goal: Will not experience complications related to bowel motility Outcome: Not Progressing Goal: Will not experience complications related to urinary retention Outcome: Not Progressing   Problem: Pain Managment: Goal: General experience of comfort will improve and/or be controlled Outcome: Not Progressing   Problem: Safety: Goal: Ability to remain free from injury will improve Outcome: Not Progressing   Problem: Skin Integrity: Goal: Risk for impaired skin integrity will decrease Outcome: Not Progressing

## 2024-05-16 DIAGNOSIS — I442 Atrioventricular block, complete: Secondary | ICD-10-CM | POA: Diagnosis not present

## 2024-05-16 DIAGNOSIS — I459 Conduction disorder, unspecified: Secondary | ICD-10-CM | POA: Diagnosis not present

## 2024-05-16 DIAGNOSIS — I639 Cerebral infarction, unspecified: Secondary | ICD-10-CM | POA: Diagnosis not present

## 2024-05-16 DIAGNOSIS — R5381 Other malaise: Secondary | ICD-10-CM | POA: Diagnosis not present

## 2024-05-16 DIAGNOSIS — R001 Bradycardia, unspecified: Secondary | ICD-10-CM | POA: Diagnosis not present

## 2024-05-16 LAB — BASIC METABOLIC PANEL WITH GFR
Anion gap: 10 (ref 5–15)
BUN: 17 mg/dL (ref 8–23)
CO2: 23 mmol/L (ref 22–32)
Calcium: 9 mg/dL (ref 8.9–10.3)
Chloride: 105 mmol/L (ref 98–111)
Creatinine, Ser: 1.17 mg/dL (ref 0.61–1.24)
GFR, Estimated: >60 mL/min (ref >60)
Glucose, Bld: 91 mg/dL (ref 70–99)
Potassium: 3.6 mmol/L (ref 3.5–5.1)
Sodium: 139 mmol/L (ref 135–145)
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
## 2024-05-16 NOTE — Plan of Care (Signed)
 This patient still remains on [facility]-CCU as of time of writing. Clinically, no significant changes from my two prior plan of care notes. The patient is still awaiting transfer to [facility] for pacemaker. BP remains stable. No changes in neurological exam. The patient remains in a CHB with ventricular rate 30s-60s. Zoll remains at bedside per orders.   Problem: Education: Goal: Knowledge of disease or condition will improve Outcome: Progressing Goal: Knowledge of secondary prevention will improve (MUST DOCUMENT ALL) Outcome: Progressing Goal: Knowledge of patient specific risk factors will improve (DELETE if not current risk factor) Outcome: Progressing   Problem: Ischemic Stroke/TIA Tissue Perfusion: Goal: Complications of ischemic stroke/TIA will be minimized Outcome: Progressing   Problem: Coping: Goal: Will verbalize positive feelings about self Outcome: Progressing Goal: Will identify appropriate support needs Outcome: Progressing   Problem: Health Behavior/Discharge Planning: Goal: Ability to manage health-related needs will improve Outcome: Progressing Goal: Goals will be collaboratively established with patient/family Outcome: Progressing   Problem: Self-Care: Goal: Ability to participate in self-care as condition permits will improve Outcome: Progressing Goal: Verbalization of feelings and concerns over difficulty with self-care will improve Outcome: Progressing Goal: Ability to communicate needs accurately will improve Outcome: Progressing   Problem: Nutrition: Goal: Risk of aspiration will decrease Outcome: Progressing Goal: Dietary intake will improve Outcome: Progressing   Problem: Education: Goal: Knowledge of General Education information will improve Description: Including pain rating scale, medication(s)/side effects and non-pharmacologic comfort measures Outcome: Progressing   Problem: Health Behavior/Discharge Planning: Goal: Ability to manage health-related  needs will improve Outcome: Progressing   Problem: Clinical Measurements: Goal: Ability to maintain clinical measurements within normal limits will improve Outcome: Progressing Goal: Will remain free from infection Outcome: Progressing Goal: Diagnostic test results will improve Outcome: Progressing Goal: Respiratory complications will improve Outcome: Progressing Goal: Cardiovascular complication will be avoided Outcome: Progressing   Problem: Activity: Goal: Risk for activity intolerance will decrease Outcome: Progressing   Problem: Nutrition: Goal: Adequate nutrition will be maintained Outcome: Progressing   Problem: Coping: Goal: Level of anxiety will decrease Outcome: Progressing   Problem: Elimination: Goal: Will not experience complications related to bowel motility Outcome: Progressing Goal: Will not experience complications related to urinary retention Outcome: Progressing   Problem: Pain Managment: Goal: General experience of comfort will improve and/or be controlled Outcome: Progressing   Problem: Safety: Goal: Ability to remain free from injury will improve Outcome: Progressing   Problem: Skin Integrity: Goal: Risk for impaired skin integrity will decrease Outcome: Progressing

## 2024-05-17 DIAGNOSIS — R5381 Other malaise: Secondary | ICD-10-CM | POA: Diagnosis not present

## 2024-05-17 DIAGNOSIS — I459 Conduction disorder, unspecified: Secondary | ICD-10-CM | POA: Diagnosis not present

## 2024-05-17 DIAGNOSIS — R001 Bradycardia, unspecified: Secondary | ICD-10-CM | POA: Diagnosis not present

## 2024-05-17 DIAGNOSIS — I639 Cerebral infarction, unspecified: Secondary | ICD-10-CM | POA: Diagnosis not present

## 2024-05-17 LAB — BASIC METABOLIC PANEL WITH GFR
Anion gap: 11 (ref 5–15)
BUN: 16 mg/dL (ref 8–23)
CO2: 20 mmol/L — ABNORMAL LOW (ref 22–32)
Calcium: 9.3 mg/dL (ref 8.9–10.3)
Chloride: 105 mmol/L (ref 98–111)
Creatinine, Ser: 1.16 mg/dL (ref 0.61–1.24)
GFR, Estimated: >60 mL/min (ref >60)
Glucose, Bld: 89 mg/dL (ref 70–99)
Potassium: 4 mmol/L (ref 3.5–5.1)
Sodium: 136 mmol/L (ref 135–145)

## 2024-05-17 MED ORDER — BREZTRI AEROSPHERE 160-9-4.8 MCG/ACT IN AERO: 2.0000 | INHALATION_SPRAY | Freq: Two times a day (BID) | RESPIRATORY_TRACT | 1 refills | Status: DC

## 2024-05-17 MED ORDER — ATORVASTATIN CALCIUM 40 MG PO TABS: 40.0000 mg | ORAL_TABLET | Freq: Every day | ORAL | 0 refills | Status: DC

## 2024-05-17 MED ORDER — IPRATROPIUM-ALBUTEROL 0.5-2.5 (3) MG/3ML IN SOLN: 3.0000 mL | Freq: Four times a day (QID) | RESPIRATORY_TRACT | 1 refills | Status: DC | PRN

## 2024-05-17 MED ORDER — ENSURE ENLIVE PO LIQD: 237.0000 mL | Freq: Two times a day (BID) | ORAL | 1 refills | Status: AC

## 2024-05-17 MED ORDER — CYANOCOBALAMIN 1000 MCG PO TABS: 1000.0000 ug | ORAL_TABLET | Freq: Every day | ORAL | 1 refills | Status: AC

## 2024-05-17 MED ORDER — DOCUSATE SODIUM 100 MG PO CAPS: 100.0000 mg | ORAL_CAPSULE | Freq: Every day | ORAL | 0 refills | Status: DC
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
## 2024-05-17 NOTE — Progress Notes (Signed)
 Nurse to nurse report called to [id number]. [facility] here to transfer patient. Spouse [facility]) called and updated.

## 2024-05-17 NOTE — Progress Notes (Signed)
 Inpatient Rehab Admissions Coordinator:    Cir following. Pt. Transferring to main campus today, will follow up once he arrives.  Leita Kleine, MS, CCC-SLP Rehab Admissions Coordinator  [phone] (celll) [phone] (office)

## 2024-05-17 NOTE — TOC Transition Note (Addendum)
 Transition of Care [facility]) - Discharge Note   Patient Details  Name: Eric [name] MRN: [id number] Date of Birth: 01/27/1946  Transition of Care [facility]) CM/SW Contact:  Eric [name] Phone Number: 05/17/2024, 11:28 AM   Clinical Narrative:     Patient is DC to [facility] for CIR. Attempted to reach spouse but no response. No ICM needs. CSW signing off.   Addendum 2:18pm   Shara is still pending for CIR- Patient is not DC until shara is approved. MD also stated that patient has no Medical need to transfer to [facility]- CIR is still following.   Final next level of care: IP Rehab Facility Barriers to Discharge: Barriers Resolved   Patient Goals and CMS Choice Patient states their goals for this hospitalization and ongoing recovery are:: CIR for rehab CMS Medicare.gov Compare Post Acute Care list provided to:: Patient Represenative (must comment) Choice offered to / list presented to : Spouse      Discharge Placement              Patient chooses bed at:  (CIR) Patient to be transferred to facility by: [facility] Name of family member notified: Eric [name] Patient and family notified of of transfer: 05/17/24  Discharge Plan and Services Additional resources added to the After Visit Summary for   In-house Referral: Clinical Social Work   Post Acute Care Choice: Durable Medical Equipment                               Social Drivers of Health (SDOH) Interventions SDOH Screenings   Food Insecurity: No Food Insecurity (05/13/2024)  Housing: Low Risk  (05/13/2024)  Transportation Needs: No Transportation Needs (05/13/2024)  Utilities: Not At Risk (05/13/2024)  Social Connections: Unknown (05/13/2024)  Tobacco Use: High Risk (05/13/2024)     Readmission Risk Interventions    05/17/2024   11:26 AM 05/17/2024    9:54 AM  Readmission Risk Prevention Plan  Medication Screening Complete Complete  Transportation Screening Complete Complete

## 2024-05-18 ENCOUNTER — Inpatient Hospital Stay (HOSPITAL_COMMUNITY)
Admission: EM | Disposition: A | Discharge status: Skilled Nursing Facility | Payer: Self-pay | Source: Home / Self Care | Attending: Internal Medicine

## 2024-05-18 DIAGNOSIS — R001 Bradycardia, unspecified: Secondary | ICD-10-CM | POA: Diagnosis not present

## 2024-05-18 DIAGNOSIS — I442 Atrioventricular block, complete: Secondary | ICD-10-CM | POA: Diagnosis not present

## 2024-05-18 DIAGNOSIS — R5381 Other malaise: Secondary | ICD-10-CM | POA: Diagnosis not present

## 2024-05-18 DIAGNOSIS — I639 Cerebral infarction, unspecified: Secondary | ICD-10-CM | POA: Diagnosis not present

## 2024-05-18 DIAGNOSIS — I459 Conduction disorder, unspecified: Secondary | ICD-10-CM | POA: Diagnosis not present

## 2024-05-18 HISTORY — PX: PACEMAKER IMPLANT: EP1218

## 2024-05-18 LAB — SURGICAL PCR SCREEN

## 2024-05-18 LAB — TSH: TSH: 4.832 u[IU]/mL — ABNORMAL HIGH (ref 0.350–4.500)

## 2024-05-18 SURGERY — PACEMAKER IMPLANT

## 2024-05-18 MED ORDER — SODIUM CHLORIDE 0.9 % IV BOLUS
500.0000 mL | Freq: Once | INTRAVENOUS | Status: AC
  Administered 2024-05-18: 500 mL via INTRAVENOUS

## 2024-05-18 MED ORDER — FENTANYL CITRATE (PF) 100 MCG/2ML IJ SOLN
INTRAMUSCULAR | Status: DC | PRN
  Administered 2024-05-18 (×4): 25 ug via INTRAVENOUS

## 2024-05-18 MED ORDER — LIDOCAINE HCL (PF) 1 % IJ SOLN
INTRAMUSCULAR | Status: AC
  Filled 2024-05-18: qty 60

## 2024-05-18 MED ORDER — MIDAZOLAM HCL 2 MG/2ML IJ SOLN
INTRAMUSCULAR | Status: AC
  Filled 2024-05-18: qty 2

## 2024-05-18 MED ORDER — CHLORHEXIDINE GLUCONATE 4 % EX SOLN: 60.0000 mL | Freq: Once | CUTANEOUS | Status: DC

## 2024-05-18 MED ORDER — SODIUM CHLORIDE 0.9 % IV SOLN: INTRAVENOUS | Status: DC

## 2024-05-18 MED ORDER — CHLORHEXIDINE GLUCONATE 4 % EX SOLN
60.0000 mL | Freq: Once | CUTANEOUS | Status: AC
  Administered 2024-05-18: 4 via TOPICAL
  Filled 2024-05-18: qty 15

## 2024-05-18 MED ORDER — SODIUM CHLORIDE 0.9 % IV SOLN
80.0000 mg | INTRAVENOUS | Status: AC
  Filled 2024-05-18: qty 2

## 2024-05-18 MED ORDER — MIDAZOLAM HCL 5 MG/5ML IJ SOLN
INTRAMUSCULAR | Status: DC | PRN
  Administered 2024-05-18 (×4): .5 mg via INTRAVENOUS

## 2024-05-18 MED ORDER — CEFAZOLIN SODIUM-DEXTROSE 2-4 GM/100ML-% IV SOLN
2.0000 g | INTRAVENOUS | Status: AC
  Filled 2024-05-18: qty 100

## 2024-05-18 MED ORDER — CEFAZOLIN SODIUM-DEXTROSE 2-4 GM/100ML-% IV SOLN
INTRAVENOUS | Status: AC
  Administered 2024-05-18: 2000 mg
  Filled 2024-05-18: qty 100

## 2024-05-18 MED ORDER — ONDANSETRON HCL 4 MG/2ML IJ SOLN: 4.0000 mg | Freq: Four times a day (QID) | INTRAMUSCULAR | Status: DC | PRN

## 2024-05-18 MED ORDER — LIDOCAINE HCL (PF) 1 % IJ SOLN
INTRAMUSCULAR | Status: DC | PRN
  Administered 2024-05-18: 30 mL

## 2024-05-18 MED ORDER — SODIUM CHLORIDE 0.9 % IV SOLN
INTRAVENOUS | Status: AC
  Administered 2024-05-18: 80 mg
  Filled 2024-05-18: qty 2

## 2024-05-18 MED ORDER — FENTANYL CITRATE (PF) 100 MCG/2ML IJ SOLN
INTRAMUSCULAR | Status: AC
  Filled 2024-05-18: qty 2

## 2024-05-18 SURGICAL SUPPLY — 14 items
CABLE SURGICAL S-101-97-12 (CABLE) ×1 IMPLANT
CATH CPS LOCATOR 3D MED (CATHETERS) IMPLANT
ELECT DEFIB PAD ADLT CADENCE (PAD) IMPLANT
LEAD ULTIPACE 52 LPA1231/52 (Lead) IMPLANT
LEAD ULTIPACE 65 LPA1231/65 (Lead) IMPLANT
PACEMAKER ASSURITY DR-RF (Pacemaker) IMPLANT
SHEATH 7FR PRELUDE SNAP 13 (SHEATH) IMPLANT
SHEATH 7FR PRELUDE SNAP 25 (SHEATH) IMPLANT
SHEATH 9FR PRELUDE SNAP 13 (SHEATH) IMPLANT
SHEATH PROBE COVER 6X72 (BAG) IMPLANT
SLITTER AGILIS HISPRO (INSTRUMENTS) IMPLANT
TOOL HELIX LOCKING (MISCELLANEOUS) IMPLANT
TRAY PACEMAKER INSERTION (PACKS) ×1 IMPLANT
WIRE HI TORQ VERSACORE-J 145CM (WIRE) IMPLANT
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
## 2024-05-18 NOTE — Plan of Care (Signed)
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
## 2024-05-18 NOTE — Discharge Instructions (Addendum)
 After Your Pacemaker   You have a [employer] Pacemaker  If you have a Medtronic or [employer]otronik device, plug in your home monitor once you get home, and no manual interaction is required.   If you have an [employer] or [employer] device, plug your home monitor once you get home, sit near the device, and press the large activation button. Sit nearby until the process is complete, usually notated by lights on the monitor.   If you were set up for monitoring using an app on your phone, make sure the app remains open in the background and the Bluetooth remains on.  ACTIVITY Do not lift your arm above shoulder height for 1 week after your procedure. After 7 days, you may progress as below.  You should remove your sling 24 hours after your procedure, unless otherwise instructed by your provider.     Tuesday May 25, 2024  Wednesday May 26, 2024 Thursday May 27, 2024 Friday May 28, 2024   Do not lift, push, pull, or carry anything over 10 pounds with the affected arm until 6 weeks (Tuesday June 29, 2024 ) after your procedure.   You may drive AFTER your wound check, unless you have been told otherwise by your provider.   Ask your healthcare provider when you can go back to work   INCISION/Dressing If you are on a blood thinner such as Coumadin, Xarelto, Eliquis, Plavix, or Pradaxa please confirm with your provider when this should be resumed.   If large square, outer bandage is left in place, this can be removed after 24 hours from your procedure. Do not remove steri-strips or glue as below.   If a PRESSURE DRESSING (a bulky dressing that usually goes up over your shoulder) was applied or left in place, please follow instructions given by your provider on when to return to have this removed.   Monitor your Pacemaker site for redness, swelling, and drainage. Call the device clinic at [phone] if you experience these symptoms or fever/chills.  If your incision is sealed  with Steri-strips or staples, you may shower 7 days after your procedure or when told by your provider. Do not remove the steri-strips or let the shower hit directly on your site. You may wash around your site with soap and water.    If you were discharged in a sling, please do not wear this during the day more than 48 hours after your surgery unless otherwise instructed. This may increase the risk of stiffness and soreness in your shoulder.   Avoid lotions, ointments, or perfumes over your incision until it is well-healed.  You may use a hot tub or a pool AFTER your wound check appointment if the incision is completely closed.  Pacemaker Alerts:  Some alerts are vibratory and others beep. These are NOT emergencies. Please call our office to let us  know. If this occurs at night or on weekends, it can wait until the next business day. Send a remote transmission.  If your device is capable of reading fluid status (for heart failure), you will be offered monthly monitoring to review this with you.   DEVICE MANAGEMENT Remote monitoring is used to monitor your pacemaker from home. This monitoring is scheduled every 91 days by our office. It allows us  to keep an eye on the functioning of your device to ensure it is working properly. You will routinely see your Electrophysiologist annually (more often if necessary).  This will appear as a REMOTE check on your  MyChart schedule. These are automatic and there is nothing for you to manually do unless otherwise instructed.  You should receive your ID card for your new device in 4-8 weeks. Keep this card with you at all times once received. Consider wearing a medical alert bracelet or necklace.  Your Pacemaker may be MRI compatible. This will be discussed at your next office visit/wound check.  You should avoid contact with strong electric or magnetic fields.   Do not use amateur (ham) radio equipment or electric (arc) welding torches. MP3 player headphones  with magnets should not be used. Some devices are safe to use if held at least 12 inches (30 cm) from your Pacemaker. These include power tools, lawn mowers, and speakers. If you are unsure if something is safe to use, ask your health care provider.  When using your cell phone, hold it to the ear that is on the opposite side from the Pacemaker. Do not leave your cell phone in a pocket over the Pacemaker.  You may safely use electric blankets, heating pads, computers, and microwave ovens.  Call the office right away if: You have chest pain. You feel more short of breath than you have felt before. You feel more light-headed than you have felt before. Your incision starts to open up.  This information is not intended to replace advice given to you by your health care provider. Make sure you discuss any questions you have with your health care provider.
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
## 2024-05-18 NOTE — Interval H&P Note (Signed)
 History and Physical Interval Note:  05/18/2024 11:34 AM  Eric [name]  has presented today for surgery, with the diagnosis of complete AV block and symptomatic bradycardia.  The various methods of treatment have been discussed with the patient and family. After consideration of risks, benefits and other options for treatment, the patient has consented to  Procedure(s): PACEMAKER IMPLANT (N/A) as a surgical intervention.  The patient's history has been reviewed, patient examined, no change in status, stable for surgery.  I have reviewed the patient's chart and labs.  Questions were answered to the patient's satisfaction.     Fonda Kitty

## 2024-05-18 NOTE — Plan of Care (Signed)
  Problem: Education: Goal: Knowledge of disease or condition will improve Outcome: Progressing   Problem: Health Behavior/Discharge Planning: Goal: Ability to manage health-related needs will improve Outcome: Progressing   Problem: Education: Goal: Knowledge of General Education information will improve Description: Including pain rating scale, medication(s)/side effects and non-pharmacologic comfort measures Outcome: Progressing

## 2024-05-19 ENCOUNTER — Encounter (HOSPITAL_COMMUNITY): Payer: Self-pay | Admitting: Cardiology

## 2024-05-19 ENCOUNTER — Inpatient Hospital Stay (HOSPITAL_COMMUNITY)

## 2024-05-19 DIAGNOSIS — E876 Hypokalemia: Secondary | ICD-10-CM

## 2024-05-19 DIAGNOSIS — I442 Atrioventricular block, complete: Secondary | ICD-10-CM | POA: Diagnosis not present

## 2024-05-19 DIAGNOSIS — I10 Essential (primary) hypertension: Secondary | ICD-10-CM | POA: Diagnosis not present

## 2024-05-19 DIAGNOSIS — N182 Chronic kidney disease, stage 2 (mild): Secondary | ICD-10-CM | POA: Insufficient documentation

## 2024-05-19 DIAGNOSIS — E785 Hyperlipidemia, unspecified: Secondary | ICD-10-CM | POA: Diagnosis not present

## 2024-05-19 MED FILL — Midazolam HCl Inj 2 MG/2ML (Base Equivalent): INTRAMUSCULAR | Qty: 2 | Status: AC
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
## 2024-05-19 NOTE — Plan of Care (Signed)
  Problem: Education: Goal: Knowledge of disease or condition will improve Outcome: Progressing Goal: Knowledge of secondary prevention will improve (MUST DOCUMENT ALL) Outcome: Progressing Goal: Knowledge of patient specific risk factors will improve (DELETE if not current risk factor) Outcome: Progressing   Problem: Ischemic Stroke/TIA Tissue Perfusion: Goal: Complications of ischemic stroke/TIA will be minimized Outcome: Progressing   Problem: Coping: Goal: Will verbalize positive feelings about self Outcome: Progressing Goal: Will identify appropriate support needs Outcome: Progressing   Problem: Health Behavior/Discharge Planning: Goal: Ability to manage health-related needs will improve Outcome: Progressing Goal: Goals will be collaboratively established with patient/family Outcome: Progressing   Problem: Self-Care: Goal: Ability to participate in self-care as condition permits will improve Outcome: Progressing Goal: Verbalization of feelings and concerns over difficulty with self-care will improve Outcome: Progressing Goal: Ability to communicate needs accurately will improve Outcome: Progressing   Problem: Nutrition: Goal: Risk of aspiration will decrease Outcome: Progressing Goal: Dietary intake will improve Outcome: Progressing   Problem: Education: Goal: Knowledge of General Education information will improve Description: Including pain rating scale, medication(s)/side effects and non-pharmacologic comfort measures Outcome: Progressing   Problem: Health Behavior/Discharge Planning: Goal: Ability to manage health-related needs will improve Outcome: Progressing   Problem: Clinical Measurements: Goal: Ability to maintain clinical measurements within normal limits will improve Outcome: Progressing Goal: Will remain free from infection Outcome: Progressing Goal: Diagnostic test results will improve Outcome: Progressing Goal: Respiratory complications will  improve Outcome: Progressing Goal: Cardiovascular complication will be avoided Outcome: Progressing   Problem: Activity: Goal: Risk for activity intolerance will decrease Outcome: Progressing   Problem: Nutrition: Goal: Adequate nutrition will be maintained Outcome: Progressing   Problem: Coping: Goal: Level of anxiety will decrease Outcome: Progressing   Problem: Elimination: Goal: Will not experience complications related to bowel motility Outcome: Progressing Goal: Will not experience complications related to urinary retention Outcome: Progressing   Problem: Pain Managment: Goal: General experience of comfort will improve and/or be controlled Outcome: Progressing   Problem: Safety: Goal: Ability to remain free from injury will improve Outcome: Progressing   Problem: Skin Integrity: Goal: Risk for impaired skin integrity will decrease Outcome: Progressing   Problem: Education: Goal: Knowledge of cardiac device and self-care will improve Outcome: Progressing Goal: Ability to safely manage health related needs after discharge will improve Outcome: Progressing Goal: Individualized Educational Video(s) Outcome: Progressing   Problem: Cardiac: Goal: Ability to achieve and maintain adequate cardiopulmonary perfusion will improve Outcome: Progressing

## 2024-05-19 NOTE — Plan of Care (Signed)
  Problem: Education: Goal: Knowledge of disease or condition will improve Outcome: Progressing   Problem: Ischemic Stroke/TIA Tissue Perfusion: Goal: Complications of ischemic stroke/TIA will be minimized Outcome: Progressing   Problem: Health Behavior/Discharge Planning: Goal: Ability to manage health-related needs will improve Outcome: Progressing   

## 2024-05-19 NOTE — Assessment & Plan Note (Signed)
 Continue blood pressure monitoring  Continue with hydrochlorothiazide 

## 2024-05-19 NOTE — Assessment & Plan Note (Signed)
 Hypokalemia. Baseline serum cr 1,2 to 1.3  Discontinue hydrochlorothiazide   At the time of discharge renal function with serum cr at 1,29 with K at 4.1 and serum bicarbonate at 18  Na 135
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
## 2024-05-19 NOTE — [place] FL2 (Signed)
 Eric [name]  MEDICAID FL2 LEVEL OF CARE FORM     IDENTIFICATION  Patient Name: Eric [name] Birthdate: Jul 25, 1946 Sex: male Admission Date (Current Location): 05/13/2024  [facility] and [place] Number:  Producer, television/film/video and Address:  [facility], [address] [place], [place] [address]      Provider Number: [id number]  Attending Physician Name and Address:  Eric [name],*  Relative Name and Phone Number:  Eric [name] (Spouse)  [phone]    Current Level of Care: Hospital Recommended Level of Care: Skilled Nursing Facility Prior Approval Number:    Date Approved/Denied:   PASRR Number: [id number]  Discharge Plan: SNF    Current Diagnoses: Patient Active Problem List   Diagnosis Date Noted   Intra-ventricular conduction delay 05/17/2024   Complete heart block (HCC) 05/14/2024   Acute ischemic stroke (HCC) 05/13/2024   Bradycardia 06/06/2020   Chronic blood loss anemia 05/01/2019   History of cerebrovascular accident (CVA) with residual deficit 05/01/2019   Hemiparesis of right dominant side due to cerebrovascular disease (HCC) 04/19/2019   Dyslipidemia 04/19/2019   Chronic constipation 04/19/2019   Vitamin B12 deficiency 04/19/2019   Stercoral ulcer of rectum 04/19/2019   Gastritis, Helicobacter pylori 04/19/2019   Iron deficiency anemia    Gastrointestinal hemorrhage    Heme positive stool 04/11/2019   Orthostatic hypotension 04/11/2019   Current smoker 04/11/2019   Hepatitis C antibody test positive 02/01/2015   Diastolic dysfunction 01/30/2015   Leucopenia 01/30/2015   Thrombocytopenia 01/30/2015   Physical debility 01/29/2015   Diaphoresis 01/29/2015   Essential hypertension 01/29/2015   Abnormal EKG 01/29/2015   Acute kidney injury (HCC) 01/29/2015   Hyperglycemia 01/29/2015   UTI (urinary tract infection), bacterial 01/29/2015   Difficulty walking 11/04/2013   Stiffness of joints, not elsewhere classified,  multiple sites 11/04/2013   Cerebral artery occlusion with cerebral infarction (HCC) 07/15/2011   Lack of coordination 07/15/2011   Symptomatic anemia 06/04/2010    Orientation RESPIRATION BLADDER Height & Weight     Self, Time, Place  O2 (2L O2 [place]) Incontinent, External catheter Weight: 173 lb 8 oz (78.7 kg) Height:  5' 6 (167.6 cm)  BEHAVIORAL SYMPTOMS/MOOD NEUROLOGICAL BOWEL NUTRITION STATUS      Incontinent Diet (Please see discharge summary)  AMBULATORY STATUS COMMUNICATION OF NEEDS Skin   Extensive Assist Verbally Other (Comment) (Wound- Scrotum)                       Personal Care Assistance Level of Assistance  Bathing, Feeding, Dressing Bathing Assistance: Maximum assistance Feeding assistance: Limited assistance Dressing Assistance: Maximum assistance     Functional Limitations Info  Sight, Hearing, Speech Sight Info: Impaired (blurry vision) Hearing Info: Adequate Speech Info: Adequate    SPECIAL CARE FACTORS FREQUENCY  PT (By licensed PT), OT (By licensed OT)     PT Frequency: 5x week OT Frequency: 5x week            Contractures Contractures Info: Not present    Additional Factors Info  Code Status, Allergies Code Status Info: DNR limited Allergies Info: NKA           Current Medications (05/19/2024):  This is the current hospital active medication list Current Facility-Administered Medications  Medication Dose Route Frequency Provider Last Rate Last Admin   acetaminophen  (TYLENOL ) tablet 650 mg  650 mg Oral Q4H PRN Darci Pore, MD   650 mg at 05/15/24 2102   Or   acetaminophen  (TYLENOL )  160 MG/5ML solution 650 mg  650 mg Per Tube Q4H PRN Darci Pore, MD       Or   acetaminophen  (TYLENOL ) suppository 650 mg  650 mg Rectal Q4H PRN Darci Pore, MD       aspirin  EC tablet 81 mg  81 mg Oral Daily Sreeram, Narendranath, MD   81 mg at 05/19/24 0828   atorvastatin  (LIPITOR) tablet 40 mg  40 mg Oral QHS Sreeram,  Narendranath, MD   40 mg at 05/18/24 2208   Chlorhexidine Gluconate Cloth 2 % PADS 6 each  6 each Topical Q0600 Darci Pore, MD   6 each at 05/19/24 0641   cyanocobalamin (VITAMIN B12) tablet 1,000 mcg  1,000 mcg Oral Daily Sreeram, Narendranath, MD   1,000 mcg at 05/19/24 9171   docusate sodium  (COLACE) capsule 100 mg  100 mg Oral Daily Sreeram, Narendranath, MD   100 mg at 05/19/24 9171   feeding supplement (ENSURE ENLIVE / ENSURE PLUS) liquid 237 mL  237 mL Oral BID BM Sreeram, Narendranath, MD   237 mL at 05/17/24 1018   ferrous sulfate  tablet 325 mg  325 mg Oral Daily Sreeram, Narendranath, MD   325 mg at 05/19/24 9171   hydrochlorothiazide  (HYDRODIURIL ) tablet 25 mg  25 mg Oral Daily Sreeram, Narendranath, MD   25 mg at 05/19/24 9171   ondansetron  (ZOFRAN ) injection 4 mg  4 mg Intravenous Q6H PRN Kennyth Chew, MD       Oral care mouth rinse  15 mL Mouth Rinse PRN Sreeram, Narendranath, MD       pantoprazole  (PROTONIX ) EC tablet 40 mg  40 mg Oral Daily Sreeram, Narendranath, MD   40 mg at 05/19/24 9171   potassium chloride  (KLOR-CON ) packet 40 mEq  40 mEq Oral Daily Darci Pore, MD   40 mEq at 05/19/24 0828   senna-docusate (Senokot-S) tablet 1 tablet  1 tablet Oral QHS PRN Sreeram, Narendranath, MD         Discharge Medications: Please see discharge summary for a list of discharge medications.  Relevant Imaging Results:  Relevant Lab Results:   Additional Information SSN # [id number]  Eric [name], LCSWA

## 2024-05-19 NOTE — Progress Notes (Signed)
 Inpatient Rehab Admissions Coordinator:    I spoke with Pt.s wife Elveria who states that she is going to be the only person home with him at d/c and is physically disabled. She prefers that he d/c to [facility] closer to home and Pt. States he wants this as well. I will notify TOC and CIR will sign off.   Leita Kleine, MS, CCC-SLP Rehab Admissions Coordinator  [phone] (celll) [phone] (office)

## 2024-05-19 NOTE — Assessment & Plan Note (Signed)
 Successful dual chamber pacemaker implant for high grade AV block

## 2024-05-19 NOTE — TOC Initial Note (Addendum)
 Transition of Care [facility]) - Initial/Assessment Note    Patient Details  Name: Eric [name] MRN: [id number] Date of Birth: [birth year]  [facility]) CM/SW Contact:    Luise JAYSON Pan, LCSWA Phone Number: 05/19/2024, 12:41 PM  Clinical Narrative:  CIR cooridnator informed CSW that patients wife wants SNF instead of CIR. Per CIR, wife wants [facility]. CSW attempted to call patients wife with no success. Per bedside RN, family is no longer in the room but will be back tomorrow. CSW to do workup and follow up with family at a later time.   3:50 PM [facility] accepted patient. Per [facility], CSW will need to submit for auth with [facility]. CSW submitted for auth and it was approved. Auth id [id number]; approval dates 05/20/2024-05/24/2024.   CSW will continue to follow.    Expected Discharge Plan: Skilled Nursing Facility Barriers to Discharge: Continued Medical Work up, SNF Pending bed offer, Insurance Authorization   Patient Goals and CMS Choice Patient states their goals for this hospitalization and ongoing recovery are:: CIR for rehab CMS Medicare.gov Compare Post Acute Care list provided to:: Patient Represenative (must comment) Choice offered to / list presented to : Spouse      Expected Discharge Plan and Services In-house Referral: Clinical Social Work   Post Acute Care Choice: Durable Medical Equipment Living arrangements for the past 2 months: Single Family Home Expected Discharge Date: 05/17/24                                    Prior Living Arrangements/Services Living arrangements for the past 2 months: Single Family Home Lives with:: Spouse Patient language and need for interpreter reviewed:: Yes Do you feel safe going back to the place where you live?: Yes      Need for Family Participation in Patient Care: Yes (Comment) Care giver support system in place?: Yes (comment) Current home services: DME Criminal Activity/Legal  Involvement Pertinent to Current Situation/Hospitalization: No - Comment as needed  Activities of Daily Living   ADL Screening (condition at time of admission) Independently performs ADLs?: Yes (appropriate for developmental age) Is the patient deaf or have difficulty hearing?: No Does the patient have difficulty seeing, even when wearing glasses/contacts?: No Does the patient have difficulty concentrating, remembering, or making decisions?: Yes  Permission Sought/Granted      Share Information with NAME: Nader and Elveria     Permission granted to share info w Relationship: Patient and spouse     Emotional Assessment Appearance:: Appears stated age Attitude/Demeanor/Rapport: Engaged Affect (typically observed): Accepting Orientation: : Oriented to Self, Oriented to Place, Oriented to  Time, Oriented to Situation Alcohol / Substance Use: Tobacco Use Psych Involvement: No (comment)  Admission diagnosis:  Bradycardia [R00.1] Acute ischemic stroke [facility]) [I63.9] Cerebrovascular accident (CVA), unspecified mechanism (HCC) [I63.9] Patient Active Problem List   Diagnosis Date Noted   Intra-ventricular conduction delay 05/17/2024   Complete heart block (HCC) 05/14/2024   Acute ischemic stroke (HCC) 05/13/2024   Bradycardia 06/06/2020   Chronic blood loss anemia 05/01/2019   History of cerebrovascular accident (CVA) with residual deficit 05/01/2019   Hemiparesis of right dominant side due to cerebrovascular disease (HCC) 04/19/2019   Dyslipidemia 04/19/2019   Chronic constipation 04/19/2019   Vitamin B12 deficiency 04/19/2019   Stercoral ulcer of rectum 04/19/2019   Gastritis, Helicobacter pylori 04/19/2019   Iron deficiency anemia    Gastrointestinal hemorrhage  Heme positive stool 04/11/2019   Orthostatic hypotension 04/11/2019   Current smoker 04/11/2019   Hepatitis C antibody test positive 02/01/2015   Diastolic dysfunction 01/30/2015   Leucopenia 01/30/2015    Thrombocytopenia 01/30/2015   Physical debility 01/29/2015   Diaphoresis 01/29/2015   Essential hypertension 01/29/2015   Abnormal EKG 01/29/2015   Acute kidney injury (HCC) 01/29/2015   Hyperglycemia 01/29/2015   UTI (urinary tract infection), bacterial 01/29/2015   Difficulty walking 11/04/2013   Stiffness of joints, not elsewhere classified, multiple sites 11/04/2013   Cerebral artery occlusion with cerebral infarction (HCC) 07/15/2011   Lack of coordination 07/15/2011   Symptomatic anemia 06/04/2010   PCP:  Charlott Harvey, MD Pharmacy:   [facility] [place], [place] - [address] [place] [address] Phone: [phone] Fax: [phone]  [facility] [address] [place] [address] [place] [address] [place] [address] [place] [address] Phone: [phone] Fax: [phone]  [facility] [place], [place] - [address] [place] [address] Phone: [phone] Fax: [phone]     Social Drivers of Health (SDOH) Social History: SDOH Screenings   Food Insecurity: No Food Insecurity (05/13/2024)  Housing: Low Risk  (05/13/2024)  Transportation Needs: No Transportation Needs (05/13/2024)  Utilities: Not At Risk (05/13/2024)  Social Connections: Unknown (05/13/2024)  Tobacco Use: High Risk (05/13/2024)   SDOH Interventions:     Readmission Risk Interventions    05/17/2024   11:26 AM 05/17/2024    9:54 AM  Readmission Risk Prevention Plan  Medication Screening Complete Complete  Transportation Screening Complete Complete

## 2024-05-19 NOTE — Assessment & Plan Note (Signed)
 Continue blood pressure control and statin  Aspirin 

## 2024-05-19 NOTE — Progress Notes (Signed)
 Mobility Specialist Progress Note:   05/19/24 1440  Mobility  Activity Pivoted/transferred from chair to bed  Level of Assistance Moderate assist, patient does 50-74% (+2)  Assistive Device Front wheel walker  Distance Ambulated (ft) 3 ft  Activity Response Tolerated well  Mobility Referral Yes  Mobility visit 1 Mobility  Mobility Specialist Start Time (ACUTE ONLY) 1440  Mobility Specialist Stop Time (ACUTE ONLY) 1455  Mobility Specialist Time Calculation (min) (ACUTE ONLY) 15 min   Pt requesting to transfer to bed. Required modA+2 to stand and pivot with RW. Needed physical cues to advance RW and to keep hand placement on grips. No c/o throughout, back in bed with all needs met. Alarm on.   Therisa Rana Mobility Specialist Please contact via SecureChat or  Rehab office at [phone]

## 2024-05-19 NOTE — Assessment & Plan Note (Signed)
 Continue with statin therapy.  ?
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
## 2024-05-20 DIAGNOSIS — D500 Iron deficiency anemia secondary to blood loss (chronic): Secondary | ICD-10-CM | POA: Diagnosis not present

## 2024-05-20 DIAGNOSIS — D696 Thrombocytopenia, unspecified: Secondary | ICD-10-CM | POA: Diagnosis not present

## 2024-05-20 DIAGNOSIS — K219 Gastro-esophageal reflux disease without esophagitis: Secondary | ICD-10-CM | POA: Diagnosis not present

## 2024-05-20 DIAGNOSIS — Z7401 Bed confinement status: Secondary | ICD-10-CM | POA: Diagnosis not present

## 2024-05-20 DIAGNOSIS — I6930 Unspecified sequelae of cerebral infarction: Secondary | ICD-10-CM | POA: Diagnosis not present

## 2024-05-20 DIAGNOSIS — R2681 Unsteadiness on feet: Secondary | ICD-10-CM | POA: Diagnosis not present

## 2024-05-20 DIAGNOSIS — N182 Chronic kidney disease, stage 2 (mild): Secondary | ICD-10-CM

## 2024-05-20 DIAGNOSIS — R404 Transient alteration of awareness: Secondary | ICD-10-CM | POA: Diagnosis not present

## 2024-05-20 DIAGNOSIS — Z48812 Encounter for surgical aftercare following surgery on the circulatory system: Secondary | ICD-10-CM | POA: Diagnosis not present

## 2024-05-20 DIAGNOSIS — I679 Cerebrovascular disease, unspecified: Secondary | ICD-10-CM | POA: Diagnosis not present

## 2024-05-20 DIAGNOSIS — G8191 Hemiplegia, unspecified affecting right dominant side: Secondary | ICD-10-CM | POA: Diagnosis not present

## 2024-05-20 DIAGNOSIS — I442 Atrioventricular block, complete: Secondary | ICD-10-CM | POA: Diagnosis not present

## 2024-05-20 DIAGNOSIS — M6281 Muscle weakness (generalized): Secondary | ICD-10-CM | POA: Diagnosis not present

## 2024-05-20 DIAGNOSIS — E785 Hyperlipidemia, unspecified: Secondary | ICD-10-CM | POA: Diagnosis not present

## 2024-05-20 DIAGNOSIS — E441 Mild protein-calorie malnutrition: Secondary | ICD-10-CM | POA: Diagnosis not present

## 2024-05-20 DIAGNOSIS — K5909 Other constipation: Secondary | ICD-10-CM | POA: Diagnosis not present

## 2024-05-20 DIAGNOSIS — R5381 Other malaise: Secondary | ICD-10-CM | POA: Diagnosis not present

## 2024-05-20 DIAGNOSIS — I10 Essential (primary) hypertension: Secondary | ICD-10-CM | POA: Diagnosis not present

## 2024-05-20 DIAGNOSIS — I110 Hypertensive heart disease with heart failure: Secondary | ICD-10-CM | POA: Diagnosis not present

## 2024-05-20 DIAGNOSIS — I459 Conduction disorder, unspecified: Secondary | ICD-10-CM | POA: Diagnosis not present

## 2024-05-20 DIAGNOSIS — I5032 Chronic diastolic (congestive) heart failure: Secondary | ICD-10-CM | POA: Diagnosis not present

## 2024-05-20 DIAGNOSIS — I951 Orthostatic hypotension: Secondary | ICD-10-CM | POA: Diagnosis not present

## 2024-05-20 LAB — BASIC METABOLIC PANEL WITH GFR
Anion gap: 9 (ref 5–15)
BUN: 18 mg/dL (ref 8–23)
CO2: 24 mmol/L (ref 22–32)
Calcium: 9 mg/dL (ref 8.9–10.3)
Chloride: 102 mmol/L (ref 98–111)
Creatinine, Ser: 1.29 mg/dL — ABNORMAL HIGH (ref 0.61–1.24)
GFR, Estimated: 57 mL/min — ABNORMAL LOW (ref >60)
Glucose, Bld: 100 mg/dL — ABNORMAL HIGH (ref 70–99)
Potassium: 4.1 mmol/L (ref 3.5–5.1)
Sodium: 135 mmol/L (ref 135–145)

## 2024-05-20 MED ORDER — AMLODIPINE BESYLATE 5 MG PO TABS: 5.0000 mg | ORAL_TABLET | Freq: Every day | ORAL | 0 refills | Status: DC

## 2024-05-20 MED ORDER — AMLODIPINE BESYLATE 5 MG PO TABS
5.0000 mg | ORAL_TABLET | Freq: Every day | ORAL | Status: DC
Start: 2024-05-21 — End: 2024-05-20

## 2024-05-20 NOTE — Progress Notes (Signed)
 Report called to [facility], nurse at the [facility].

## 2024-05-20 NOTE — TOC Transition Note (Addendum)
 Transition of Care [facility]) - Discharge Note   Patient Details  Name: Eric [name] MRN: [id number] Date of Birth: 07-06-1946  Transition of Care [facility]) CM/SW Contact:  Luise JAYSON Pan, LCSWA Phone Number: 05/20/2024, 1:01 PM   Clinical Narrative:   Patient will DC to: [facility] SNF Anticipated DC date: 05/20/24  Family notified: Elveria (spouse) Transport by: [facility]   Per MD patient ready for DC to [facility]. RN to call report prior to discharge [phone]; [phone]. Room 143 ). RN, patient, patient's family, and facility notified of DC. Discharge Summary and FL2 sent to facility. DC packet on chart. Ambulance transport requested for patient 1:03 PM.   CSW will sign off for now as social work intervention is no longer needed. Please consult us  again if new needs arise.      Final next level of care: Skilled Nursing Facility Barriers to Discharge: Barriers Resolved   Patient Goals and CMS Choice Patient states their goals for this hospitalization and ongoing recovery are:: CIR for rehab CMS Medicare.gov Compare Post Acute Care list provided to:: Patient Represenative (must comment) Choice offered to / list presented to : Spouse      Discharge Placement PASRR number recieved: 05/19/24            Patient chooses bed at: [facility] Patient to be transferred to facility by: [facility] Name of family member notified: Elveria Patient and family notified of of transfer: 05/20/24  Discharge Plan and Services Additional resources added to the After Visit Summary for   In-house Referral: Clinical Social Work   Post Acute Care Choice: Durable Medical Equipment                               Social Drivers of Health (SDOH) Interventions SDOH Screenings   Food Insecurity: No Food Insecurity (05/13/2024)  Housing: Low Risk  (05/13/2024)  Transportation Needs: No Transportation Needs (05/13/2024)  Utilities: Not At Risk (05/13/2024)  Social Connections:  Unknown (05/13/2024)  Tobacco Use: High Risk (05/13/2024)     Readmission Risk Interventions    05/17/2024   11:26 AM 05/17/2024    9:54 AM  Readmission Risk Prevention Plan  Medication Screening Complete Complete  Transportation Screening Complete Complete

## 2024-05-20 NOTE — Plan of Care (Signed)
  Problem: Ischemic Stroke/TIA Tissue Perfusion: Goal: Complications of ischemic stroke/TIA will be minimized Outcome: Progressing   Problem: Coping: Goal: Will verbalize positive feelings about self Outcome: Progressing   Problem: Health Behavior/Discharge Planning: Goal: Goals will be collaboratively established with patient/family Outcome: Progressing   Problem: Self-Care: Goal: Ability to participate in self-care as condition permits will improve Outcome: Progressing Goal: Ability to communicate needs accurately will improve Outcome: Progressing   Problem: Nutrition: Goal: Risk of aspiration will decrease Outcome: Progressing Goal: Dietary intake will improve Outcome: Progressing   Problem: Education: Goal: Knowledge of disease or condition will improve Outcome: Not Progressing Goal: Knowledge of secondary prevention will improve (MUST DOCUMENT ALL) Outcome: Not Progressing Goal: Knowledge of patient specific risk factors will improve (DELETE if not current risk factor) Outcome: Not Progressing   Problem: Coping: Goal: Will identify appropriate support needs Outcome: Not Progressing   Problem: Health Behavior/Discharge Planning: Goal: Ability to manage health-related needs will improve Outcome: Not Progressing   Problem: Self-Care: Goal: Verbalization of feelings and concerns over difficulty with self-care will improve Outcome: Not Progressing
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
## 2024-05-21 ENCOUNTER — Non-Acute Institutional Stay (SKILLED_NURSING_FACILITY): Payer: Self-pay | Admitting: Adult Health

## 2024-05-21 ENCOUNTER — Encounter: Payer: Self-pay | Admitting: Adult Health

## 2024-05-21 ENCOUNTER — Ambulatory Visit: Admitting: Cardiology

## 2024-05-21 DIAGNOSIS — I679 Cerebrovascular disease, unspecified: Secondary | ICD-10-CM

## 2024-05-21 DIAGNOSIS — D500 Iron deficiency anemia secondary to blood loss (chronic): Secondary | ICD-10-CM

## 2024-05-21 DIAGNOSIS — E441 Mild protein-calorie malnutrition: Secondary | ICD-10-CM | POA: Insufficient documentation

## 2024-05-21 DIAGNOSIS — I442 Atrioventricular block, complete: Secondary | ICD-10-CM | POA: Diagnosis not present

## 2024-05-21 DIAGNOSIS — G8191 Hemiplegia, unspecified affecting right dominant side: Secondary | ICD-10-CM

## 2024-05-21 DIAGNOSIS — I10 Essential (primary) hypertension: Secondary | ICD-10-CM

## 2024-05-21 DIAGNOSIS — E785 Hyperlipidemia, unspecified: Secondary | ICD-10-CM

## 2024-05-21 DIAGNOSIS — K5909 Other constipation: Secondary | ICD-10-CM

## 2024-05-21 DIAGNOSIS — K219 Gastro-esophageal reflux disease without esophagitis: Secondary | ICD-10-CM | POA: Insufficient documentation

## 2024-05-21 DIAGNOSIS — D696 Thrombocytopenia, unspecified: Secondary | ICD-10-CM | POA: Diagnosis not present

## 2024-05-21 NOTE — Progress Notes (Signed)
 Location:  [facility] Room Number: 143 Place of Service:  SNF (31)   CODE STATUS: full   No Known Allergies  Chief Complaint  Patient presents with   Hospitalization Follow-up    HPI:  He is a 78 year old man who has been hospitalized from 05-13-24 through 05-30-24. Medical history includes: right bbb; left anterior fascicular block; first degree AV delay; hypertension; hepatitis C; stroke and anemia. Prior to his hospitalization he had frequent falls. He arrived with bradycardia and normal 02 sat. On 05-18-24 hs dual chamber pacemaker implanted for high grade AV block. He is here for short term rehab with his goal to return back home. He will continue to be followed for his chronic illnesses including: Hemiparesis of right dominant side due to cerebrovascular disease: Complete heart block: Chronic constipation:  Past Medical History:  Diagnosis Date   Abnormal EKG 01/29/2015   RBBB and left fascicular block   Anemia 06/04/2010   Qualifier: Diagnosis of  By: Harvey MD, Sandi L    Arthritis    Cerebral artery occlusion with cerebral infarction (HCC) 07/15/2011   Constipation    Diastolic dysfunction 01/30/2015   Grade 1. EF 65-70%   Hepatitis C antibody test positive 02/01/2015   HTN (hypertension)    Leucopenia 01/30/2015   Stiffness of joints, not elsewhere classified, multiple sites 11/04/2013   Stroke (HCC)    Thrombocytopenia 01/30/2015    Past Surgical History:  Procedure Laterality Date   BIOPSY  04/16/2019   Procedure: BIOPSY;  Surgeon: Harvey Margo CROME, MD;  Location: [facility];  Service: Endoscopy;;   COLONOSCOPY  07/2010   FOR ANEMIA-DIVERTICULOSIS   COLONOSCOPY N/A 04/16/2019    rectal ulcer (due to fecal impaction) and polyps ( 2 serrated and seven simple tubular adenomas). Moderate diverticulosis in rectosigmoid, sigmoid, and descending colon.   ESOPHAGOGASTRODUODENOSCOPY  07/2010   H PYLORI GASTRITIS   ESOPHAGOGASTRODUODENOSCOPY (EGD)  WITH PROPOFOL  N/A 04/13/2019   1 benign-appearing, intrinsic moderate stenosis.  Medium sized hiatal hernia.  Mild gastritis status post biopsy. + H.pylori.   IMPACTION REMOVAL  04/14/2019   Procedure: IMPACTION REMOVAL;  Surgeon: Harvey Margo CROME, MD;  Location: [facility];  Service: Endoscopy;;  COLON   PACEMAKER IMPLANT N/A 05/18/2024   Procedure: PACEMAKER IMPLANT;  Surgeon: Kennyth Chew, MD;  Location: [facility] INVASIVE CV LAB;  Service: Cardiovascular;  Laterality: N/A;   POLYPECTOMY  04/16/2019   Procedure: POLYPECTOMY;  Surgeon: Harvey Margo CROME, MD;  Location: [facility];  Service: Endoscopy;;    Social History   Socioeconomic History   Marital status: Married    Spouse name: Not on file   Number of children: Not on file   Years of education: Not on file   Highest education level: Not on file  Occupational History   Not on file  Tobacco Use   Smoking status: Every Day    Current packs/day: 1.00    Types: Cigarettes   Smokeless tobacco: Never  Substance and Sexual Activity   Alcohol use: No   Drug use: No   Sexual activity: Never  Other Topics Concern   Not on file  Social History Narrative   MARRIED(46 YRS), KIDS: 1 SON/1 DAUGHTER PASSED (BIOLOGIC), 2 STEP KIDS. SPENDS FREE TIME: [employer]. BIBLE STUDY. WIFE DOES SHOPPING & DRIVING. [facility].   Social Drivers of Corporate investment banker Strain: Not on file  Food Insecurity: No Food Insecurity (05/13/2024)  Hunger Vital Sign    Worried About Running Out of Food in the Last Year: Never true    Ran Out of Food in the Last Year: Never true  Transportation Needs: No Transportation Needs (05/13/2024)   PRAPARE - Administrator, Civil Service (Medical): No    Lack of Transportation (Non-Medical): No  Physical Activity: Not on file  Stress: Not on file  Social Connections: Unknown (05/13/2024)   Social Connection and Isolation Panel    Frequency of Communication with Friends and Family: More  than three times a week    Frequency of Social Gatherings with Friends and Family: More than three times a week    Attends Religious Services: Not on file    Active Member of Clubs or Organizations: Not on file    Attends Banker Meetings: Not on file    Marital Status: Married  Intimate Partner Violence: Not At Risk (05/13/2024)   Humiliation, Afraid, Rape, and Kick questionnaire    Fear of Current or Ex-Partner: No    Emotionally Abused: No    Physically Abused: No    Sexually Abused: No   Family History  Problem Relation Age of Onset   Colon polyps Neg Hx    Colon cancer Neg Hx       VITAL SIGNS BP 134/85   Pulse 86   Temp 97.7 F (36.5 C)   Resp 18   Ht 5' 9 (1.753 m)   Wt 167 lb (75.8 kg)   SpO2 95%   BMI 24.66 kg/m   Outpatient Encounter Medications as of 05/21/2024  Medication Sig   amLODipine  (NORVASC ) 5 MG tablet Take 1 tablet (5 mg total) by mouth daily.   aspirin  EC 81 MG tablet Take 81 mg by mouth daily.   atorvastatin  (LIPITOR) 40 MG tablet Take 1 tablet (40 mg total) by mouth at bedtime.   cyanocobalamin 1000 MCG tablet Take 1 tablet (1,000 mcg total) by mouth daily.   feeding supplement (ENSURE ENLIVE / ENSURE PLUS) LIQD Take 237 mLs by mouth 2 (two) times daily between meals. Patient likes Chocolate   ferrous sulfate  325 (65 FE) MG tablet Take 1 tablet (325 mg total) by mouth daily.   NON FORMULARY Diet - Liquids: __X_Regular;  Diet: Regular, NAS, Consistent Carbohydrate   omeprazole (PRILOSEC) 40 MG capsule Take 40 mg by mouth daily.   senna-docusate (SENOKOT-S) 8.6-50 MG tablet Take 1 tablet by mouth daily.   [DISCONTINUED] docusate sodium  (COLACE) 100 MG capsule Take 1 capsule (100 mg total) by mouth daily.   [DISCONTINUED] pantoprazole  (PROTONIX ) 40 MG tablet Take 1 tablet (40 mg total) by mouth daily. 30 minutes before breakfast   No facility-administered encounter medications on file as of 05/21/2024.     SIGNIFICANT DIAGNOSTIC  EXAMS  LABS  05-12-24: hgb A1c 5.8 05-13-24; wbc 7.3; hgb 15.1; hct 47.0 mcv 95.9 plt 148; glucose 115; bun 16; creat 1.31; k+ 3.8; na++ 141; ca 8.7; gfr 56; protein 5.7 albumin 4.0 05-14-24: chol 139; ldl 72; trig 62 hdl 55   Review of Systems  Constitutional:  Negative for malaise/fatigue.  Respiratory:  Negative for cough and shortness of breath.   Cardiovascular:  Negative for chest pain, palpitations and leg swelling.  Gastrointestinal:  Negative for abdominal pain, constipation and heartburn.  Musculoskeletal:  Negative for back pain, joint pain and myalgias.  Skin: Negative.   Neurological:  Negative for dizziness.  Psychiatric/Behavioral:  The patient is not nervous/anxious.  Physical Exam Constitutional:      General: He is not in acute distress.    Appearance: He is well-developed. He is not diaphoretic.  Neck:     Thyroid: No thyromegaly.  Cardiovascular:     Rate and Rhythm: Normal rate and regular rhythm.     Heart sounds: Normal heart sounds.  Pulmonary:     Effort: Pulmonary effort is normal. No respiratory distress.     Breath sounds: Normal breath sounds.  Abdominal:     General: Bowel sounds are normal. There is no distension.     Palpations: Abdomen is soft.     Tenderness: There is no abdominal tenderness.  Musculoskeletal:        General: Normal range of motion.     Cervical back: Neck supple.  Lymphadenopathy:     Cervical: No cervical adenopathy.  Skin:    General: Skin is warm and dry.  Neurological:     Mental Status: He is alert. Mental status is at baseline.  Psychiatric:        Mood and Affect: Mood normal.      ASSESSMENT/ PLAN:  TODAY  Hemiparesis of right dominant side due to cerebrovascular disease: asa 81 mg daily   2. Complete heart block: is status post pacemaker  3. Chronic constipation: will continue senna s daily  4. Thrombocytopenia: plt 148  5. Iron deficiency anemia due to chronic blood loss: hgb 15.1; is on iron  daily   6. Essential hypertension: b/p 134/85 will continue asa 81 mg daily norvasc  5 mg daily  7. GERD without esophagitis: will continue prilosec 40 mg daily   8. Dyslipidemia: will continue lipitor 40 mg daily   9. Protein calorie malnutrition mild: is on ensure twice daily    Barnie Seip NP [facility]   call [phone]

## 2024-05-24 ENCOUNTER — Encounter: Payer: Self-pay | Admitting: Internal Medicine

## 2024-05-24 ENCOUNTER — Non-Acute Institutional Stay (SKILLED_NURSING_FACILITY): Payer: Self-pay | Admitting: Internal Medicine

## 2024-05-24 DIAGNOSIS — G8191 Hemiplegia, unspecified affecting right dominant side: Secondary | ICD-10-CM

## 2024-05-24 DIAGNOSIS — I679 Cerebrovascular disease, unspecified: Secondary | ICD-10-CM

## 2024-05-24 DIAGNOSIS — R5381 Other malaise: Secondary | ICD-10-CM

## 2024-05-24 DIAGNOSIS — I442 Atrioventricular block, complete: Secondary | ICD-10-CM | POA: Diagnosis not present

## 2024-05-24 DIAGNOSIS — D696 Thrombocytopenia, unspecified: Secondary | ICD-10-CM

## 2024-05-24 NOTE — Assessment & Plan Note (Signed)
 Strength to opposition seems good and overall symmetric; but he had difficulty following commands and effort was markedly variable.

## 2024-05-24 NOTE — Assessment & Plan Note (Addendum)
 Strength to opposition seems to be essentially symmetric and strong but his effort was variable.  He had difficulty following commands.  His wife states that PTA he had been ambulating 3 times a day from room to room employing a cane. PT/OT at SNF as tolerated.

## 2024-05-24 NOTE — Patient Instructions (Signed)
 See assessment and plan under each diagnosis in the problem list and acutely for this visit
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
## 2024-05-24 NOTE — Assessment & Plan Note (Signed)
 Current platelet count is 148,000.  No bleeding dyscrasias reported.  Continue to monitor.

## 2024-05-26 NOTE — Assessment & Plan Note (Signed)
 Ace rhythm w/o dysrrhythmia on exam.

## 2024-06-01 ENCOUNTER — Ambulatory Visit: Attending: Internal Medicine

## 2024-06-01 NOTE — Patient Instructions (Incomplete)
   After Your Pacemaker   Monitor your pacemaker site for redness, swelling, and drainage. Call the device clinic at [phone] if you experience these symptoms or fever/chills.  Your incision was closed with Steri-strips or staples:  You may shower 7 days after your procedure and wash your incision with soap and water. Avoid lotions, ointments, or perfumes over your incision until it is well-healed.  You may use a hot tub or a pool after your wound check appointment if the incision is completely closed.  Do not lift, push or pull greater than 10 pounds with the affected arm until 6 weeks after your procedure. UNTIL AFTER NOVEMBER 25TH. There are no other restrictions in arm movement after your wound check appointment.  You may drive, unless driving has been restricted by your healthcare providers.  Remote monitoring is used to monitor your pacemaker from home. This monitoring is scheduled every 91 days by our office. It allows us  to keep an eye on the functioning of your device to ensure it is working properly. You will routinely see your Electrophysiologist annually (more often if necessary).

## 2024-06-02 ENCOUNTER — Non-Acute Institutional Stay (SKILLED_NURSING_FACILITY): Payer: Self-pay | Admitting: Adult Health

## 2024-06-02 ENCOUNTER — Encounter: Payer: Self-pay | Admitting: Adult Health

## 2024-06-02 DIAGNOSIS — I459 Conduction disorder, unspecified: Secondary | ICD-10-CM | POA: Diagnosis not present

## 2024-06-02 DIAGNOSIS — N182 Chronic kidney disease, stage 2 (mild): Secondary | ICD-10-CM | POA: Diagnosis not present

## 2024-06-02 DIAGNOSIS — I442 Atrioventricular block, complete: Secondary | ICD-10-CM | POA: Diagnosis not present

## 2024-06-02 DIAGNOSIS — I679 Cerebrovascular disease, unspecified: Secondary | ICD-10-CM

## 2024-06-02 DIAGNOSIS — G8191 Hemiplegia, unspecified affecting right dominant side: Secondary | ICD-10-CM

## 2024-06-02 NOTE — Progress Notes (Signed)
 Location:  [facility] Room Number: 143 P Place of Service:  SNF (31)   CODE STATUS: Full Code  No Known Allergies  Chief Complaint  Patient presents with   Discharge to home    HPI:  He is being discharged to home with home health for pt/ot. He will need a wheelchair; prescriptions written and will need to follow up with his pcp. He had been hospitalized for multiple falls bradycardia, required a pace maker placement. He was admitted to this facility for short term rehab. Therapy: ambulate 30 feet with hemiwalker and min assist; upper body and lower body max assist.brp: max assist.   Past Medical History:  Diagnosis Date   Abnormal EKG 01/29/2015   RBBB and left fascicular block   Anemia 06/04/2010   Qualifier: Diagnosis of  By: Harvey MD, Sandi L    Arthritis    Cerebral artery occlusion with cerebral infarction (HCC) 07/15/2011   Constipation    Diastolic dysfunction 01/30/2015   Grade 1. EF 65-70%   Hepatitis C antibody test positive 02/01/2015   HTN (hypertension)    Leucopenia 01/30/2015   Stiffness of joints, not elsewhere classified, multiple sites 11/04/2013   Stroke [facility])    Thrombocytopenia 01/30/2015    Past Surgical History:  Procedure Laterality Date   BIOPSY  04/16/2019   Procedure: BIOPSY;  Surgeon: Harvey Margo CROME, MD;  Location: [facility];  Service: Endoscopy;;   COLONOSCOPY  07/2010   FOR ANEMIA-DIVERTICULOSIS   COLONOSCOPY N/A 04/16/2019    rectal ulcer (due to fecal impaction) and polyps ( 2 serrated and seven simple tubular adenomas). Moderate diverticulosis in rectosigmoid, sigmoid, and descending colon.   ESOPHAGOGASTRODUODENOSCOPY  07/2010   H PYLORI GASTRITIS   ESOPHAGOGASTRODUODENOSCOPY (EGD) WITH PROPOFOL  N/A 04/13/2019   1 benign-appearing, intrinsic moderate stenosis.  Medium sized hiatal hernia.  Mild gastritis status post biopsy. + H.pylori.   IMPACTION REMOVAL  04/14/2019   Procedure: IMPACTION REMOVAL;  Surgeon: Harvey Margo CROME, MD;  Location: [facility];  Service: Endoscopy;;  COLON   PACEMAKER IMPLANT N/A 05/18/2024   Procedure: PACEMAKER IMPLANT;  Surgeon: Kennyth Chew, MD;  Location: [facility] INVASIVE CV LAB;  Service: Cardiovascular;  Laterality: N/A;   POLYPECTOMY  04/16/2019   Procedure: POLYPECTOMY;  Surgeon: Harvey Margo CROME, MD;  Location: [facility];  Service: Endoscopy;;    Social History   Socioeconomic History   Marital status: Married    Spouse name: Not on file   Number of children: Not on file   Years of education: Not on file   Highest education level: Not on file  Occupational History   Not on file  Tobacco Use   Smoking status: Every Day    Current packs/day: 1.00    Types: Cigarettes   Smokeless tobacco: Never  Substance and Sexual Activity   Alcohol use: No   Drug use: No   Sexual activity: Never  Other Topics Concern   Not on file  Social History Narrative   MARRIED(46 YRS), KIDS: 1 SON/1 DAUGHTER PASSED (BIOLOGIC), 2 STEP KIDS. SPENDS FREE TIME: [employer]. BIBLE STUDY. WIFE DOES SHOPPING & DRIVING. [facility].   Social Drivers of Corporate Investment Banker Strain: Not on file  Food Insecurity: No Food Insecurity (05/13/2024)   Hunger Vital Sign    Worried About Running Out of Food in the Last Year: Never true    Ran Out of Food in the Last Year: Never  true  Transportation Needs: No Transportation Needs (05/13/2024)   PRAPARE - Administrator, Civil Service (Medical): No    Lack of Transportation (Non-Medical): No  Physical Activity: Not on file  Stress: Not on file  Social Connections: Unknown (05/13/2024)   Social Connection and Isolation Panel    Frequency of Communication with Friends and Family: More than three times a week    Frequency of Social Gatherings with Friends and Family: More than three times a week    Attends Religious Services: Not on file    Active Member of Clubs or Organizations: Not on file    Attends Tax Inspector Meetings: Not on file    Marital Status: Married  Intimate Partner Violence: Not At Risk (05/13/2024)   Humiliation, Afraid, Rape, and Kick questionnaire    Fear of Current or Ex-Partner: No    Emotionally Abused: No    Physically Abused: No    Sexually Abused: No   Family History  Problem Relation Age of Onset   Colon polyps Neg Hx    Colon cancer Neg Hx       VITAL SIGNS BP 119/72   Pulse 80   Temp (!) 97 F (36.1 C)   Resp 18   Ht 5' 9 (1.753 m)   Wt 172 lb 6.4 oz (78.2 kg)   SpO2 100%   BMI 25.46 kg/m   Outpatient Encounter Medications as of 06/02/2024  Medication Sig   amLODipine  (NORVASC ) 5 MG tablet Take 1 tablet (5 mg total) by mouth daily.   aspirin  EC 81 MG tablet Take 81 mg by mouth daily.   atorvastatin  (LIPITOR) 40 MG tablet Take 1 tablet (40 mg total) by mouth at bedtime.   cyanocobalamin 1000 MCG tablet Take 1 tablet (1,000 mcg total) by mouth daily.   feeding supplement (ENSURE ENLIVE / ENSURE PLUS) LIQD Take 237 mLs by mouth 2 (two) times daily between meals. Patient likes Chocolate   ferrous sulfate  325 (65 FE) MG tablet Take 1 tablet (325 mg total) by mouth daily.   NON FORMULARY Diet - Liquids: __X_Regular;  Diet: Regular, NAS, Consistent Carbohydrate   omeprazole (PRILOSEC) 40 MG capsule Take 40 mg by mouth daily.   senna-docusate (SENOKOT-S) 8.6-50 MG tablet Take 1 tablet by mouth daily.   No facility-administered encounter medications on file as of 06/02/2024.     SIGNIFICANT DIAGNOSTIC EXAMS  LABS  05-12-24: hgb A1c 5.8 05-13-24; wbc 7.3; hgb 15.1; hct 47.0 mcv 95.9 plt 148; glucose 115; bun 16; creat 1.31; k+ 3.8; na++ 141; ca 8.7; gfr 56; protein 5.7 albumin 4.0 05-14-24: chol 139; ldl 72; trig 62 hdl 55  Review of Systems  Constitutional:  Negative for malaise/fatigue.  Respiratory:  Negative for cough and shortness of breath.   Cardiovascular:  Negative for chest pain, palpitations and leg swelling.  Gastrointestinal:   Negative for abdominal pain, constipation and heartburn.  Musculoskeletal:  Negative for back pain, joint pain and myalgias.  Skin: Negative.   Neurological:  Negative for dizziness.  Psychiatric/Behavioral:  The patient is not nervous/anxious.     Physical Exam Constitutional:      General: He is not in acute distress.    Appearance: He is well-developed. He is not diaphoretic.  Neck:     Thyroid: No thyromegaly.  Cardiovascular:     Rate and Rhythm: Normal rate and regular rhythm.     Heart sounds: Normal heart sounds.  Pulmonary:     Effort: Pulmonary  effort is normal. No respiratory distress.     Breath sounds: Normal breath sounds.  Abdominal:     General: Bowel sounds are normal. There is no distension.     Palpations: Abdomen is soft.     Tenderness: There is no abdominal tenderness.  Musculoskeletal:        General: Normal range of motion.     Cervical back: Neck supple.     Right lower leg: No edema.     Left lower leg: No edema.  Lymphadenopathy:     Cervical: No cervical adenopathy.  Skin:    General: Skin is warm and dry.  Neurological:     Mental Status: He is alert. Mental status is at baseline.  Psychiatric:        Mood and Affect: Mood normal.       ASSESSMENT/ PLAN:   Patient is being discharged with the following home health services:  pt/ot to evaluate and treat as indicated for gait balance strength adl training   Patient is being discharged with the following durable medical equipment:  wheelchair  Patient has been advised to f/u with their PCP in 1-2 weeks to for a transitions of care visit.  Social services at their facility was responsible for arranging this appointment.  Pt was provided with adequate prescriptions of noncontrolled medications to reach the scheduled appointment .  For controlled substances, a limited supply was provided as appropriate for the individual patient.  If the pt normally receives these medications from a pain clinic  or has a contract with another physician, these medications should be received from that clinic or physician only).    A 30 day supply of his prescription medications have been sent to [facility]  Time spent with patient: 40 minutes: medications; dme; home health.   Barnie Seip NP [facility]   call [phone]

## 2024-06-08 ENCOUNTER — Other Ambulatory Visit: Payer: Self-pay | Admitting: Adult Health

## 2024-06-08 MED ORDER — AMLODIPINE BESYLATE 5 MG PO TABS: 5.0000 mg | ORAL_TABLET | Freq: Every day | ORAL | 0 refills | Status: AC

## 2024-06-08 MED ORDER — FERROUS SULFATE 325 (65 FE) MG PO TABS: 325.0000 mg | ORAL_TABLET | Freq: Every day | ORAL | 0 refills | Status: AC

## 2024-06-08 MED ORDER — OMEPRAZOLE 40 MG PO CPDR: 40.0000 mg | DELAYED_RELEASE_CAPSULE | Freq: Every day | ORAL | 0 refills | Status: AC

## 2024-06-08 MED ORDER — ATORVASTATIN CALCIUM 40 MG PO TABS: 40.0000 mg | ORAL_TABLET | Freq: Every day | ORAL | 0 refills | Status: AC

## 2024-06-30 ENCOUNTER — Ambulatory Visit: Attending: Cardiology

## 2024-06-30 DIAGNOSIS — I442 Atrioventricular block, complete: Secondary | ICD-10-CM | POA: Diagnosis not present

## 2024-07-02 LAB — CUP PACEART REMOTE DEVICE CHECK
Battery Remaining Longevity: 122 mo
Battery Remaining Percentage: 95.5 %
Battery Voltage: 3.04 V
Brady Statistic AP VP Percent: 2.6 %
Brady Statistic AP VS Percent: 1 %
Brady Statistic AS VP Percent: 95 %
Brady Statistic AS VS Percent: 1.7 %
Brady Statistic RA Percent Paced: 1.8 %
Brady Statistic RV Percent Paced: 97 %
Date Time Interrogation Session: 20251126020014
Implantable Lead Connection Status: 753985
Implantable Lead Connection Status: 753985
Implantable Lead Implant Date: 20251014
Implantable Lead Implant Date: 20251014
Implantable Lead Location: 753859
Implantable Lead Location: 753860
Implantable Pulse Generator Implant Date: 20251014
Lead Channel Impedance Value: 410 Ohm
Lead Channel Impedance Value: 430 Ohm
Lead Channel Pacing Threshold Amplitude: 0.75 V
Lead Channel Pacing Threshold Amplitude: 1 V
Lead Channel Pacing Threshold Pulse Width: 0.4 ms
Lead Channel Pacing Threshold Pulse Width: 0.4 ms
Lead Channel Sensing Intrinsic Amplitude: 12 mV
Lead Channel Sensing Intrinsic Amplitude: 2.3 mV
Lead Channel Setting Pacing Amplitude: 1 V
Lead Channel Setting Pacing Amplitude: 2 V
Lead Channel Setting Pacing Pulse Width: 0.4 ms
Lead Channel Setting Sensing Sensitivity: 4 mV
Pulse Gen Model: 2272
Pulse Gen Serial Number: 5282211

## 2024-07-04 DIAGNOSIS — R2681 Unsteadiness on feet: Secondary | ICD-10-CM | POA: Diagnosis not present

## 2024-07-05 NOTE — Progress Notes (Signed)
 Remote PPM Transmission

## 2024-07-12 ENCOUNTER — Telehealth: Payer: Self-pay | Admitting: *Deleted

## 2024-07-12 NOTE — Telephone Encounter (Signed)
 Called [place] with [facility] and refused verbal orders due to patient being Discharged from [place].

## 2024-07-12 NOTE — Telephone Encounter (Signed)
 Copied from CRM #[id number]. Topic: Clinical - Home Health Verbal Orders >> Jul 12, 2024  3:38 PM Zane F wrote: Request for Eric Seip NP:   Caller/Agency: [name]  Callback Number: [phone]  Calling From: [facility]  Service Requested: Occupational Therapy Frequency: 1 week 4  Any new concerns about the patient? No

## 2024-07-23 ENCOUNTER — Ambulatory Visit: Payer: Self-pay | Admitting: Cardiology

## 2024-08-25 ENCOUNTER — Ambulatory Visit: Admitting: Neurology

## 2024-08-25 ENCOUNTER — Telehealth: Payer: Self-pay | Admitting: Cardiology

## 2024-08-25 NOTE — Telephone Encounter (Signed)
 Spoke w/ patients wife Elveria regarding rescheduling patient from his 1/29 appointment with Dr. Kennyth due to a schedule change.   Elveria states that both she and patient have had strokes and can't drive to [facility] to see Dr. Kennyth so she would like for patient to be switched to [facility] office to see Dr. Kennyth.

## 2024-09-02 ENCOUNTER — Ambulatory Visit: Admitting: Cardiology

## 2024-09-29 ENCOUNTER — Ambulatory Visit

## 2024-12-29 ENCOUNTER — Ambulatory Visit

## 2025-03-30 ENCOUNTER — Ambulatory Visit

## 2025-06-29 ENCOUNTER — Ambulatory Visit
